# Patient Record
Sex: Male | Born: 1935 | Race: White | Hispanic: No | Marital: Married | Smoking: Never smoker
Health system: Southern US, Community
[De-identification: ages and names within clinical notes are randomized; demographics above are authoritative.]

## PROBLEM LIST (undated history)

## (undated) DIAGNOSIS — I10 Essential (primary) hypertension: Secondary | ICD-10-CM

## (undated) DIAGNOSIS — C801 Malignant (primary) neoplasm, unspecified: Secondary | ICD-10-CM

## (undated) DIAGNOSIS — I2510 Atherosclerotic heart disease of native coronary artery without angina pectoris: Secondary | ICD-10-CM

## (undated) DIAGNOSIS — E119 Type 2 diabetes mellitus without complications: Secondary | ICD-10-CM

## (undated) DIAGNOSIS — D649 Anemia, unspecified: Secondary | ICD-10-CM

## (undated) DIAGNOSIS — N189 Chronic kidney disease, unspecified: Secondary | ICD-10-CM

## (undated) HISTORY — PX: CAROTID ENDARTERECTOMY: SUR193

---

## 1993-01-20 DIAGNOSIS — J189 Pneumonia, unspecified organism: Secondary | ICD-10-CM

## 1993-01-20 DIAGNOSIS — I219 Acute myocardial infarction, unspecified: Secondary | ICD-10-CM

## 1993-01-20 HISTORY — DX: Pneumonia, unspecified organism: J18.9

## 1993-01-20 HISTORY — DX: Acute myocardial infarction, unspecified: I21.9

## 1993-01-20 HISTORY — PX: CORONARY ARTERY BYPASS GRAFT: SHX141

## 2004-10-09 ENCOUNTER — Ambulatory Visit (HOSPITAL_COMMUNITY): Admission: RE | Admit: 2004-10-09 | Discharge: 2004-10-09 | Payer: Self-pay | Admitting: Neurosurgery

## 2005-03-26 ENCOUNTER — Ambulatory Visit (HOSPITAL_COMMUNITY): Admission: RE | Admit: 2005-03-26 | Discharge: 2005-03-26 | Payer: Self-pay | Admitting: Neurosurgery

## 2005-03-28 ENCOUNTER — Ambulatory Visit (HOSPITAL_COMMUNITY): Admission: RE | Admit: 2005-03-28 | Discharge: 2005-03-28 | Payer: Self-pay | Admitting: Neurosurgery

## 2005-04-03 ENCOUNTER — Inpatient Hospital Stay (HOSPITAL_COMMUNITY): Admission: RE | Admit: 2005-04-03 | Discharge: 2005-04-07 | Payer: Self-pay | Admitting: Neurosurgery

## 2005-04-05 ENCOUNTER — Ambulatory Visit: Payer: Self-pay | Admitting: Emergency Medicine

## 2005-12-05 ENCOUNTER — Encounter: Admission: RE | Admit: 2005-12-05 | Discharge: 2005-12-05 | Payer: Self-pay | Admitting: Neurosurgery

## 2007-05-25 IMAGING — CR DG CHEST 2V
2 series · 2 of 2 positions shown · non-contrast
Comparison: none

CLINICAL DATA: Spondylosis

Chest 2 view:
No previous for comparison. Prominent interstitial markings in the lung bases.
Changes of CABG. No confluent airspace infiltrate. Mild apparent enlargement of
cardiac silhouette. No effusion. Tortuous thoracic aorta.
Pressure:
1. Mild cardiomegaly.
2. Changes of CABG.

[view not recorded (1 of 2)]
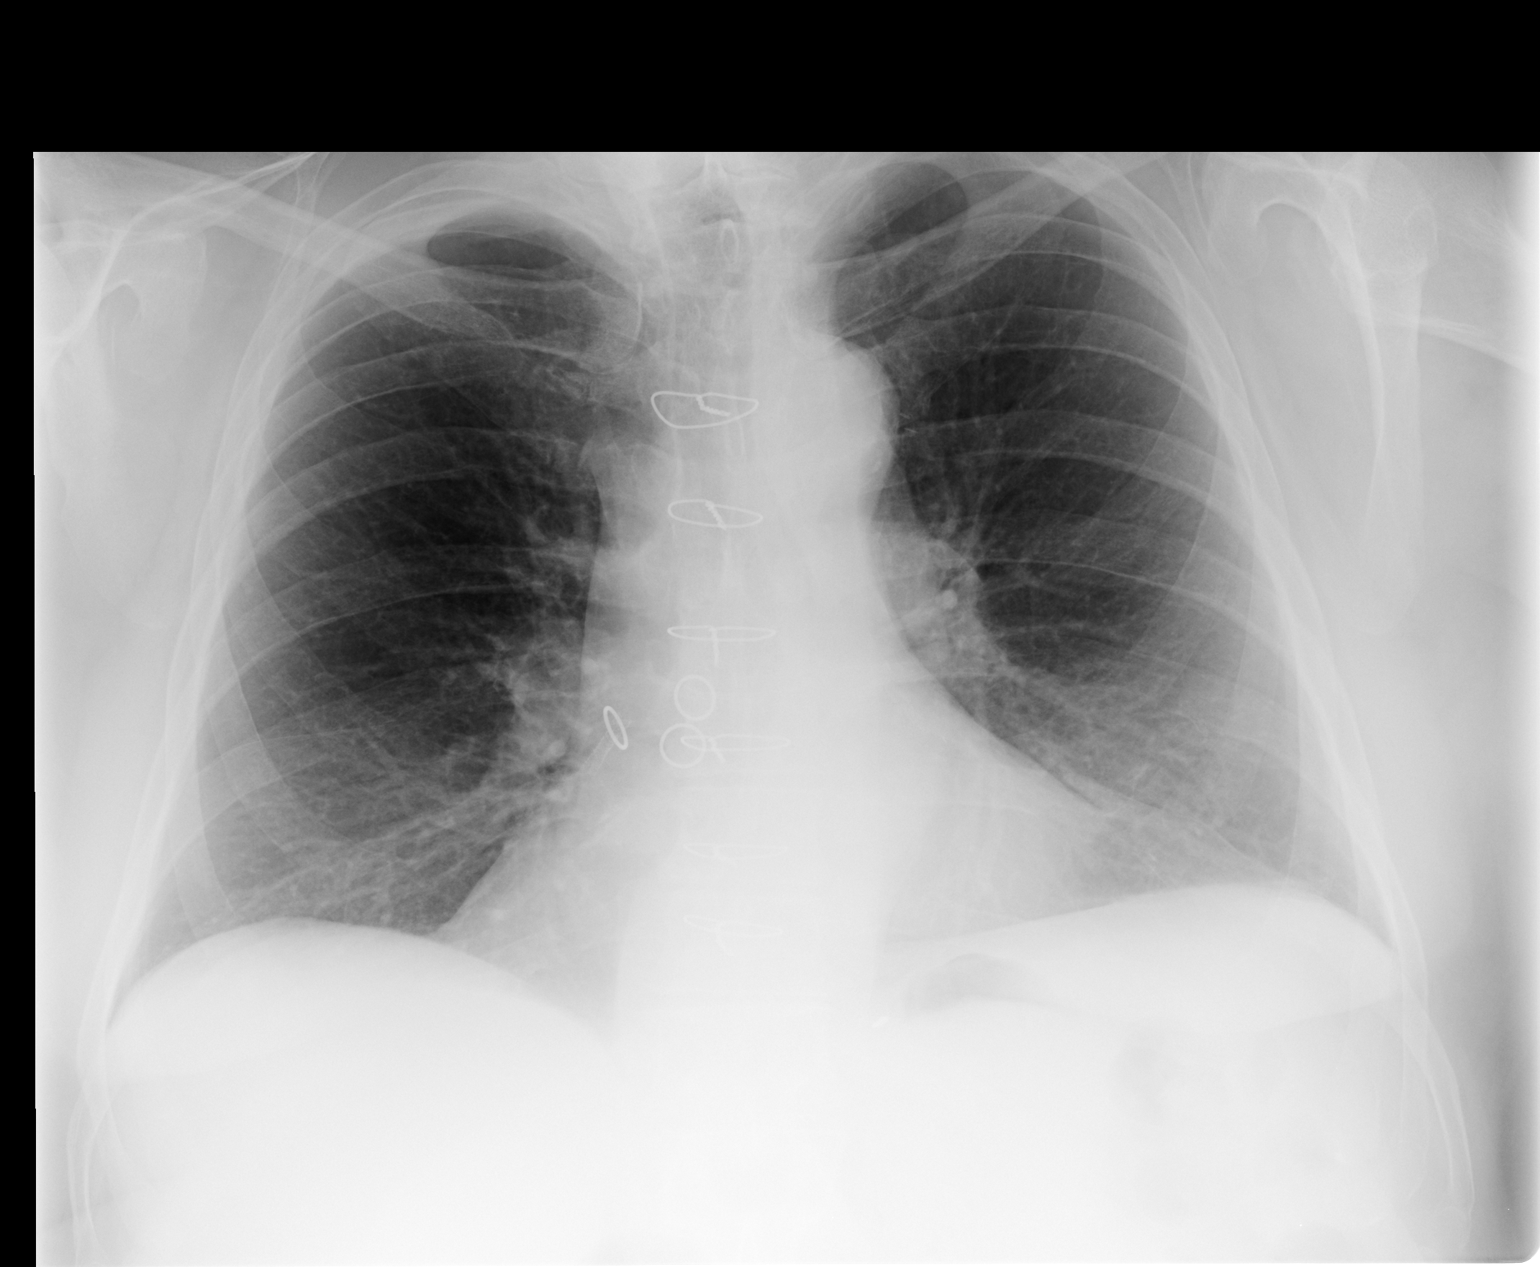

[view not recorded (2 of 2)]
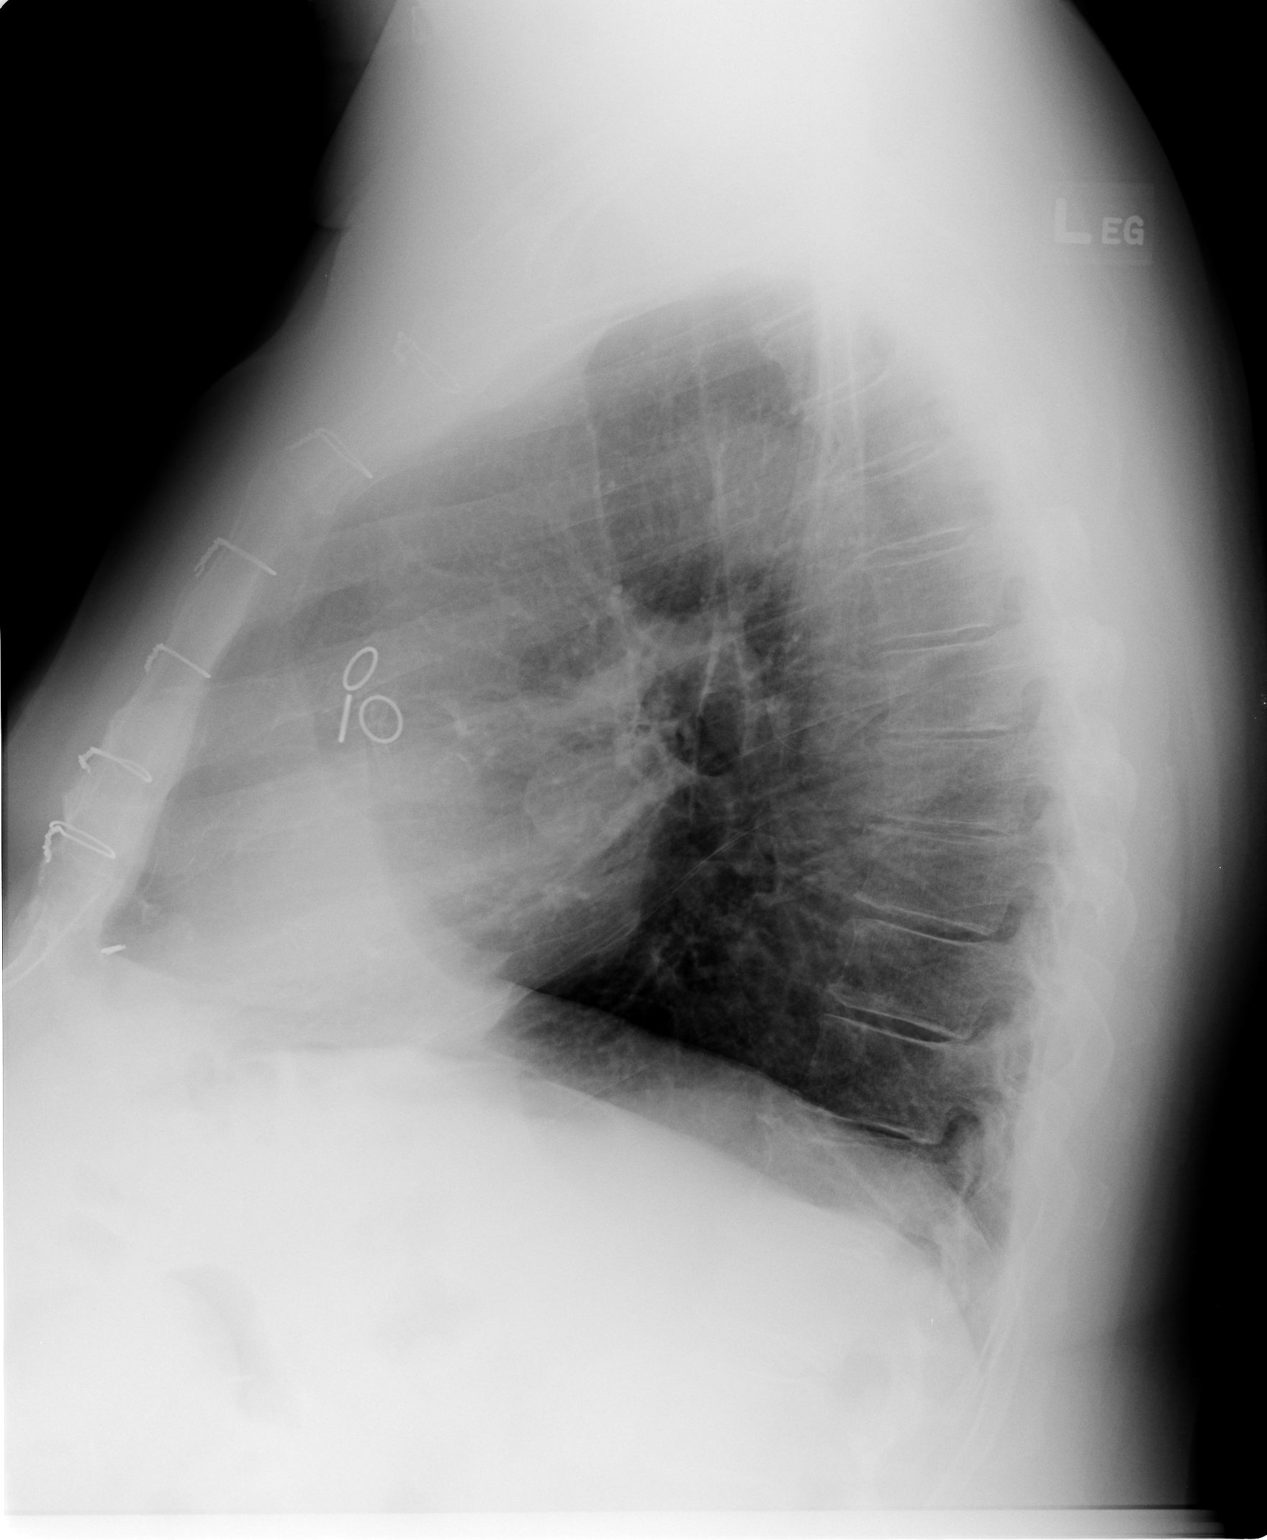

[2 of 2 positions shown; findings below may reference images not displayed]

## 2010-01-20 DIAGNOSIS — G4730 Sleep apnea, unspecified: Secondary | ICD-10-CM | POA: Diagnosis present

## 2010-01-20 HISTORY — PX: BACK SURGERY: SHX140

## 2010-01-20 HISTORY — DX: Sleep apnea, unspecified: G47.30

## 2010-01-20 HISTORY — PX: CORONARY ANGIOPLASTY: SHX604

## 2014-05-17 ENCOUNTER — Other Ambulatory Visit: Payer: Self-pay | Admitting: Neurosurgery

## 2014-05-17 DIAGNOSIS — Z981 Arthrodesis status: Secondary | ICD-10-CM

## 2014-05-31 ENCOUNTER — Ambulatory Visit
Admission: RE | Admit: 2014-05-31 | Discharge: 2014-05-31 | Disposition: A | Discharge status: Home / Self Care | Payer: Medicare Other | Source: Ambulatory Visit | Attending: Neurosurgery | Admitting: Neurosurgery

## 2014-05-31 DIAGNOSIS — Z981 Arthrodesis status: Secondary | ICD-10-CM

## 2016-01-21 DIAGNOSIS — I209 Angina pectoris, unspecified: Secondary | ICD-10-CM

## 2016-01-21 HISTORY — PX: EYE SURGERY: SHX253

## 2016-01-21 HISTORY — DX: Angina pectoris, unspecified: I20.9

## 2019-05-19 MED ORDER — FLUCONAZOLE 50 MG PO TABS
100.00 | ORAL_TABLET | ORAL | Status: DC
Start: 2019-05-19 — End: 2019-05-19

## 2019-05-19 MED ORDER — CYANOCOBALAMIN 1000 MCG PO TABS
1000.00 | ORAL_TABLET | ORAL | Status: DC
Start: 2019-05-19 — End: 2019-05-19

## 2019-05-19 MED ORDER — INSULIN LISPRO 100 UNIT/ML ~~LOC~~ SOLN
1.00 | SUBCUTANEOUS | Status: DC
Start: 2019-05-18 — End: 2019-05-19

## 2019-05-19 MED ORDER — DEXTROSE 10 % IV SOLN
125.00 | INTRAVENOUS | Status: DC
Start: ? — End: 2019-05-19

## 2019-05-19 MED ORDER — FLUTICASONE FUROATE-VILANTEROL 100-25 MCG/INH IN AEPB
1.00 | INHALATION_SPRAY | RESPIRATORY_TRACT | Status: DC
Start: 2019-05-19 — End: 2019-05-19

## 2019-05-19 MED ORDER — ISOSORBIDE MONONITRATE ER 30 MG PO TB24
30.00 | ORAL_TABLET | ORAL | Status: DC
Start: 2019-05-19 — End: 2019-05-19

## 2019-05-19 MED ORDER — GENERIC EXTERNAL MEDICATION
Status: DC
Start: ? — End: 2019-05-19

## 2019-05-19 MED ORDER — GABAPENTIN 300 MG PO CAPS
300.00 | ORAL_CAPSULE | ORAL | Status: DC
Start: 2019-05-19 — End: 2019-05-19

## 2019-05-19 MED ORDER — NITROGLYCERIN 0.4 MG SL SUBL
0.40 | SUBLINGUAL_TABLET | SUBLINGUAL | Status: DC
Start: ? — End: 2019-05-19

## 2019-05-19 MED ORDER — ACETAMINOPHEN 325 MG PO TABS
650.00 | ORAL_TABLET | ORAL | Status: DC
Start: ? — End: 2019-05-19

## 2019-05-19 MED ORDER — GLUCOSE 40 % PO GEL
15.00 | ORAL | Status: DC
Start: ? — End: 2019-05-19

## 2019-05-19 MED ORDER — PRAVASTATIN SODIUM 40 MG PO TABS
40.00 | ORAL_TABLET | ORAL | Status: DC
Start: 2019-05-18 — End: 2019-05-19

## 2019-05-19 MED ORDER — METOPROLOL SUCCINATE ER 50 MG PO TB24
50.00 | ORAL_TABLET | ORAL | Status: DC
Start: 2019-05-19 — End: 2019-05-19

## 2019-05-19 MED ORDER — DULOXETINE HCL 30 MG PO CPEP
60.00 | ORAL_CAPSULE | ORAL | Status: DC
Start: 2019-05-19 — End: 2019-05-19

## 2019-05-19 MED ORDER — FLUTICASONE PROPIONATE 50 MCG/ACT NA SUSP
1.00 | NASAL | Status: DC
Start: 2019-05-18 — End: 2019-05-19

## 2019-05-19 MED ORDER — GLUCAGON (RDNA) 1 MG IJ KIT
1.00 | PACK | INTRAMUSCULAR | Status: DC
Start: ? — End: 2019-05-19

## 2019-05-19 MED ORDER — PANTOPRAZOLE SODIUM 40 MG IV SOLR
40.00 | INTRAVENOUS | Status: DC
Start: 2019-05-18 — End: 2019-05-19

## 2019-05-19 MED ORDER — GABAPENTIN 300 MG PO CAPS
600.00 | ORAL_CAPSULE | ORAL | Status: DC
Start: 2019-05-18 — End: 2019-05-19

## 2020-08-22 ENCOUNTER — Other Ambulatory Visit: Payer: Self-pay | Admitting: Neurosurgery

## 2020-09-11 NOTE — Pre-Procedure Instructions (Signed)
Surgical Instructions   Your procedure is scheduled on Tuesday, August 30th. Report to [facility] Main Entrance "A" at 09:20 A.M., then check in with the Admitting office. Call this number if you have problems the morning of surgery: [phone]   If you have any questions prior to your surgery date call [phone]: Open Monday-Friday 8am-4pm   Remember: Do not eat or drink after midnight the night before your surgery     Take these medicines the morning of surgery with A SIP OF WATER  buPROPion (WELLBUTRIN XL)  DULoxetine (CYMBALTA)   WHAT DO I DO ABOUT MY DIABETES MEDICATION?   Do not take JARDIANCE on 8/29 or 8/30 (morning of surgery.)   HOW TO MANAGE YOUR DIABETES BEFORE AND AFTER SURGERY  Why is it important to control my blood sugar before and after surgery? Improving blood sugar levels before and after surgery helps healing and can limit problems. A way of improving blood sugar control is eating a healthy diet by:  Eating less sugar and carbohydrates  Increasing activity/exercise  Talking with your doctor about reaching your blood sugar goals High blood sugars (greater than 180 mg/dL) can raise your risk of infections and slow your recovery, so you will need to focus on controlling your diabetes during the weeks before surgery. Make sure that the doctor who takes care of your diabetes knows about your planned surgery including the date and location.  How do I manage my blood sugar before surgery? Check your blood sugar at least 4 times a day, starting 2 days before surgery, to make sure that the level is not too high or low.  Check your blood sugar the morning of your surgery when you wake up and every 2 hours until you get to the Short Stay unit.  If your blood sugar is less than 70 mg/dL, you will need to treat for low blood sugar: Do not take insulin. Treat a low blood sugar (less than 70 mg/dL) with  cup of clear juice (cranberry or apple), 4 glucose tablets,  OR glucose gel. Recheck blood sugar in 15 minutes after treatment (to make sure it is greater than 70 mg/dL). If your blood sugar is not greater than 70 mg/dL on recheck, call [phone] for further instructions. Report your blood sugar to the short stay nurse when you get to Short Stay.  If you are admitted to the hospital after surgery: Your blood sugar will be checked by the staff and you will probably be given insulin after surgery (instead of oral diabetes medicines) to make sure you have good blood sugar levels. The goal for blood sugar control after surgery is 80-180 mg/dL.    As of today, STOP taking any Aspirin (unless otherwise instructed by your surgeon) Aleve, Naproxen, Ibuprofen, Motrin, Advil, Goody's, BC's, all herbal medications, fish oil, and all vitamins.          Do not wear jewelry  Do not wear lotions, powders, colognes, or deodorant. Men may shave face and neck. Do not bring valuables to the hospital. [facility] is not responsible for any belongings or valuables.          Do NOT Smoke (Tobacco/Vaping) or drink Alcohol 24 hours prior to your procedure If you use a CPAP at night, you may bring all equipment for your overnight stay.   Contacts, glasses, dentures or bridgework may not be worn into surgery, please bring cases for these belongings   For patients admitted to the hospital, discharge time  will be determined by your treatment team.   Patients discharged the day of surgery will not be allowed to drive home, and someone needs to stay with them for 24 hours.  ONLY 1 SUPPORT PERSON MAY BE PRESENT WHILE YOU ARE IN SURGERY. IF YOU ARE TO BE ADMITTED ONCE YOU ARE IN YOUR ROOM YOU WILL BE ALLOWED TWO (2) VISITORS.  Minor children may have two parents present. Special consideration for safety and communication needs will be reviewed on a case by case basis.  Special instructions:  Oral Hygiene is also important to reduce your risk of infection.  Remember - BRUSH  YOUR TEETH THE MORNING OF SURGERY WITH YOUR REGULAR TOOTHPASTE   [facility]- Preparing For Surgery  Before surgery, you can play an important role. Because skin is not sterile, your skin needs to be as free of germs as possible. You can reduce the number of germs on your skin by washing with CHG (chlorahexidine gluconate) Soap before surgery.  CHG is an antiseptic cleaner which kills germs and bonds with the skin to continue killing germs even after washing.     Please do not use if you have an allergy to CHG or antibacterial soaps. If your skin becomes reddened/irritated stop using the CHG.  Do not shave (including legs and underarms) for at least 48 hours prior to first CHG shower. It is OK to shave your face.  Please follow these instructions carefully.     Shower the NIGHT BEFORE SURGERY and the MORNING OF SURGERY with CHG Soap.   If you chose to wash your hair, wash your hair first as usual with your normal shampoo. After you shampoo, rinse your hair and body thoroughly to remove the shampoo.  Then [employer] and genitals (private parts) with your normal soap and rinse thoroughly to remove soap.  After that Use CHG Soap as you would any other liquid soap. You can apply CHG directly to the skin and wash gently with a scrungie or a clean washcloth.   Apply the CHG Soap to your body ONLY FROM THE NECK DOWN.  Do not use on open wounds or open sores. Avoid contact with your eyes, ears, mouth and genitals (private parts). Wash Face and genitals (private parts)  with your normal soap.   Wash thoroughly, paying special attention to the area where your surgery will be performed.  Thoroughly rinse your body with warm water from the neck down.  DO NOT shower/wash with your normal soap after using and rinsing off the CHG Soap.  Pat yourself dry with a CLEAN TOWEL.  Wear CLEAN PAJAMAS to bed the night before surgery  Place CLEAN SHEETS on your bed the night before your surgery  DO NOT SLEEP  WITH PETS.   Day of Surgery:  Take a shower with CHG soap. Wear Clean/Comfortable clothing the morning of surgery Do not apply any deodorants/lotions.   Remember to brush your teeth WITH YOUR REGULAR TOOTHPASTE.   Please read over the following fact sheets that you were given.

## 2020-09-12 ENCOUNTER — Encounter (HOSPITAL_COMMUNITY): Payer: Self-pay

## 2020-09-12 ENCOUNTER — Other Ambulatory Visit: Payer: Self-pay

## 2020-09-12 ENCOUNTER — Encounter (HOSPITAL_COMMUNITY)
Admission: RE | Admit: 2020-09-12 | Discharge: 2020-09-12 | Disposition: A | Discharge status: Home / Self Care | Payer: Medicare Other | Source: Ambulatory Visit | Attending: Neurosurgery | Admitting: Neurosurgery

## 2020-09-12 DIAGNOSIS — M4726 Other spondylosis with radiculopathy, lumbar region: Secondary | ICD-10-CM | POA: Insufficient documentation

## 2020-09-12 DIAGNOSIS — Z01818 Encounter for other preprocedural examination: Secondary | ICD-10-CM | POA: Insufficient documentation

## 2020-09-12 HISTORY — DX: Atherosclerotic heart disease of native coronary artery without angina pectoris: I25.10

## 2020-09-12 HISTORY — DX: Anemia, unspecified: D64.9

## 2020-09-12 HISTORY — DX: Essential (primary) hypertension: I10

## 2020-09-12 HISTORY — DX: Type 2 diabetes mellitus without complications: E11.9

## 2020-09-12 HISTORY — DX: Chronic kidney disease, unspecified: N18.9

## 2020-09-12 HISTORY — DX: Malignant (primary) neoplasm, unspecified: C80.1

## 2020-09-12 LAB — BASIC METABOLIC PANEL
Anion gap: 8 (ref 5–15)
BUN: 34 mg/dL — ABNORMAL HIGH (ref 8–23)
CO2: 23 mmol/L (ref 22–32)
Calcium: 8.8 mg/dL — ABNORMAL LOW (ref 8.9–10.3)
Chloride: 105 mmol/L (ref 98–111)
Creatinine, Ser: 2.46 mg/dL — ABNORMAL HIGH (ref 0.61–1.24)
GFR, Estimated: 25 mL/min — ABNORMAL LOW (ref >60)
Glucose, Bld: 89 mg/dL (ref 70–99)
Potassium: 5.7 mmol/L — ABNORMAL HIGH (ref 3.5–5.1)
Sodium: 136 mmol/L (ref 135–145)

## 2020-09-12 LAB — CBC
HCT: 34.2 % — ABNORMAL LOW (ref 39.0–52.0)
Hemoglobin: 10.7 g/dL — ABNORMAL LOW (ref 13.0–17.0)
MCH: 31.2 pg (ref 26.0–34.0)
MCHC: 31.3 g/dL (ref 30.0–36.0)
MCV: 99.7 fL (ref 80.0–100.0)
Platelets: 211 10*3/uL (ref 150–400)
RBC: 3.43 MIL/uL — ABNORMAL LOW (ref 4.22–5.81)
RDW: 13.2 % (ref 11.5–15.5)
WBC: 7.3 10*3/uL (ref 4.0–10.5)
nRBC: 0 % (ref 0.0–0.2)

## 2020-09-12 LAB — SURGICAL PCR SCREEN
MRSA, PCR: NEGATIVE
Staphylococcus aureus: POSITIVE — AB

## 2020-09-12 LAB — GLUCOSE, CAPILLARY: Glucose-Capillary: 102 mg/dL — ABNORMAL HIGH (ref 70–99)

## 2020-09-12 LAB — HEMOGLOBIN A1C
Hgb A1c MFr Bld: 8.4 % — ABNORMAL HIGH (ref 4.8–5.6)
Mean Plasma Glucose: 194.38 mg/dL

## 2020-09-12 NOTE — Progress Notes (Signed)
PCP - Dr. Maryjean Ka Cardiologist - Dr. Gwenith Spitz  PPM/ICD - denies   Chest x-ray - 04/30/20 per pt, records requested from [facility] EKG - 09/12/20 at PAT appt Stress Test - 11/20/15 ECHO -  Cardiac Cath - 07/15/16  Sleep Study - pt says he had one 8-10 years ago CPAP - yes, every night  DM: Type 2 Fasting Blood Sugar - 110 Checks Blood Sugar 2 times a day  Blood Thinner Instructions: per pt, provider instructed him to hold Plavix for 7 days prior to surgery   ERAS Protcol - No  COVID TEST- pt instructed to go to testing center on 8/26   Anesthesia review: yes, Cardiac hx  Patient denies shortness of breath, fever, cough and chest pain at PAT appointment   All instructions explained to the patient, with a verbal understanding of the material. Patient agrees to go over the instructions while at home for a better understanding. The opportunity to ask questions was provided.

## 2020-09-12 NOTE — Progress Notes (Signed)
No cardiac clearance on file for this pt. Ebony Hail notified. She will contact surgeon's office.

## 2020-09-13 ENCOUNTER — Encounter (HOSPITAL_COMMUNITY): Payer: Self-pay

## 2020-09-13 NOTE — Progress Notes (Addendum)
Anesthesia Chart Review:  Case: [id number] Date/Time: 09/18/20 1106   Procedure: PLIF L2-3, L3-4   Anesthesia type: General   Pre-op diagnosis: OSTEOARTHRITIS OF SPINE WITH RADICULOPATHY, LUMBAR REGION   Location: [facility] OR ROOM 21 / [facility]   Surgeons: Ricky Lose, MD       DISCUSSION: Patient is an 85 year old male scheduled for the above procedure.   History includes never smoker, CAD (MI s/Ricky CABG 11/22/17: "SVG=RCA, LIMA-LAD, SVG=Marg 1"; 07/21/16 s/Ricky DES to [facility] for occluded SVG-RCA, LIMA-LAD patent, SVGs CTO), HTN, OSA (uses CPAP), DM2, bladder cancer (s/Ricky multiple BCG instillations 2017-2019), CKD (stage III-IV), carotid artery stenosis (s/Ricky right carotid enterectomy 2004), anemia, back surgery (fusion 1992). Notes indicate former alcohol abuse, but in remission. S/Ricky right cataract extraction 08/21/20.  Patient last evaluated by his cardiologist Dr. Atilano Nelson on 07/04/20.  He notes patient with history of stable moderate chronic angina pectoris.  He states patient's angina may occur when he walks at a more rapid pace or up an incline, but does not usually limit his activities.  No angina at rest or with minimal activity.  He notes a decrease in his Nitro use (per 04/30/20 visit, patient had reported using ~ 45 Nitro tablets since Thanksgiving 2021, and Imdur increased from 30 mg to 60 mg). He did trial an increase of Imdur from 60 to 90 mg daily, but told patient if it did not improve his symptoms then to resume 60 mg.  He did want him to continue daily beta-blocker.  42-monthfollow-up planned in last he should develop new or worsening symptoms. Cardiology notes indicate that his last PCI in 2018 was difficult "[place] - Dr. CAtilano Nelson a clearance letter on 08/22/20 indicating patient was considered "Low Risk" with permission to hold ASA and Plavix 5-7 days.  - Urologist Dr. MVenia Nelson signed a clearance letter.  - Last visit with [facility] nephrologist  Dr. SFelicity Pellegrini5/25/22 for follow-up CKD stage III with AKI 04/2019 in setting of GI bleed. Per 06/06/20 labs, . Creatinine 1.92, K 5.3. Advised to limit dietary potassium. Follow-up in six months. More recent [facility] Dr. PJennette Bankerreviewed 09/10/20 labs showed BUN 39, Creatinine 2.550, eGFR 24, K 6.2. Kayexalate and Lokelma prescriptions called in. Advised to increase fluid intake and repeat BMET in 4-7 days.  I've left a message for patient to contact me to confirm no new or progressive CV symptoms and to inquire if he has had [facility] regarding recent increase in Creatinine and potassium levels over the past 2-3 months. We will need preoperative input from nephrology as well. Ricky Nelson at Dr. NCleotilde Neeroffice notified.    Patient is scheduled for preoperative COVID-19 testing on 09/14/2020.  ADDENDUM 09/14/20 12:36 PM: I spoke with Ricky Nelson He confirmed is taking the Imdur 90 mg. He says this dose has continued to help with his symptoms and lessen his frequency of Nitro, last use ~ 3 weeks ago. He denies chest pain at rest. He says he can walk up a flight of stairs without chest pain. He said fast paced walking down and back his driveway which is ~ [id number] may bring on throat tightening for which he will take one Nitro with relief. This is how he says his angina exhibits itself. Again he feels symptoms are stable and even with some  improvement since his June visit with Dr. Atilano Nelson. He has chronic DOE with exertion which he feels is stable. He also confirmed CEA was done on the right side only.  In regards to his CKD. He said he received the Rx for Kayexalate on 09/13/20 and took a dose then and will take the second dose today. I told him I will follow-up with Dr. Jennette Nelson to inquire about any additional recommendations. If renal function not rechecked, then will plan to repeat  on the day of surgery given hyperkalemia and CKD history. Reviewed available information with anesthesiologist Ricky Gammon, MD.  ADDENDUM 09/17/20 4:42 PM: I left a voice message with Ricky Nelson [phone], ext [phone] phone; fax [phone]), nurse with nephrologist Dr. Jennette Nelson on 09/14/20 regarding 09/12/20 lab results and that patient had taken Kayexalate as recommended. I did not receive a call back, but Ricky Nelson at Dr. Cleotilde Neer office spoke with her this afternoon. She indicated that Dr. Jennette Nelson had been out of town. She would fax available records. Apparently, he is back on 09/18/20, so may be able to reach him then if needed. Discussed renal function/hyperkalemia with anesthesiologist Ricky Battiest, MD. Will plan for STAT BMET on arrival. Definitive plan following review or labs and patient evaluation. UPDATE 09/18/20 9:46 AM: Received nephrology input from Dr. Jennette Nelson. He classified patient as "Moderate Risk". He added, "Patient's potassium was 6.2, S. Creatinine 2.5 (baseline 2.0) on labs on 09/10/20. He was prescribed kayexalate and Lokelma to help hyperkalemia. Was supposed to repeat BMP last week, but not done at [place]. Please verify if his potassium normalized before surgery. Thank you." Patient has a BMET ordered for day of surgery, but results are pending.    VS: BP (!) 137/96   Pulse 72   Temp 36.7 C   Resp 18   Ht _0  (1.727 m)   Wt 98.2 kg   SpO2 97%   BMI 32.90 kg/m    PROVIDERS: Ricky Ka, MD is local PCP Ricky Nelson), last visit 07/18/20 for follow-up chronic medical conditions. He also receives primary care through the [facility], Ricky Mellow, MD, last visit 08/24/20 and notes plans for upcoming back surgery.  Ricky Lick, MD is cardiologist ([facility]), last visit 07/04/20.  Ricky Squires, MD is nephrologist with the [facility]. Access to records in [place] is limited, but appears last visit was 05/31/20. Overton Mam,  MD is nephrologist [facility]), last visit 06/13/20.  Rosario Adie, MD is urologist. Last visit 08/31/20  for follow-oup bladder cancer and TD (s/Ricky Testopel implant). See [place]. Carlean Jews, MD is HEM-ONC with [place] [facility]. Access to records in [place] is limted, but appears last encounter or lab visit was on 08/30/20. 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 Stacie Acres, MD is ophthalmologist   LABS: Preoperative labs noted. See DISCUSSION. A1c improved to 8.4%, down from 10.1% on 07/08/20 ([facility]) (all labs ordered are listed, but only abnormal results are displayed)  Labs Reviewed  SURGICAL PCR SCREEN - Abnormal; Notable for the following components:      Result Value   Staphylococcus aureus POSITIVE (*)    All other components within normal limits  GLUCOSE, CAPILLARY - Abnormal; Notable for the following components:   Glucose-Capillary 102 (*)    All other components within normal limits  BASIC METABOLIC PANEL - Abnormal; Notable for the following components:   Potassium 5.7 (*)    BUN  34 (*)    Creatinine, Ser 2.46 (*)    Calcium 8.8 (*)    GFR, Estimated 25 (*)    All other components within normal limits  CBC - Abnormal; Notable for the following components:   RBC 3.43 (*)    Hemoglobin 10.7 (*)    HCT 34.2 (*)    All other components within normal limits  HEMOGLOBIN A1C - Abnormal; Notable for the following components:   Hgb A1c MFr Bld 8.4 (*)    All other components within normal limits  TYPE AND SCREEN    Comparison Renal Labs:  [facility]: 09/10/20: BUN 39, Creatinine 2.55, eGFR 24, K 6.2 08/30/20: BUN 42, Creatinine 2.66, eGFR 23, K 5.3 06/06/20: BUN 26, Creatinine 1.92, eGFR 34, K 5.3  [facility]:   07/18/20: BUN 25, Creatinine 1.89, eGFR 35, K 6.0   06/06/20: BUN 26, Creatinine 1.92, eGFR 34, K 5.3   04/01/20: BUN 27, Creatinine 2.09, eGFR 31, K 4.6   03/23/20: BUN 23, Creatinine 1.64, eGFR 42,  K 4.3 01/03/20: BUN 24, Creatinine 1.76, eGFR 35, K 5.4   IMAGES: CXR 04/30/20 ([facility]): FINDINGS:  Cardiac shadow is stable. Postsurgical changes are again seen. Mild  atelectasis remains in the right lung base medially. This may be  chronic in nature given its stability from the prior exam. No new  focal infiltrate is seen. Degenerative changes of the thoracic spine  are noted.  IMPRESSION:  Likely chronic scarring in the right base.  No other focal abnormality is noted.    MRI L-spine 11/01/19 ([facility]): IMPRESSION: 1. A transitional lumbosacral vertebra is assumed to represent the L5 level, based on counting from the cervical spine utilizing MRI obtained on December 05, 2005. A rudimentary disc is seen between L5 and S1. Careful correlation with this numbering strategy prior to any procedural intervention would be recommended. 2. Multilevel degenerative changes of the lumbar spine with severe spinal canal stenosis at L3-4 and moderate spinal canal stenosis at L2-3. 3. Moderate bilateral neural foraminal narrowing at L1-2, L2-3 and L3-4.   EKG:  EKG 09/12/20:  Normal sinus rhythm Left axis deviation Right bundle branch block Abnormal ECG Confirmed by Dixie Dials ([id number]) on 09/13/2020 8:36:54 AM  EKG 04/30/20 (Dr. Atilano Nelson):  Sinus rhythm with sinus arrhythmia Left axis deviation Right bundle branch block When compared with ECG of 04/28/2019 16:11, No significant change was found Confirmed by Gabrielle Dare, MD on 05/01/2020 3:23:59 PM   CV: PCI 07/21/16 ([facility]): Angiographic Findings  Cardiac Arteries and Lesion Findings  LAD: Lesion on Dist LAD: Mid subsection.90% stenosis 8 mm length reduced to 0%.  Pre procedure TIMI III flow was noted. Post Procedure TIMI III flow was    present. Good run off was present. The lesion was diagnosed as Moderate    Risk (B).  Cardiac Grafts   -  There is a Vein graft that originates at the LIMA and attaches to the       Prox LAD. graft widely patent   Interventional Summary  Successful PCI / PTCA of the distal Left Anterior Descending Coronary Artery  Note: difficult case due to poor guide engagement from [place] or femoral    LHC 07/14/16 ([facility]): Angiographic findings  Cardiac Arteries and Lesion Findings  - LAD: Chronic occlusion.    Lesion on Prox LAD: Proximal subsection.100% stenosis 30 mm length . Pre    procedure TIMI 0 flow was noted. Poor run off was present.  Lesion on Dist LAD: Mid subsection.90% stenosis 5 mm length . Pre procedure    TIMI III flow was noted. Good run off was present. The lesion was diagnosed    as Moderate Risk (B).  - LCx: Normal.  - RCA: Chronic occlusion.    Lesion on Dist RCA: Mid subsection.100% stenosis 20 mm length . Pre    procedure TIMI 0 flow was noted. Poor run off was present. The lesion was    diagnosed as Moderate Risk (B).    Comments:collateralized by distal circ; no suitable angiographic    collaterals for retrograde attempt  - LMCA: Abnormal and Single stenosis.    Lesion on LMCA: Distal subsection.50% stenosis 5 mm length . Pre procedure    TIMI III flow was noted. Good run off was present. The lesion was diagnosed    as High Risk (C). Bifurcation lesion.  Cardiac Grafts   -  There is a LIMA graft that originates at the LIMA and attaches to the Mid      LAD.patent   -  There is a Vein graft that originates at the Aorta Right and attaches to      the R PDA.known CTO   Diagnostic Procedure Summary  Multivessel CAD.  Possible PCI targets: distal LAD via LIMA graft, distal RCA CTO  Patent LIMA, SVG's are CTO's   Diagnostic Procedure Recommendations  Elective return to lab for LAD and possible RCA PCI attempts to temporize his  severe exertional angina. Note: L radial access provides poor catheter angle  into LIMA origin; will use femoral approach for PCI access.   Echo (limited) 10/21/14 ([facility]): Summary  Limited study to  assess pericardial effusion.  Trace pericardial effusion seen -- no significant change from echo on  10/20/2014   Echo 10/16/14 ([facility]): Summary  Mild concentric left ventricular hypertrophy  Well preserved LV function with basal mid Inferior akinesis.  Ejection fraction is visually estimated at 50-55%  There is mild aortic sclerosis noted, with no evidence of stenosis.  No aortic stenosis. No aortic regurgitation.   Per 09/21/14 Vascular Surgery note by Dagoberto Ligas, PA-C/Cruz, Ashok Cordia, MD, carotid US showed: "Results  1-39% stenosis B ICA Known occluded R ECA"   Past Medical History:  Diagnosis Date   Anemia    Anginal pain ([place]) 2018   Cancer [facility])    bladder   Coronary artery disease    Diabetes mellitus without complication ([place])    type 2   Hypertension    Myocardial infarction ([place]) 1995   Pneumonia 1995   Sleep apnea 2012    Past Surgical History:  Procedure Laterality Date   BACK SURGERY  2012   CORONARY ANGIOPLASTY  2012   CORONARY ARTERY BYPASS GRAFT  1995   EYE SURGERY Bilateral 2018   cataracts, since removed    MEDICATIONS:  buPROPion (WELLBUTRIN XL) 150 MG 24 hr tablet   clopidogrel (PLAVIX) 75 MG tablet   Docusate Sodium (DSS) 100 MG CAPS   DULoxetine (CYMBALTA) 60 MG capsule   fluticasone (FLONASE) 50 MCG/ACT nasal spray   gabapentin (NEURONTIN) 300 MG capsule   glipiZIDE (GLUCOTROL) 10 MG tablet   insulin detemir (LEVEMIR) 100 UNIT/ML injection   Ipratropium-Albuterol (COMBIVENT RESPIMAT) 20-100 MCG/ACT AERS respimat   isosorbide mononitrate (IMDUR) 30 MG 24 hr tablet   JARDIANCE 10 MG TABS tablet   ketorolac (ACULAR) 0.5 % ophthalmic solution   liraglutide (VICTOZA) 18 MG/3ML SOPN   magnesium oxide (MAG-OX) 400 MG tablet  metoprolol succinate (TOPROL-XL) 50 MG 24 hr tablet   Multiple Vitamin (THERA-TABS PO)   nitroGLYCERIN (NITROSTAT) 0.4 MG SL tablet   omega-3 acid ethyl esters (LOVAZA) 1 g capsule   rosuvastatin (CRESTOR) 20 MG  tablet   triamcinolone cream (KENALOG) 0.1 %   vitamin B-12 (CYANOCOBALAMIN) 1000 MCG tablet   No current facility-administered medications for this encounter.    Myra Gianotti, PA-C Surgical Short Stay/Anesthesiology [facility] Phone [phone] [facility] Phone [phone] 09/13/2020 1:29 PM

## 2020-09-14 ENCOUNTER — Encounter (HOSPITAL_COMMUNITY): Payer: Self-pay

## 2020-09-14 ENCOUNTER — Other Ambulatory Visit: Payer: Self-pay | Admitting: Neurosurgery

## 2020-09-15 LAB — SARS CORONAVIRUS 2 (TAT 6-24 HRS): SARS Coronavirus 2: NEGATIVE

## 2020-09-17 NOTE — Anesthesia Preprocedure Evaluation (Addendum)
Anesthesia Evaluation  Patient identified by MRN, date of birth, ID band Patient awake    Reviewed: Allergy & Precautions, NPO status , Patient's Chart, lab work & pertinent test results, reviewed documented beta blocker date and time   History of Anesthesia Complications Negative for: history of anesthetic complications  Airway Mallampati: II  TM Distance: >3 FB Neck ROM: Full    Dental  (+) Edentulous Upper, Partial Lower, Dental Advisory Given   Pulmonary sleep apnea and Continuous Positive Airway Pressure Ventilation ,    Pulmonary exam normal        Cardiovascular hypertension, Pt. on medications and Pt. on home beta blockers + angina + CAD, + Past MI and + CABG  Normal cardiovascular exam  Patient last evaluated by his cardiologist Dr. Atilano Median on 07/04/20.  He notes patient with history of stable moderate chronic angina pectoris.  He states patient's angina may occur when he walks at a more rapid pace or up an incline, but does not usually limit his activities.  No angina at rest or with minimal activity.  He notes a decrease in his Nitro use (per 04/30/20 visit, patient had reported using ~ 45 Nitro tablets since Thanksgiving 2021, and Imdur increased from 30 mg to 60 mg). He did trial an increase of Imdur from 60 to 90 mg daily, but told patient if it did not improve his symptoms then to resume 60 mg.  He did want him to continue daily beta-blocker.  69-monthfollow-up planned in last he should develop new or worsening symptoms. Cardiology notes indicate that his last PCI in 2018 was difficult "due to poor guide engagement from [place]". - Dr. CAtilano Mediansigned a clearance letter on 08/22/20 indicating patient was considered "Low Risk" with permission to hold ASA and Plavix 5-7 days.    Neuro/Psych negative neurological ROS     GI/Hepatic negative GI ROS, Neg liver ROS,   Endo/Other  negative endocrine ROSdiabetes  Renal/GU Renal  InsufficiencyRenal disease     Musculoskeletal negative musculoskeletal ROS (+)   Abdominal   Peds  Hematology negative hematology ROS (+) anemia ,   Anesthesia Other Findings   Reproductive/Obstetrics                            Anesthesia Physical Anesthesia Plan  ASA: 3  Anesthesia Plan: General   Post-op Pain Management:    Induction: Intravenous  PONV Risk Score and Plan: 3 and Ondansetron, Dexamethasone and Diphenhydramine  Airway Management Planned: Oral ETT  Additional Equipment:   Intra-op Plan:   Post-operative Plan: Extubation in OR  Informed Consent: I have reviewed the patients History and Physical, chart, labs and discussed the procedure including the risks, benefits and alternatives for the proposed anesthesia with the patient or authorized representative who has indicated his/her understanding and acceptance.     Dental advisory given  Plan Discussed with: Anesthesiologist  Anesthesia Plan Comments: (See PAT note written 09/17/2020 by AMyra Gianotti PA-C. For STAT BMET on arrival. [facility]. Has chronic stable angina history with preoperative cardiology input.)       Anesthesia Quick Evaluation

## 2020-09-18 ENCOUNTER — Inpatient Hospital Stay (HOSPITAL_COMMUNITY): Payer: Medicare Other | Admitting: Vascular Surgery

## 2020-09-18 ENCOUNTER — Encounter (HOSPITAL_COMMUNITY): Payer: Self-pay | Admitting: Neurosurgery

## 2020-09-18 ENCOUNTER — Inpatient Hospital Stay (HOSPITAL_COMMUNITY)
Admission: RE | Admit: 2020-09-18 | Discharge: 2020-10-11 | DRG: 459 | Disposition: A | Discharge status: Skilled Nursing Facility | Payer: Medicare Other | Attending: Neurosurgery | Admitting: Neurosurgery

## 2020-09-18 ENCOUNTER — Inpatient Hospital Stay (HOSPITAL_COMMUNITY): Payer: Medicare Other

## 2020-09-18 ENCOUNTER — Other Ambulatory Visit: Payer: Self-pay

## 2020-09-18 ENCOUNTER — Encounter (HOSPITAL_COMMUNITY)
Admission: RE | Disposition: A | Discharge status: Skilled Nursing Facility | Payer: Self-pay | Source: Home / Self Care | Attending: Neurosurgery

## 2020-09-18 DIAGNOSIS — L22 Diaper dermatitis: Secondary | ICD-10-CM | POA: Diagnosis not present

## 2020-09-18 DIAGNOSIS — Z781 Physical restraint status: Secondary | ICD-10-CM

## 2020-09-18 DIAGNOSIS — I130 Hypertensive heart and chronic kidney disease with heart failure and stage 1 through stage 4 chronic kidney disease, or unspecified chronic kidney disease: Secondary | ICD-10-CM | POA: Diagnosis present

## 2020-09-18 DIAGNOSIS — G4733 Obstructive sleep apnea (adult) (pediatric): Secondary | ICD-10-CM | POA: Diagnosis present

## 2020-09-18 DIAGNOSIS — Z79899 Other long term (current) drug therapy: Secondary | ICD-10-CM

## 2020-09-18 DIAGNOSIS — M48062 Spinal stenosis, lumbar region with neurogenic claudication: Secondary | ICD-10-CM | POA: Diagnosis present

## 2020-09-18 DIAGNOSIS — K5900 Constipation, unspecified: Secondary | ICD-10-CM | POA: Diagnosis not present

## 2020-09-18 DIAGNOSIS — Z8551 Personal history of malignant neoplasm of bladder: Secondary | ICD-10-CM

## 2020-09-18 DIAGNOSIS — D509 Iron deficiency anemia, unspecified: Secondary | ICD-10-CM | POA: Diagnosis present

## 2020-09-18 DIAGNOSIS — N189 Chronic kidney disease, unspecified: Secondary | ICD-10-CM | POA: Diagnosis present

## 2020-09-18 DIAGNOSIS — J9601 Acute respiratory failure with hypoxia: Secondary | ICD-10-CM | POA: Diagnosis not present

## 2020-09-18 DIAGNOSIS — L259 Unspecified contact dermatitis, unspecified cause: Secondary | ICD-10-CM | POA: Diagnosis not present

## 2020-09-18 DIAGNOSIS — B372 Candidiasis of skin and nail: Secondary | ICD-10-CM | POA: Diagnosis not present

## 2020-09-18 DIAGNOSIS — I959 Hypotension, unspecified: Secondary | ICD-10-CM | POA: Diagnosis not present

## 2020-09-18 DIAGNOSIS — J440 Chronic obstructive pulmonary disease with acute lower respiratory infection: Secondary | ICD-10-CM | POA: Diagnosis not present

## 2020-09-18 DIAGNOSIS — R000 Tachycardia, unspecified: Secondary | ICD-10-CM | POA: Diagnosis not present

## 2020-09-18 DIAGNOSIS — B001 Herpesviral vesicular dermatitis: Secondary | ICD-10-CM | POA: Diagnosis not present

## 2020-09-18 DIAGNOSIS — Z9889 Other specified postprocedural states: Secondary | ICD-10-CM | POA: Diagnosis not present

## 2020-09-18 DIAGNOSIS — B002 Herpesviral gingivostomatitis and pharyngotonsillitis: Secondary | ICD-10-CM

## 2020-09-18 DIAGNOSIS — G4730 Sleep apnea, unspecified: Secondary | ICD-10-CM | POA: Diagnosis present

## 2020-09-18 DIAGNOSIS — N1832 Chronic kidney disease, stage 3b: Secondary | ICD-10-CM | POA: Diagnosis present

## 2020-09-18 DIAGNOSIS — B370 Candidal stomatitis: Secondary | ICD-10-CM | POA: Diagnosis present

## 2020-09-18 DIAGNOSIS — R9389 Abnormal findings on diagnostic imaging of other specified body structures: Secondary | ICD-10-CM

## 2020-09-18 DIAGNOSIS — N179 Acute kidney failure, unspecified: Secondary | ICD-10-CM | POA: Diagnosis present

## 2020-09-18 DIAGNOSIS — M4726 Other spondylosis with radiculopathy, lumbar region: Secondary | ICD-10-CM | POA: Diagnosis present

## 2020-09-18 DIAGNOSIS — D62 Acute posthemorrhagic anemia: Secondary | ICD-10-CM | POA: Diagnosis not present

## 2020-09-18 DIAGNOSIS — Z20822 Contact with and (suspected) exposure to covid-19: Secondary | ICD-10-CM | POA: Diagnosis present

## 2020-09-18 DIAGNOSIS — E1122 Type 2 diabetes mellitus with diabetic chronic kidney disease: Secondary | ICD-10-CM | POA: Diagnosis present

## 2020-09-18 DIAGNOSIS — G9341 Metabolic encephalopathy: Secondary | ICD-10-CM | POA: Diagnosis not present

## 2020-09-18 DIAGNOSIS — N529 Male erectile dysfunction, unspecified: Secondary | ICD-10-CM | POA: Diagnosis present

## 2020-09-18 DIAGNOSIS — R339 Retention of urine, unspecified: Secondary | ICD-10-CM | POA: Diagnosis not present

## 2020-09-18 DIAGNOSIS — J90 Pleural effusion, not elsewhere classified: Secondary | ICD-10-CM

## 2020-09-18 DIAGNOSIS — F32A Depression, unspecified: Secondary | ICD-10-CM | POA: Diagnosis present

## 2020-09-18 DIAGNOSIS — R410 Disorientation, unspecified: Secondary | ICD-10-CM | POA: Diagnosis not present

## 2020-09-18 DIAGNOSIS — Z951 Presence of aortocoronary bypass graft: Secondary | ICD-10-CM

## 2020-09-18 DIAGNOSIS — A4153 Sepsis due to Serratia: Secondary | ICD-10-CM | POA: Diagnosis not present

## 2020-09-18 DIAGNOSIS — Z419 Encounter for procedure for purposes other than remedying health state, unspecified: Secondary | ICD-10-CM

## 2020-09-18 DIAGNOSIS — L740 Miliaria rubra: Secondary | ICD-10-CM

## 2020-09-18 DIAGNOSIS — I2510 Atherosclerotic heart disease of native coronary artery without angina pectoris: Secondary | ICD-10-CM | POA: Diagnosis present

## 2020-09-18 DIAGNOSIS — R0902 Hypoxemia: Secondary | ICD-10-CM

## 2020-09-18 DIAGNOSIS — B009 Herpesviral infection, unspecified: Secondary | ICD-10-CM | POA: Diagnosis present

## 2020-09-18 DIAGNOSIS — G4710 Hypersomnia, unspecified: Secondary | ICD-10-CM | POA: Diagnosis not present

## 2020-09-18 DIAGNOSIS — R32 Unspecified urinary incontinence: Secondary | ICD-10-CM | POA: Diagnosis present

## 2020-09-18 DIAGNOSIS — Z967 Presence of other bone and tendon implants: Secondary | ICD-10-CM | POA: Diagnosis not present

## 2020-09-18 DIAGNOSIS — M48061 Spinal stenosis, lumbar region without neurogenic claudication: Secondary | ICD-10-CM | POA: Diagnosis present

## 2020-09-18 DIAGNOSIS — I252 Old myocardial infarction: Secondary | ICD-10-CM

## 2020-09-18 DIAGNOSIS — R6510 Systemic inflammatory response syndrome (SIRS) of non-infectious origin without acute organ dysfunction: Secondary | ICD-10-CM | POA: Diagnosis present

## 2020-09-18 DIAGNOSIS — E871 Hypo-osmolality and hyponatremia: Secondary | ICD-10-CM | POA: Diagnosis not present

## 2020-09-18 DIAGNOSIS — Z8701 Personal history of pneumonia (recurrent): Secondary | ICD-10-CM

## 2020-09-18 DIAGNOSIS — K120 Recurrent oral aphthae: Secondary | ICD-10-CM | POA: Diagnosis present

## 2020-09-18 DIAGNOSIS — E119 Type 2 diabetes mellitus without complications: Secondary | ICD-10-CM

## 2020-09-18 DIAGNOSIS — Z23 Encounter for immunization: Secondary | ICD-10-CM

## 2020-09-18 DIAGNOSIS — Z955 Presence of coronary angioplasty implant and graft: Secondary | ICD-10-CM

## 2020-09-18 DIAGNOSIS — J810 Acute pulmonary edema: Secondary | ICD-10-CM | POA: Diagnosis not present

## 2020-09-18 LAB — BASIC METABOLIC PANEL
Anion gap: 11 (ref 5–15)
BUN: 25 mg/dL — ABNORMAL HIGH (ref 8–23)
CO2: 25 mmol/L (ref 22–32)
Calcium: 8.7 mg/dL — ABNORMAL LOW (ref 8.9–10.3)
Chloride: 102 mmol/L (ref 98–111)
Creatinine, Ser: 1.87 mg/dL — ABNORMAL HIGH (ref 0.61–1.24)
GFR, Estimated: 35 mL/min — ABNORMAL LOW (ref >60)
Glucose, Bld: 104 mg/dL — ABNORMAL HIGH (ref 70–99)
Potassium: 4.2 mmol/L (ref 3.5–5.1)
Sodium: 138 mmol/L (ref 135–145)

## 2020-09-18 LAB — POCT I-STAT EG7
Acid-base deficit: 3 mmol/L — ABNORMAL HIGH (ref 0.0–2.0)
Bicarbonate: 24.5 mmol/L (ref 20.0–28.0)
Calcium, Ion: 1.17 mmol/L (ref 1.15–1.40)
HCT: 35 % — ABNORMAL LOW (ref 39.0–52.0)
Hemoglobin: 11.9 g/dL — ABNORMAL LOW (ref 13.0–17.0)
O2 Saturation: 76 %
Patient temperature: 36.6
Potassium: 4.8 mmol/L (ref 3.5–5.1)
Sodium: 140 mmol/L (ref 135–145)
TCO2: 26 mmol/L (ref 22–32)
pCO2, Ven: 52.2 mmHg (ref 44.0–60.0)
pH, Ven: 7.276 (ref 7.250–7.430)
pO2, Ven: 46 mmHg — ABNORMAL HIGH (ref 32.0–45.0)

## 2020-09-18 LAB — GLUCOSE, CAPILLARY
Glucose-Capillary: 110 mg/dL — ABNORMAL HIGH (ref 70–99)
Glucose-Capillary: 188 mg/dL — ABNORMAL HIGH (ref 70–99)
Glucose-Capillary: 211 mg/dL — ABNORMAL HIGH (ref 70–99)

## 2020-09-18 SURGERY — POSTERIOR LUMBAR FUSION 2 LEVEL
Anesthesia: General | Site: Spine Lumbar

## 2020-09-18 MED ORDER — FLUTICASONE PROPIONATE 50 MCG/ACT NA SUSP
1.0000 | Freq: Every day | NASAL | Status: DC | PRN
  Administered 2020-09-22 – 2020-10-08 (×3): 1 via NASAL
  Filled 2020-09-18: qty 16

## 2020-09-18 MED ORDER — ORAL CARE MOUTH RINSE: 15.0000 mL | Freq: Once | OROMUCOSAL | Status: AC

## 2020-09-18 MED ORDER — SUGAMMADEX SODIUM 200 MG/2ML IV SOLN
INTRAVENOUS | Status: DC | PRN
  Administered 2020-09-18: 200 mg via INTRAVENOUS

## 2020-09-18 MED ORDER — ADULT MULTIVITAMIN W/MINERALS CH
1.0000 | ORAL_TABLET | Freq: Every day | ORAL | Status: DC
  Administered 2020-09-19 – 2020-10-11 (×21): 1 via ORAL
  Filled 2020-09-18 (×22): qty 1

## 2020-09-18 MED ORDER — ACETAMINOPHEN 10 MG/ML IV SOLN
INTRAVENOUS | Status: DC | PRN
  Administered 2020-09-18: 1000 mg via INTRAVENOUS

## 2020-09-18 MED ORDER — INSULIN DETEMIR 100 UNIT/ML ~~LOC~~ SOLN
35.0000 [IU] | Freq: Every day | SUBCUTANEOUS | Status: DC
  Administered 2020-09-18 – 2020-09-24 (×7): 35 [IU] via SUBCUTANEOUS
  Filled 2020-09-18 (×11): qty 0.35

## 2020-09-18 MED ORDER — GABAPENTIN 300 MG PO CAPS
300.0000 mg | ORAL_CAPSULE | Freq: Three times a day (TID) | ORAL | Status: DC
  Administered 2020-09-18 – 2020-09-27 (×25): 300 mg via ORAL
  Filled 2020-09-18 (×25): qty 1

## 2020-09-18 MED ORDER — THROMBIN 5000 UNITS EX SOLR
CUTANEOUS | Status: AC
  Filled 2020-09-18: qty 5000

## 2020-09-18 MED ORDER — IPRATROPIUM-ALBUTEROL 0.5-2.5 (3) MG/3ML IN SOLN
3.0000 mL | Freq: Two times a day (BID) | RESPIRATORY_TRACT | Status: DC
  Administered 2020-09-19 – 2020-09-22 (×7): 3 mL via RESPIRATORY_TRACT
  Filled 2020-09-18 (×7): qty 3

## 2020-09-18 MED ORDER — CHLORHEXIDINE GLUCONATE 0.12 % MT SOLN
15.0000 mL | Freq: Once | OROMUCOSAL | Status: AC
  Administered 2020-09-18: 15 mL via OROMUCOSAL
  Filled 2020-09-18: qty 15

## 2020-09-18 MED ORDER — FENTANYL CITRATE (PF) 100 MCG/2ML IJ SOLN
INTRAMUSCULAR | Status: AC
  Filled 2020-09-18: qty 2

## 2020-09-18 MED ORDER — PHENYLEPHRINE 40 MCG/ML (10ML) SYRINGE FOR IV PUSH (FOR BLOOD PRESSURE SUPPORT)
PREFILLED_SYRINGE | INTRAVENOUS | Status: AC
  Filled 2020-09-18: qty 20

## 2020-09-18 MED ORDER — GLIPIZIDE 5 MG PO TABS
20.0000 mg | ORAL_TABLET | Freq: Two times a day (BID) | ORAL | Status: DC
  Administered 2020-09-19 – 2020-09-25 (×12): 20 mg via ORAL
  Filled 2020-09-18 (×14): qty 4

## 2020-09-18 MED ORDER — CEFAZOLIN SODIUM-DEXTROSE 2-4 GM/100ML-% IV SOLN
2.0000 g | Freq: Three times a day (TID) | INTRAVENOUS | Status: AC
  Administered 2020-09-18 – 2020-09-19 (×2): 2 g via INTRAVENOUS
  Filled 2020-09-18 (×2): qty 100

## 2020-09-18 MED ORDER — CHLORHEXIDINE GLUCONATE CLOTH 2 % EX PADS: 6.0000 | MEDICATED_PAD | Freq: Once | CUTANEOUS | Status: DC

## 2020-09-18 MED ORDER — PHENYLEPHRINE HCL-NACL 20-0.9 MG/250ML-% IV SOLN
INTRAVENOUS | Status: DC | PRN
  Administered 2020-09-18: 25 ug/min via INTRAVENOUS

## 2020-09-18 MED ORDER — BISACODYL 10 MG RE SUPP: 10.0000 mg | Freq: Every day | RECTAL | Status: DC | PRN

## 2020-09-18 MED ORDER — PANTOPRAZOLE SODIUM 40 MG PO TBEC
40.0000 mg | DELAYED_RELEASE_TABLET | Freq: Every day | ORAL | Status: DC
  Administered 2020-09-18 – 2020-10-10 (×22): 40 mg via ORAL
  Filled 2020-09-18 (×23): qty 1

## 2020-09-18 MED ORDER — DOCUSATE SODIUM 100 MG PO CAPS
100.0000 mg | ORAL_CAPSULE | Freq: Every day | ORAL | Status: DC
  Administered 2020-09-18 – 2020-09-24 (×7): 100 mg via ORAL
  Filled 2020-09-18 (×7): qty 1

## 2020-09-18 MED ORDER — SODIUM CHLORIDE 0.9% FLUSH: 3.0000 mL | INTRAVENOUS | Status: DC | PRN

## 2020-09-18 MED ORDER — ONDANSETRON HCL 4 MG/2ML IJ SOLN
4.0000 mg | Freq: Four times a day (QID) | INTRAMUSCULAR | Status: DC | PRN
  Administered 2020-09-18: 4 mg via INTRAVENOUS
  Filled 2020-09-18: qty 2

## 2020-09-18 MED ORDER — INSULIN ASPART 100 UNIT/ML IJ SOLN: 0.0000 [IU] | Freq: Three times a day (TID) | INTRAMUSCULAR | Status: DC

## 2020-09-18 MED ORDER — LIDOCAINE-EPINEPHRINE 1 %-1:100000 IJ SOLN
INTRAMUSCULAR | Status: AC
  Filled 2020-09-18: qty 1

## 2020-09-18 MED ORDER — ACETAMINOPHEN 160 MG/5ML PO SOLN: 1000.0000 mg | Freq: Once | ORAL | Status: DC | PRN

## 2020-09-18 MED ORDER — LIDOCAINE 2% (20 MG/ML) 5 ML SYRINGE
INTRAMUSCULAR | Status: DC | PRN
  Administered 2020-09-18: 100 mg via INTRAVENOUS

## 2020-09-18 MED ORDER — FENTANYL CITRATE (PF) 100 MCG/2ML IJ SOLN
25.0000 ug | INTRAMUSCULAR | Status: DC | PRN
  Administered 2020-09-18: 25 ug via INTRAVENOUS

## 2020-09-18 MED ORDER — KETOROLAC TROMETHAMINE 0.5 % OP SOLN
1.0000 [drp] | Freq: Every day | OPHTHALMIC | Status: DC
  Administered 2020-09-19 – 2020-09-20 (×2): 1 [drp] via OPHTHALMIC
  Filled 2020-09-18: qty 5

## 2020-09-18 MED ORDER — ACETAMINOPHEN 650 MG RE SUPP
650.0000 mg | RECTAL | Status: DC | PRN
  Administered 2020-09-25: 650 mg via RECTAL
  Filled 2020-09-18: qty 1

## 2020-09-18 MED ORDER — PROPOFOL 10 MG/ML IV BOLUS
INTRAVENOUS | Status: DC | PRN
  Administered 2020-09-18: 140 mg via INTRAVENOUS

## 2020-09-18 MED ORDER — PANTOPRAZOLE SODIUM 40 MG IV SOLR: 40.0000 mg | Freq: Every day | INTRAVENOUS | Status: DC

## 2020-09-18 MED ORDER — LIRAGLUTIDE 18 MG/3ML ~~LOC~~ SOPN
1.8000 mg | PEN_INJECTOR | Freq: Every day | SUBCUTANEOUS | Status: DC
  Administered 2020-09-24: 1.8 mg via SUBCUTANEOUS
  Filled 2020-09-18: qty 3

## 2020-09-18 MED ORDER — SENNA 8.6 MG PO TABS
1.0000 | ORAL_TABLET | Freq: Two times a day (BID) | ORAL | Status: DC
  Administered 2020-09-18 – 2020-10-11 (×41): 8.6 mg via ORAL
  Filled 2020-09-18 (×42): qty 1

## 2020-09-18 MED ORDER — ACETAMINOPHEN 325 MG PO TABS
650.0000 mg | ORAL_TABLET | ORAL | Status: DC | PRN
  Administered 2020-09-18 – 2020-10-11 (×24): 650 mg via ORAL
  Filled 2020-09-18 (×25): qty 2

## 2020-09-18 MED ORDER — LIDOCAINE-EPINEPHRINE 1 %-1:100000 IJ SOLN
INTRAMUSCULAR | Status: DC | PRN
  Administered 2020-09-18: 5 mL

## 2020-09-18 MED ORDER — PROPOFOL 10 MG/ML IV BOLUS
INTRAVENOUS | Status: AC
  Filled 2020-09-18: qty 20

## 2020-09-18 MED ORDER — SODIUM CHLORIDE 0.9% FLUSH
3.0000 mL | Freq: Two times a day (BID) | INTRAVENOUS | Status: DC
  Administered 2020-09-19 – 2020-09-25 (×10): 3 mL via INTRAVENOUS

## 2020-09-18 MED ORDER — EMPAGLIFLOZIN 10 MG PO TABS
10.0000 mg | ORAL_TABLET | Freq: Every day | ORAL | Status: DC
  Administered 2020-09-19 – 2020-09-25 (×7): 10 mg via ORAL
  Filled 2020-09-18 (×7): qty 1

## 2020-09-18 MED ORDER — ONDANSETRON HCL 4 MG PO TABS
4.0000 mg | ORAL_TABLET | Freq: Four times a day (QID) | ORAL | Status: DC | PRN
Start: 2020-09-18 — End: 2020-10-12

## 2020-09-18 MED ORDER — SODIUM CHLORIDE 0.9 % IV SOLN: 250.0000 mL | INTRAVENOUS | Status: DC

## 2020-09-18 MED ORDER — MAGNESIUM OXIDE -MG SUPPLEMENT 400 (240 MG) MG PO TABS
400.0000 mg | ORAL_TABLET | Freq: Two times a day (BID) | ORAL | Status: DC
  Administered 2020-09-18 – 2020-10-11 (×45): 400 mg via ORAL
  Filled 2020-09-18 (×46): qty 1

## 2020-09-18 MED ORDER — DEXAMETHASONE SODIUM PHOSPHATE 10 MG/ML IJ SOLN
INTRAMUSCULAR | Status: DC | PRN
  Administered 2020-09-18: 10 mg via INTRAVENOUS

## 2020-09-18 MED ORDER — ISOSORBIDE MONONITRATE ER 30 MG PO TB24
30.0000 mg | ORAL_TABLET | Freq: Every day | ORAL | Status: DC
  Administered 2020-09-19 – 2020-10-04 (×15): 30 mg via ORAL
  Filled 2020-09-18 (×16): qty 1

## 2020-09-18 MED ORDER — OXYCODONE HCL 5 MG PO TABS: 5.0000 mg | ORAL_TABLET | Freq: Once | ORAL | Status: DC | PRN

## 2020-09-18 MED ORDER — VITAMIN B-12 1000 MCG PO TABS
1000.0000 ug | ORAL_TABLET | Freq: Every day | ORAL | Status: DC
  Administered 2020-09-19 – 2020-10-11 (×22): 1000 ug via ORAL
  Filled 2020-09-18 (×23): qty 1

## 2020-09-18 MED ORDER — LACTATED RINGERS IV SOLN: INTRAVENOUS | Status: DC

## 2020-09-18 MED ORDER — FENTANYL CITRATE (PF) 250 MCG/5ML IJ SOLN
INTRAMUSCULAR | Status: AC
  Filled 2020-09-18: qty 5

## 2020-09-18 MED ORDER — SODIUM CHLORIDE 0.9 % IV SOLN: INTRAVENOUS | Status: DC

## 2020-09-18 MED ORDER — OXYCODONE HCL 5 MG PO TABS
10.0000 mg | ORAL_TABLET | ORAL | Status: DC | PRN
  Administered 2020-09-22 – 2020-09-24 (×6): 10 mg via ORAL
  Filled 2020-09-18 (×6): qty 2

## 2020-09-18 MED ORDER — MENTHOL 3 MG MT LOZG: 1.0000 | LOZENGE | OROMUCOSAL | Status: DC | PRN

## 2020-09-18 MED ORDER — ACETAMINOPHEN 500 MG PO TABS: 1000.0000 mg | ORAL_TABLET | Freq: Once | ORAL | Status: DC | PRN

## 2020-09-18 MED ORDER — ONDANSETRON HCL 4 MG/2ML IJ SOLN
INTRAMUSCULAR | Status: AC
  Filled 2020-09-18: qty 4

## 2020-09-18 MED ORDER — NITROGLYCERIN 0.4 MG SL SUBL: 0.4000 mg | SUBLINGUAL_TABLET | SUBLINGUAL | Status: DC | PRN

## 2020-09-18 MED ORDER — BUPIVACAINE HCL (PF) 0.5 % IJ SOLN
INTRAMUSCULAR | Status: AC
  Filled 2020-09-18: qty 30

## 2020-09-18 MED ORDER — FENTANYL CITRATE (PF) 100 MCG/2ML IJ SOLN: 25.0000 ug | INTRAMUSCULAR | Status: DC | PRN

## 2020-09-18 MED ORDER — OMEGA-3-ACID ETHYL ESTERS 1 G PO CAPS
1.0000 g | ORAL_CAPSULE | Freq: Every day | ORAL | Status: DC
  Administered 2020-09-19 – 2020-10-11 (×21): 1 g via ORAL
  Filled 2020-09-18 (×22): qty 1

## 2020-09-18 MED ORDER — METHOCARBAMOL 1000 MG/10ML IJ SOLN
500.0000 mg | Freq: Four times a day (QID) | INTRAVENOUS | Status: DC | PRN
  Filled 2020-09-18: qty 5

## 2020-09-18 MED ORDER — ONDANSETRON HCL 4 MG/2ML IJ SOLN
INTRAMUSCULAR | Status: DC | PRN
  Administered 2020-09-18: 4 mg via INTRAVENOUS

## 2020-09-18 MED ORDER — OXYCODONE HCL 5 MG PO TABS
5.0000 mg | ORAL_TABLET | ORAL | Status: DC | PRN
  Administered 2020-09-18 – 2020-09-22 (×7): 5 mg via ORAL
  Filled 2020-09-18 (×8): qty 1

## 2020-09-18 MED ORDER — BUPROPION HCL ER (XL) 150 MG PO TB24
150.0000 mg | ORAL_TABLET | Freq: Every morning | ORAL | Status: DC
  Administered 2020-09-19 – 2020-10-11 (×21): 150 mg via ORAL
  Filled 2020-09-18 (×17): qty 1

## 2020-09-18 MED ORDER — MORPHINE SULFATE (PF) 4 MG/ML IV SOLN
4.0000 mg | INTRAVENOUS | Status: DC | PRN
  Administered 2020-09-21: 4 mg via INTRAVENOUS
  Filled 2020-09-18: qty 1

## 2020-09-18 MED ORDER — ALBUMIN HUMAN 5 % IV SOLN: INTRAVENOUS | Status: DC | PRN

## 2020-09-18 MED ORDER — PHENOL 1.4 % MT LIQD: 1.0000 | OROMUCOSAL | Status: DC | PRN

## 2020-09-18 MED ORDER — ROCURONIUM BROMIDE 10 MG/ML (PF) SYRINGE
PREFILLED_SYRINGE | INTRAVENOUS | Status: DC | PRN
  Administered 2020-09-18: 100 mg via INTRAVENOUS

## 2020-09-18 MED ORDER — METOPROLOL SUCCINATE ER 50 MG PO TB24
50.0000 mg | ORAL_TABLET | Freq: Every day | ORAL | Status: DC
  Administered 2020-09-19 – 2020-09-28 (×10): 50 mg via ORAL
  Filled 2020-09-18 (×10): qty 1

## 2020-09-18 MED ORDER — IPRATROPIUM-ALBUTEROL 0.5-2.5 (3) MG/3ML IN SOLN
3.0000 mL | Freq: Four times a day (QID) | RESPIRATORY_TRACT | Status: DC
  Administered 2020-09-18: 3 mL via RESPIRATORY_TRACT
  Filled 2020-09-18: qty 3

## 2020-09-18 MED ORDER — SENNOSIDES-DOCUSATE SODIUM 8.6-50 MG PO TABS: 1.0000 | ORAL_TABLET | Freq: Every evening | ORAL | Status: DC | PRN

## 2020-09-18 MED ORDER — DEXAMETHASONE SODIUM PHOSPHATE 10 MG/ML IJ SOLN
INTRAMUSCULAR | Status: AC
  Filled 2020-09-18: qty 2

## 2020-09-18 MED ORDER — FENTANYL CITRATE (PF) 250 MCG/5ML IJ SOLN
INTRAMUSCULAR | Status: DC | PRN
  Administered 2020-09-18: 100 ug via INTRAVENOUS
  Administered 2020-09-18 (×3): 25 ug via INTRAVENOUS
  Administered 2020-09-18: 50 ug via INTRAVENOUS
  Administered 2020-09-18: 25 ug via INTRAVENOUS

## 2020-09-18 MED ORDER — METHOCARBAMOL 500 MG PO TABS
500.0000 mg | ORAL_TABLET | Freq: Four times a day (QID) | ORAL | Status: DC | PRN
  Administered 2020-09-18 – 2020-09-25 (×6): 500 mg via ORAL
  Filled 2020-09-18 (×6): qty 1

## 2020-09-18 MED ORDER — ROCURONIUM BROMIDE 10 MG/ML (PF) SYRINGE
PREFILLED_SYRINGE | INTRAVENOUS | Status: AC
  Filled 2020-09-18: qty 30

## 2020-09-18 MED ORDER — METOPROLOL SUCCINATE ER 25 MG PO TB24
50.0000 mg | ORAL_TABLET | Freq: Once | ORAL | Status: AC
  Administered 2020-09-18: 50 mg via ORAL
  Filled 2020-09-18: qty 2

## 2020-09-18 MED ORDER — ROSUVASTATIN CALCIUM 20 MG PO TABS
20.0000 mg | ORAL_TABLET | Freq: Every day | ORAL | Status: DC
  Administered 2020-09-18 – 2020-10-10 (×23): 20 mg via ORAL
  Filled 2020-09-18 (×23): qty 1

## 2020-09-18 MED ORDER — OXYCODONE HCL 5 MG/5ML PO SOLN: 5.0000 mg | Freq: Once | ORAL | Status: DC | PRN

## 2020-09-18 MED ORDER — ACETAMINOPHEN 10 MG/ML IV SOLN: 1000.0000 mg | Freq: Once | INTRAVENOUS | Status: DC | PRN

## 2020-09-18 MED ORDER — CEFAZOLIN SODIUM-DEXTROSE 2-4 GM/100ML-% IV SOLN
2.0000 g | INTRAVENOUS | Status: AC
  Administered 2020-09-18 (×2): 2 g via INTRAVENOUS
  Filled 2020-09-18: qty 100

## 2020-09-18 MED ORDER — 0.9 % SODIUM CHLORIDE (POUR BTL) OPTIME
TOPICAL | Status: DC | PRN
  Administered 2020-09-18: 1000 mL

## 2020-09-18 MED ORDER — DULOXETINE HCL 60 MG PO CPEP
60.0000 mg | ORAL_CAPSULE | Freq: Every day | ORAL | Status: DC
  Administered 2020-09-19 – 2020-10-11 (×21): 60 mg via ORAL
  Filled 2020-09-18 (×2): qty 1
  Filled 2020-09-18: qty 2
  Filled 2020-09-18 (×4): qty 1
  Filled 2020-09-18: qty 2
  Filled 2020-09-18 (×15): qty 1

## 2020-09-18 MED ORDER — THROMBIN 5000 UNITS EX SOLR
OROMUCOSAL | Status: DC | PRN
  Administered 2020-09-18 (×2): 5 mL via TOPICAL

## 2020-09-18 MED ORDER — ACETAMINOPHEN 10 MG/ML IV SOLN
INTRAVENOUS | Status: AC
  Filled 2020-09-18: qty 100

## 2020-09-18 MED ORDER — PHENYLEPHRINE HCL (PRESSORS) 10 MG/ML IV SOLN
INTRAVENOUS | Status: DC | PRN
  Administered 2020-09-18: 80 ug via INTRAVENOUS
  Administered 2020-09-18: 40 ug via INTRAVENOUS

## 2020-09-18 MED ORDER — TRIAMCINOLONE ACETONIDE 0.1 % EX CREA: 1.0000 "application " | TOPICAL_CREAM | Freq: Two times a day (BID) | CUTANEOUS | Status: DC | PRN

## 2020-09-18 MED ORDER — BUPIVACAINE HCL (PF) 0.5 % IJ SOLN
INTRAMUSCULAR | Status: DC | PRN
  Administered 2020-09-18: 5 mL

## 2020-09-18 SURGICAL SUPPLY — 72 items
ADH SKN CLS APL DERMABOND .7 (GAUZE/BANDAGES/DRESSINGS) ×1
APL SKNCLS STERI-STRIP NONHPOA (GAUZE/BANDAGES/DRESSINGS)
BAG COUNTER SPONGE SURGICOUNT (BAG) ×4 IMPLANT
BAG SPNG CNTER NS LX DISP (BAG) ×2
BASKET BONE COLLECTION (BASKET) ×2 IMPLANT
BENZOIN TINCTURE PRP APPL 2/3 (GAUZE/BANDAGES/DRESSINGS) IMPLANT
BLADE BN FN 3.2XSTRL LF (MISCELLANEOUS) ×1 IMPLANT
BLADE BONE MILL FINE (MISCELLANEOUS) ×2
BLADE CLIPPER SURG (BLADE) ×2 IMPLANT
BLADE SURG 11 STRL SS (BLADE) ×2 IMPLANT
BUR MATCHSTICK NEURO 3.0 LAGG (BURR) ×2 IMPLANT
BUR PRECISION FLUTE 5.0 (BURR) ×2 IMPLANT
CAGE INTERBODY PL LG 7X26.5X24 (Cage) ×8 IMPLANT
CANISTER SUCT 3000ML PPV (MISCELLANEOUS) ×2 IMPLANT
CARTRIDGE OIL MAESTRO DRILL (MISCELLANEOUS) ×1 IMPLANT
CNTNR URN SCR LID CUP LEK RST (MISCELLANEOUS) ×1 IMPLANT
CONT SPEC 4OZ STRL OR WHT (MISCELLANEOUS) ×2
COVER BACK TABLE 60X90IN (DRAPES) ×2 IMPLANT
DERMABOND ADVANCED (GAUZE/BANDAGES/DRESSINGS) ×1
DERMABOND ADVANCED .7 DNX12 (GAUZE/BANDAGES/DRESSINGS) ×1 IMPLANT
DIFFUSER DRILL AIR PNEUMATIC (MISCELLANEOUS) ×2 IMPLANT
DRAPE C-ARM 42X72 X-RAY (DRAPES) ×2 IMPLANT
DRAPE C-ARMOR (DRAPES) ×2 IMPLANT
DRAPE LAPAROTOMY 100X72X124 (DRAPES) ×2 IMPLANT
DRAPE SURG 17X23 STRL (DRAPES) ×2 IMPLANT
DRSG OPSITE POSTOP 4X8 (GAUZE/BANDAGES/DRESSINGS) ×2 IMPLANT
DURAPREP 26ML APPLICATOR (WOUND CARE) ×2 IMPLANT
ELECT REM PT RETURN 9FT ADLT (ELECTROSURGICAL) ×2
ELECTRODE REM PT RTRN 9FT ADLT (ELECTROSURGICAL) ×1 IMPLANT
GAUZE 4X4 16PLY ~~LOC~~+RFID DBL (SPONGE) ×4 IMPLANT
GAUZE SPONGE 4X4 12PLY STRL (GAUZE/BANDAGES/DRESSINGS) IMPLANT
GLOVE ECLIPSE 9.0 STRL (GLOVE) ×2 IMPLANT
GLOVE EXAM NITRILE XL STR (GLOVE) IMPLANT
GLOVE SURG ENC MOIS LTX SZ7.5 (GLOVE) ×4 IMPLANT
GLOVE SURG LTX SZ7 (GLOVE) ×4 IMPLANT
GLOVE SURG UNDER POLY LF SZ7.5 (GLOVE) ×8 IMPLANT
GOWN STRL REUS W/ TWL LRG LVL3 (GOWN DISPOSABLE) ×2 IMPLANT
GOWN STRL REUS W/ TWL XL LVL3 (GOWN DISPOSABLE) ×4 IMPLANT
GOWN STRL REUS W/TWL 2XL LVL3 (GOWN DISPOSABLE) IMPLANT
GOWN STRL REUS W/TWL LRG LVL3 (GOWN DISPOSABLE) ×4
GOWN STRL REUS W/TWL XL LVL3 (GOWN DISPOSABLE) ×8
GRAFT BONE PROTEIOS SM 1CC (Orthopedic Implant) ×2 IMPLANT
HEMOSTAT POWDER KIT SURGIFOAM (HEMOSTASIS) ×4 IMPLANT
KIT BASIN OR (CUSTOM PROCEDURE TRAY) ×2 IMPLANT
KIT POSITION SURG JACKSON T1 (MISCELLANEOUS) ×2 IMPLANT
KIT TURNOVER KIT B (KITS) ×2 IMPLANT
MILL MEDIUM DISP (BLADE) IMPLANT
NEEDLE HYPO 18GX1.5 BLUNT FILL (NEEDLE) IMPLANT
NEEDLE HYPO 22GX1.5 SAFETY (NEEDLE) ×2 IMPLANT
NEEDLE SPNL 18GX3.5 QUINCKE PK (NEEDLE) IMPLANT
NS IRRIG 1000ML POUR BTL (IV SOLUTION) ×2 IMPLANT
OIL CARTRIDGE MAESTRO DRILL (MISCELLANEOUS) ×2
PACK LAMINECTOMY NEURO (CUSTOM PROCEDURE TRAY) ×2 IMPLANT
PUTTY GRAFTON DBF 6CC W/DELIVE (Putty) ×2 IMPLANT
ROD CVD SOLERA 5.5X90 (Rod) ×4 IMPLANT
SCREW SET SOLERA (Screw) ×8 IMPLANT
SCREW SET SOLERA TI5.5 (Screw) ×4 IMPLANT
SCREW SOLERA 45X5.5XMA NS SPNE (Screw) ×1 IMPLANT
SCREW SOLERA 45X6.5XMA NS SPNE (Screw) ×3 IMPLANT
SCREW SOLERA 5.5X45MM (Screw) ×2 IMPLANT
SCREW SOLERA 6.5X45MM (Screw) ×6 IMPLANT
SPONGE SURGIFOAM ABS GEL 100 (HEMOSTASIS) IMPLANT
SPONGE T-LAP 4X18 ~~LOC~~+RFID (SPONGE) ×4 IMPLANT
STRIP CLOSURE SKIN 1/2X4 (GAUZE/BANDAGES/DRESSINGS) IMPLANT
SUT VIC AB 0 CT1 18XCR BRD8 (SUTURE) ×1 IMPLANT
SUT VIC AB 0 CT1 8-18 (SUTURE) ×2
SUT VIC AB 3-0 FS2 27 (SUTURE) IMPLANT
SUT VICRYL 3-0 RB1 18 ABS (SUTURE) ×2 IMPLANT
TOWEL GREEN STERILE (TOWEL DISPOSABLE) ×2 IMPLANT
TOWEL GREEN STERILE FF (TOWEL DISPOSABLE) ×2 IMPLANT
TRAY FOLEY MTR SLVR 16FR STAT (SET/KITS/TRAYS/PACK) ×2 IMPLANT
WATER STERILE IRR 1000ML POUR (IV SOLUTION) ×2 IMPLANT

## 2020-09-18 NOTE — H&P (Signed)
Chief Complaint  Back and leg pain  History of Present Illness  Ricky Nelson is a 85 y.o. male initially seen in the outpatient neurosurgery clinic with a history of previous lumbar fusion at L4-5 by Dr. Sherwood Gambler.  Over the last several months he has been complaining of significant low back and bilateral leg pain worse with walking consistent with neurogenic claudication.  His MRI did demonstrate severe multifactorial stenosis at L2-3 and L3-4 adjacent to the previous fusion at L4-5.  He attempted epidural steroid injections unfortunately without significant improvement.  He therefore elected to proceed with surgical decompression and fusion.  Past Medical History   Past Medical History:  Diagnosis Date   Anemia    Anginal pain ([place])    2018; chronic stable angina 06/2020 (Dr. Atilano Median)   Cancer [facility])    bladder   CKD (chronic kidney disease)    Coronary artery disease    Diabetes mellitus without complication ([employer])    type 2   Hypertension    Myocardial infarction ([place]) 1995   Pneumonia 1995   Sleep apnea 2012    Past Surgical History   Past Surgical History:  Procedure Laterality Date   BACK SURGERY  2012   CAROTID ENDARTERECTOMY Right    right CEA ~ 2004, Dr. Heinz Knuckles, [place]  2012   [place] Bilateral 2018   cataracts, since removed    Social History   Social History   Tobacco Use   Smoking status: Never   Smokeless tobacco: Never  Vaping Use   Vaping Use: Never used  Substance Use Topics   Alcohol use: Not Currently   Drug use: Never    Medications   Prior to Admission medications   Medication Sig Start Date End Date Taking? Authorizing Provider  buPROPion (WELLBUTRIN XL) 150 MG 24 hr tablet Take 150 mg by mouth every morning. 09/06/20  Yes [provider]  clopidogrel (PLAVIX) 75 MG tablet Take 75 mg by mouth daily. 08/23/20  Yes [provider]  Docusate Sodium  (DSS) 100 MG CAPS Take 100 mg by mouth daily. 05/24/19  Yes [provider]  DULoxetine (CYMBALTA) 60 MG capsule Take 60 mg by mouth daily. 09/06/20  Yes [provider]  fluticasone (FLONASE) 50 MCG/ACT nasal spray Place 1 spray into both nostrils daily as needed for allergies.   Yes [provider]  gabapentin (NEURONTIN) 300 MG capsule Take 300 mg by mouth 3 (three) times daily.   Yes [provider]  glipiZIDE (GLUCOTROL) 10 MG tablet Take 20 mg by mouth 2 (two) times daily before a meal.   Yes [provider]  insulin detemir (LEVEMIR) 100 UNIT/ML injection Inject 35 Units into the skin daily.   Yes [provider]  Ipratropium-Albuterol (COMBIVENT RESPIMAT) 20-100 MCG/ACT AERS respimat Inhale 2 puffs into the lungs every 6 (six) hours.   Yes [provider]  isosorbide mononitrate (IMDUR) 30 MG 24 hr tablet Take 30 mg by mouth daily.   Yes [provider]  JARDIANCE 10 MG TABS tablet Take 10 mg by mouth daily. 08/31/20  Yes [provider]  ketorolac (ACULAR) 0.5 % ophthalmic solution Place 1 drop into the left eye daily. 08/13/20  Yes [provider]  magnesium oxide (MAG-OX) 400 MG tablet Take 400 mg by mouth 2 (two) times daily.   Yes [provider]  metoprolol succinate (TOPROL-XL) 50 MG 24 hr  tablet Take 50 mg by mouth daily. Take with or immediately following a meal.   Yes [provider]  Multiple Vitamin (THERA-TABS PO) Take 1 tablet by mouth daily.   Yes [provider]  nitroGLYCERIN (NITROSTAT) 0.4 MG SL tablet Place 0.4 mg under the tongue every 5 (five) minutes as needed for chest pain.   Yes [provider]  omega-3 acid ethyl esters (LOVAZA) 1 g capsule Take 1 g by mouth daily.   Yes [provider]  rosuvastatin (CRESTOR) 20 MG tablet Take 20 mg by mouth daily.   Yes [provider]  vitamin B-12 (CYANOCOBALAMIN) 1000 MCG tablet Take 1,000  mcg by mouth daily.   Yes [provider]  liraglutide (VICTOZA) 18 MG/3ML SOPN Inject 1.8 mg into the skin at bedtime.    [provider]  triamcinolone cream (KENALOG) 0.1 % Apply 1 application topically 2 (two) times daily as needed (itching).    [provider]    Allergies   Allergies  Allergen Reactions   Allergy Relief [Chlorpheniramine Maleate] Shortness Of Breath   Diazepam     Other reaction(s): Hypotension, Low blood pressure, Other (See Comments)   Diphenhydramine Hcl Shortness Of Breath    Other reaction(s): Other (See Comments), Stopped breathing Other reaction(s): Shortness Of Breath Shortness of breath with benadryl    Diphenhydramine-Zinc Acetate Shortness Of Breath   Atorvastatin Other (See Comments)    Other reaction(s): Muscle pain, Other (See Comments), Unknown Elevated CK      Fluvastatin Other (See Comments)    Other reaction(s): Other (See Comments) myalgias   Lisinopril     Other reaction(s): Hyperkalemia, Other (See Comments)   Metformin And Related     Other reaction(s): Other Renal function   Piperacillin-Tazobactam In Dex Rash    Other reaction(s): Rash Other reaction(s): Rash    Diphenhydramine     Other reaction(s): Low blood pressure   Other Other (See Comments)    Lescol    Review of Systems  ROS  Neurologic Exam  Awake, alert, oriented Memory and concentration grossly intact Speech fluent, appropriate CN grossly intact Motor exam: Upper Extremities Deltoid Bicep Tricep Grip  Right 5/5 5/5 5/5 5/5  Left 5/5 5/5 5/5 5/5   Lower Extremities IP Quad PF DF EHL  Right 5/5 5/5 5/5 5/5 5/5  Left 5/5 5/5 5/5 5/5 5/5   Sensation grossly intact to LT  Imaging  MRI of the lumbar spine dated 11/01/2019 were personally reviewed.  This demonstrates previous instrumentation at L4-5.  There is broad-based disc bulge with significant ligamentous hypertrophy and bilateral facet arthropathy at both L2 and L3 which  contribute to moderate to severe stenosis at these levels.  Impression  - 85 y.o. male with back and leg pain consistent with neurogenic claudication related to multifactorial stenosis at L2-3 and L3-4 adjacent to previous fusion at L4-5.  Patient attempted multiple different conservative treatments with progression of his symptoms.  Plan  -We will plan on proceeding with decompression and fusion at L2-3 and L3-4 with connection to previous hardware at L4-5.  I have reviewed the indications for surgery as well as the expected postoperative course and recovery with the patient and his family.  We have discussed the associated risks, benefits, and alternatives to surgery.  All their questions were answered and he provided informed consent to proceed.  Consuella Lose, MD [facility]

## 2020-09-18 NOTE — Progress Notes (Signed)
Patient refused the use of CPAP for the evening.  

## 2020-09-18 NOTE — Progress Notes (Signed)
Orthopedic Tech Progress Note Patient Details:  Ricky Nelson [birth year] [id number] Patient's LSO brace was sized using his waist size of 42 inches  Ortho Devices Type of Ortho Device: Lumbar corsett Ortho Device/Splint Location: Back Ortho Device/Splint Interventions: Ordered, Adjustment   Post Interventions Instructions Provided: Adjustment of device  Carl Butner E Paige Monarrez 09/18/2020, 7:28 PM

## 2020-09-18 NOTE — Anesthesia Postprocedure Evaluation (Signed)
Anesthesia Post Note  Patient: BANJAMIN WHITTEN  Procedure(s) Performed: Posterior Lumbar Interbody Fusion Lumbar Two-Three, Lumbar Three-Four with Extension of Fusion to Lumbar Four-Five (Spine Lumbar)     Patient location during evaluation: PACU Anesthesia Type: General Level of consciousness: awake and alert Pain management: pain level controlled Vital Signs Assessment: post-procedure vital signs reviewed and stable Respiratory status: spontaneous breathing, nonlabored ventilation, respiratory function stable and patient connected to nasal cannula oxygen Cardiovascular status: blood pressure returned to baseline and stable Postop Assessment: no apparent nausea or vomiting Anesthetic complications: no   No notable events documented.  Last Vitals:  Vitals:   09/18/20 0928 09/18/20 1737  BP: (!) 177/90 (!) 128/59  Pulse: 91 63  Resp: 18 20  Temp: 36.7 C 36.5 C  SpO2: 96% 95%    Last Pain:  Vitals:   09/18/20 1737  TempSrc:   PainSc: [place]

## 2020-09-18 NOTE — Anesthesia Procedure Notes (Signed)
Procedure Name: Intubation Date/Time: 09/18/2020 11:17 AM Performed by: Clearnce Sorrel, CRNA Pre-anesthesia Checklist: Patient identified, Emergency Drugs available, Suction available and Patient being monitored Patient Re-evaluated:Patient Re-evaluated prior to induction Oxygen Delivery Method: Circle System Utilized Preoxygenation: Pre-oxygenation with 100% oxygen Induction Type: IV induction Ventilation: Mask ventilation without difficulty Laryngoscope Size: Mac and 4 Grade View: Grade I Tube type: Oral Tube size: 7.5 mm Number of attempts: 1 Airway Equipment and Method: Stylet and Oral airway Placement Confirmation: ETT inserted through vocal cords under direct vision, positive ETCO2 and breath sounds checked- equal and bilateral Secured at: 22 cm Tube secured with: Tape Dental Injury: Teeth and Oropharynx as per pre-operative assessment  Comments: Placed by Orland Mustard SRNA

## 2020-09-18 NOTE — Brief Op Note (Signed)
09/18/2020  5:30 PM  PATIENT:  Ricky Nelson  85 y.o. male  PRE-OPERATIVE DIAGNOSIS:  OSTEOARTHRITIS OF SPINE WITH RADICULOPATHY, LUMBAR REGION  POST-OPERATIVE DIAGNOSIS:  OSTEOARTHRITIS OF SPINE WITH RADICULOPATHY, LUMBAR REGION  PROCEDURE:  Procedure(s): Posterior Lumbar Interbody Fusion Lumbar Two-Three, Lumbar Three-Four with Extension of Fusion to Lumbar Four-Five (N/A)  SURGEON:  Surgeon(s) and Role:    * Consuella Lose, MD - Primary    * Earnie Larsson, MD - Assisting  PHYSICIAN ASSISTANT:   ASSISTANTS: Dr. Earnie Larsson, MD   ANESTHESIA:   general  EBL:  800 mL   BLOOD ADMINISTERED:none  DRAINS: none   LOCAL MEDICATIONS USED:  BUPIVICAINE  and XYLOCAINE   SPECIMEN:  No Specimen  DISPOSITION OF SPECIMEN:  N/A  COUNTS:  YES  TOURNIQUET:  * No tourniquets in log *  DICTATION: .Note written in EPIC  PLAN OF CARE: Admit to inpatient   PATIENT DISPOSITION:  PACU - hemodynamically stable.   Delay start of Pharmacological VTE agent (>24hrs) due to surgical blood loss or risk of bleeding: yes

## 2020-09-18 NOTE — Transfer of Care (Signed)
Immediate Anesthesia Transfer of Care Note  Patient: [name]  Procedure(s) Performed: Posterior Lumbar Interbody Fusion Lumbar Two-Three, Lumbar Three-Four with Extension of Fusion to Lumbar Four-Five (Spine Lumbar)  Patient Location: PACU  Anesthesia Type:General  Level of Consciousness: drowsy  Airway & Oxygen Therapy: Patient Spontanous Breathing and Patient connected to nasal cannula oxygen  Post-op Assessment: Report given to RN and Post -op Vital signs reviewed and stable  Post vital signs: Reviewed and stable  Last Vitals:  Vitals Value Taken Time  BP 128/59 09/18/20 1737  Temp 36.5 C 09/18/20 1737  Pulse 78 09/18/20 1743  Resp 18 09/18/20 1743  SpO2 93 % 09/18/20 1743  Vitals shown include unvalidated device data.  Last Pain:  Vitals:   09/18/20 0943  TempSrc:   PainSc: 8       Patients Stated Pain Goal: 2 ([id number])  Complications: No notable events documented.

## 2020-09-18 NOTE — Progress Notes (Signed)
Dr. Kathyrn Sheriff notified of and okay with Ancef allergy.

## 2020-09-18 NOTE — Addendum Note (Signed)
Addendum  created 09/18/20 1820 by Oleta Mouse, MD   Order list changed, Order sets accessed, Pharmacy for encounter modified

## 2020-09-19 LAB — GLUCOSE, CAPILLARY
Glucose-Capillary: 182 mg/dL — ABNORMAL HIGH (ref 70–99)
Glucose-Capillary: 237 mg/dL — ABNORMAL HIGH (ref 70–99)
Glucose-Capillary: 246 mg/dL — ABNORMAL HIGH (ref 70–99)
Glucose-Capillary: 177 mg/dL — ABNORMAL HIGH (ref 70–99)

## 2020-09-19 LAB — BASIC METABOLIC PANEL
Anion gap: 10 (ref 5–15)
BUN: 29 mg/dL — ABNORMAL HIGH (ref 8–23)
CO2: 20 mmol/L — ABNORMAL LOW (ref 22–32)
Calcium: 7.9 mg/dL — ABNORMAL LOW (ref 8.9–10.3)
Chloride: 106 mmol/L (ref 98–111)
Creatinine, Ser: 1.8 mg/dL — ABNORMAL HIGH (ref 0.61–1.24)
GFR, Estimated: 37 mL/min — ABNORMAL LOW (ref >60)
Glucose, Bld: 207 mg/dL — ABNORMAL HIGH (ref 70–99)
Potassium: 5.1 mmol/L (ref 3.5–5.1)
Sodium: 136 mmol/L (ref 135–145)

## 2020-09-19 LAB — CBC
HCT: 26.7 % — ABNORMAL LOW (ref 39.0–52.0)
Hemoglobin: 8.2 g/dL — ABNORMAL LOW (ref 13.0–17.0)
MCH: 30.9 pg (ref 26.0–34.0)
MCHC: 30.7 g/dL (ref 30.0–36.0)
MCV: 100.8 fL — ABNORMAL HIGH (ref 80.0–100.0)
Platelets: 155 10*3/uL (ref 150–400)
RBC: 2.65 MIL/uL — ABNORMAL LOW (ref 4.22–5.81)
RDW: 13.5 % (ref 11.5–15.5)
WBC: 13 10*3/uL — ABNORMAL HIGH (ref 4.0–10.5)
nRBC: 0 % (ref 0.0–0.2)

## 2020-09-19 MED ORDER — GLUCERNA SHAKE PO LIQD
237.0000 mL | Freq: Three times a day (TID) | ORAL | Status: DC
  Administered 2020-09-19 – 2020-10-11 (×53): 237 mL via ORAL
  Filled 2020-09-19 (×7): qty 237

## 2020-09-19 MED ORDER — TAMSULOSIN HCL 0.4 MG PO CAPS
0.4000 mg | ORAL_CAPSULE | Freq: Every day | ORAL | Status: DC
  Administered 2020-09-20 – 2020-10-11 (×21): 0.4 mg via ORAL
  Filled 2020-09-19 (×22): qty 1

## 2020-09-19 MED ORDER — INSULIN ASPART 100 UNIT/ML IJ SOLN
0.0000 [IU] | Freq: Three times a day (TID) | INTRAMUSCULAR | Status: DC
  Administered 2020-09-19 – 2020-09-20 (×3): 7 [IU] via SUBCUTANEOUS
  Administered 2020-09-20: 11 [IU] via SUBCUTANEOUS
  Administered 2020-09-21: 5 [IU] via SUBCUTANEOUS
  Administered 2020-09-21: 3 [IU] via SUBCUTANEOUS
  Administered 2020-09-21 – 2020-09-23 (×4): 4 [IU] via SUBCUTANEOUS
  Administered 2020-09-23: 3 [IU] via SUBCUTANEOUS
  Administered 2020-09-24: 7 [IU] via SUBCUTANEOUS
  Administered 2020-09-24 – 2020-09-26 (×3): 4 [IU] via SUBCUTANEOUS
  Administered 2020-09-26: 3 [IU] via SUBCUTANEOUS
  Administered 2020-09-27: 4 [IU] via SUBCUTANEOUS
  Administered 2020-09-27: 7 [IU] via SUBCUTANEOUS
  Administered 2020-09-27: 3 [IU] via SUBCUTANEOUS
  Administered 2020-09-28: 4 [IU] via SUBCUTANEOUS
  Administered 2020-09-28: 3 [IU] via SUBCUTANEOUS
  Administered 2020-09-29: 7 [IU] via SUBCUTANEOUS
  Administered 2020-09-29: 4 [IU] via SUBCUTANEOUS
  Administered 2020-09-29: 3 [IU] via SUBCUTANEOUS
  Administered 2020-09-30: 4 [IU] via SUBCUTANEOUS
  Administered 2020-09-30: 11 [IU] via SUBCUTANEOUS
  Administered 2020-09-30 – 2020-10-01 (×3): 4 [IU] via SUBCUTANEOUS
  Administered 2020-10-01: 11 [IU] via SUBCUTANEOUS
  Administered 2020-10-02: 7 [IU] via SUBCUTANEOUS
  Administered 2020-10-02 – 2020-10-03 (×3): 4 [IU] via SUBCUTANEOUS
  Administered 2020-10-03: 3 [IU] via SUBCUTANEOUS
  Administered 2020-10-03 – 2020-10-04 (×3): 4 [IU] via SUBCUTANEOUS
  Administered 2020-10-04: 11 [IU] via SUBCUTANEOUS
  Administered 2020-10-05: 3 [IU] via SUBCUTANEOUS
  Administered 2020-10-05: 4 [IU] via SUBCUTANEOUS
  Administered 2020-10-05: 7 [IU] via SUBCUTANEOUS
  Administered 2020-10-06: 3 [IU] via SUBCUTANEOUS
  Administered 2020-10-07: 4 [IU] via SUBCUTANEOUS
  Administered 2020-10-07 – 2020-10-09 (×4): 3 [IU] via SUBCUTANEOUS
  Administered 2020-10-09 – 2020-10-10 (×2): 4 [IU] via SUBCUTANEOUS
  Administered 2020-10-10: 3 [IU] via SUBCUTANEOUS
  Administered 2020-10-11: 4 [IU] via SUBCUTANEOUS

## 2020-09-19 MED ORDER — TAMSULOSIN HCL 0.4 MG PO CAPS
0.8000 mg | ORAL_CAPSULE | Freq: Once | ORAL | Status: AC
  Administered 2020-09-19: 0.8 mg via ORAL
  Filled 2020-09-19: qty 2

## 2020-09-19 MED ORDER — CHLORHEXIDINE GLUCONATE CLOTH 2 % EX PADS
6.0000 | MEDICATED_PAD | Freq: Every day | CUTANEOUS | Status: DC
  Administered 2020-09-19 – 2020-10-02 (×12): 6 via TOPICAL

## 2020-09-19 MED FILL — Thrombin For Soln 5000 Unit: CUTANEOUS | Qty: 5000 | Status: AC

## 2020-09-19 NOTE — Evaluation (Signed)
Physical Therapy Evaluation Patient Details Name: Ricky Nelson MRN: [id number] DOB: [birth year] Today's Date: 09/19/2020   History of Present Illness  85 y.o. male admitted to [facility] on 8/30 for L2-4 fusion with extension of hardware L2-L5. PMH significant for HTN, DM2, Bladder cancer, MI in 1995, Sleep apnea.  Clinical Impression  Pt admitted with above diagnosis. At the time of PT eval, pt was able to demonstrate transfers and ambulation with gross min assist to mod assist and RW for support. Pt was educated on precautions, brace application/wearing schedule, appropriate activity progression, and car transfer. Pt currently with functional limitations due to the deficits listed below (see PT Problem List). Pt will benefit from skilled PT to increase their independence and safety with mobility to allow discharge to the venue listed below.      Follow Up Recommendations SNF;Supervision/Assistance - 24 hour    Equipment Recommendations  None recommended by PT (TBD by next venue of care)    Recommendations for Other Services       Precautions / Restrictions Precautions Precautions: Back Precaution Booklet Issued: Yes (comment) Precaution Comments: Hand out provided and precautions reviewed Required Braces or Orthoses: Spinal Brace Spinal Brace: Lumbar corset;Applied in sitting position Restrictions Weight Bearing Restrictions: No      Mobility  Bed Mobility Overal bed mobility: Needs Assistance Bed Mobility: Rolling;Sidelying to Sit Rolling: Min guard Sidelying to sit: Mod assist       General bed mobility comments: Assist to elevate trunk to full sitting position.    Transfers Overall transfer level: Needs assistance Equipment used: Rolling walker (2 wheeled) Transfers: Sit to/from Stand Sit to Stand: Min assist         General transfer comment: Assist from elevated bed height to pwoer-up to full stand. VC's for hand placement on seated surface for  safety.  Ambulation/Gait Ambulation/Gait assistance: Min guard;Min assist Gait Distance (Feet): 75 Feet Assistive device: Rolling walker (2 wheeled) Gait Pattern/deviations: Step-through pattern;Decreased stride length;Trunk flexed Gait velocity: Decreased Gait velocity interpretation: <1.31 ft/sec, indicative of household ambulator General Gait Details: Occasional assist for walker management. 3/4 DOE by the end of gait training.  Stairs            Wheelchair Mobility    Modified Rankin (Stroke Patients Only)       Balance Overall balance assessment: Needs assistance Sitting-balance support: Feet supported;Bilateral upper extremity supported Sitting balance-Leahy Scale: Fair     Standing balance support: Bilateral upper extremity supported Standing balance-Leahy Scale: Poor Standing balance comment: Pt requires BUE support in standing for balance both statically and dynamically.                             Pertinent Vitals/Pain Pain Assessment: 0-10 Pain Score: 7  Pain Location: Incision site Pain Descriptors / Indicators: Aching;Constant;Discomfort;Operative site guarding Pain Intervention(s): Limited activity within patient's tolerance;Monitored during session;Repositioned    Home Living Family/patient expects to be discharged to:: Private residence Living Arrangements: Spouse/significant other Available Help at Discharge: Family;Available 24 hours/day Type of Home: House Home Access: Stairs to enter Entrance Stairs-Rails: Can reach both Entrance Stairs-Number of Steps: 4 steps Home Layout: Two level;Able to live on main level with bedroom/bathroom Home Equipment: Shower seat - built in      Prior Function Level of Independence: Independent         Comments: Pt reports independence mostly, with needing wifes assistance some days due to increased pain.  Hand Dominance   Dominant Hand: Right    Extremity/Trunk Assessment   Upper  Extremity Assessment Upper Extremity Assessment: Defer to OT evaluation    Lower Extremity Assessment Lower Extremity Assessment: Generalized weakness (Consistent with pre-op diagnosis)    Cervical / Trunk Assessment Cervical / Trunk Assessment: Other exceptions Cervical / Trunk Exceptions: s/p lumbar surgery  Communication   Communication: No difficulties;HOH  Cognition Arousal/Alertness: Awake/alert Behavior During Therapy: WFL for tasks assessed/performed Overall Cognitive Status: Within Functional Limits for tasks assessed                                        General Comments      Exercises     Assessment/Plan    PT Assessment Patient needs continued PT services  PT Problem List Decreased strength;Decreased activity tolerance;Decreased balance;Decreased mobility;Decreased knowledge of use of DME;Decreased safety awareness;Decreased knowledge of precautions;Pain       PT Treatment Interventions DME instruction;Gait training;Stair training;Functional mobility training;Therapeutic activities;Therapeutic exercise;Neuromuscular re-education;Patient/family education    PT Goals (Current goals can be found in the Care Plan section)  Acute Rehab PT Goals Patient Stated Goal: To go home PT Goal Formulation: With patient/family Time For Goal Achievement: 10/03/20 Potential to Achieve Goals: Good    Frequency Min 5X/week   Barriers to discharge        Co-evaluation               AM-PAC PT "6 Clicks" Mobility  Outcome Measure Help needed turning from your back to your side while in a flat bed without using bedrails?: A Little Help needed moving from lying on your back to sitting on the side of a flat bed without using bedrails?: A Lot Help needed moving to and from a bed to a chair (including a wheelchair)?: A Little Help needed standing up from a chair using your arms (e.g., wheelchair or bedside chair)?: A Little Help needed to walk in hospital  room?: A Little Help needed climbing 3-5 steps with a railing? : A Lot 6 Click Score: 16    End of Session Equipment Utilized During Treatment: Gait belt;Back brace Activity Tolerance: Patient tolerated treatment well Patient left: in chair;with call bell/phone within reach Nurse Communication: Mobility status PT Visit Diagnosis: Unsteadiness on feet (R26.81);Pain Pain - part of body:  (back)    Time: 1415-1446 PT Time Calculation (min) (ACUTE ONLY): 31 min   Charges:   PT Evaluation $PT Eval Moderate Complexity: 1 Mod PT Treatments $Gait Training: 8-22 mins        [name], PT, DPT Acute Rehabilitation Services Pager: [phone] Office: [phone]   [name] 09/19/2020, 3:07 PM

## 2020-09-19 NOTE — Progress Notes (Signed)
Inpatient Diabetes Program Recommendations  AACE/ADA: New Consensus Statement on Inpatient Glycemic Control (2015)  Target Ranges:  Prepandial:   less than 140 mg/dL      Peak postprandial:   less than 180 mg/dL (1-2 hours)      Critically ill patients:  140 - 180 mg/dL   Lab Results  Component Value Date   GLUCAP 182 (H) 09/19/2020   HGBA1C 8.4 (H) 09/12/2020    Review of Glycemic Control Results for RYELIN, CHAPARRO (MRN [id number]) as of 09/19/2020 10:08  Ref. Range 09/18/2020 09:31 09/18/2020 17:37 09/18/2020 21:06 09/19/2020 06:39  Glucose-Capillary Latest Ref Range: 70 - 99 mg/dL 110 (H) 188 (H) 211 (H) 182 (H)   Diabetes history: DM 2 Outpatient Diabetes medications:  Glucotrol 20 mg bid, Levemir 35 units daily, Jardiance 10 mg daily, Victoza 1.8 mg q HS Current orders for Inpatient glycemic control:  Jardiance 10 mg daily Glucotrol 20 mg bid Novolog resistant tid with meals Levemir 35 units q HS  Inpatient Diabetes Program Recommendations:    Note blood sugars increased after Decadron 10 mg x 1 prior to surgery.  Novolog correction increased to resistant tid with meals.  Will follow.   Thanks,  Adah Perl, RN, BC-ADM Inpatient Diabetes Coordinator Pager [phone]  (8a-5p)

## 2020-09-19 NOTE — TOC Initial Note (Signed)
Transition of Care [facility]) - Initial/Assessment Note    Patient Details  Name: Ricky Nelson MRN: [id number] Date of Birth: February 20, 1935  Transition of [facility]) CM/SW Contact:    Joanne Chars, LCSW Phone Number: 09/19/2020, 3:33 PM  Clinical Narrative:  CSW me with pt wife Ricky Nelson and with son regarding recommendation for SNF.  Pt asleep and family asks that CSW not wake him.  They are requesting SNF placement as pt wife not able to provide enough assistance at home.  Choice document given.  Pt is vaccinated for covid with one booster.  No current DME in home.                   Expected Discharge Plan: Skilled Nursing Facility Barriers to Discharge: SNF Pending bed offer   Patient Goals and CMS Choice   CMS Medicare.gov Compare Post Acute Care list provided to:: Patient Represenative (must comment) Choice offered to / list presented to : Spouse  Expected Discharge Plan and Services Expected Discharge Plan: [place] In-house Referral: Clinical Social Work   Post Acute Care Choice: [place] Living arrangements for the past 2 months: [place]                                      Prior Living Arrangements/Services Living arrangements for the past 2 months: Single Family Home Lives with:: Spouse Patient language and need for interpreter reviewed:: No        Need for Family Participation in Patient Care: Yes (Comment) Care giver support system in place?: Yes (comment) Current home services: Other (comment) (na) Criminal Activity/Legal Involvement Pertinent to Current Situation/Hospitalization: No - Comment as needed  Activities of Daily Living      Permission Sought/Granted                  Emotional Assessment Appearance:: Appears stated age Attitude/Demeanor/Rapport: Unable to Assess Affect (typically observed): Unable to Assess Orientation: : Oriented to Self, Oriented to Place, Oriented to  Time, Oriented  to Situation Alcohol / Substance Use: Not Applicable Psych Involvement: No (comment)  Admission diagnosis:  Lumbar spinal stenosis [M48.061] Patient Active Problem List   Diagnosis Date Noted   Lumbar spinal stenosis 09/18/2020   PCP:  Pcp, No Pharmacy:   [place], [place] [address] Phone: [phone] Fax: [phone]     Social Determinants of Health (SDOH) Interventions    Readmission Risk Interventions No flowsheet data found.

## 2020-09-19 NOTE — Evaluation (Signed)
Occupational Therapy Evaluation Patient Details Name: Ricky Nelson MRN: [id number] DOB: 10-Jan-1936 Today's Date: 09/19/2020    History of Present Illness 85 y.o. male admitted to [facility] on 8/30 for L2-4 fusion with extension of hardware L2-L5. PMH significant for HTN, DM2, Bladder cancer, MI in 1995, Sleep apnea.   Clinical Impression   Pt admitted for procedure listed above. PTA pt reported that he was independent with all ADL's, however at times he would ask his wife to assist him due to increased pain in his back and BLE. At this time, pt requiring min guard-min A for all sit<>stands, and min A-Mod A for all LB ADL's due to balance concerns, back precautions, and pain. Wife is able to provide assistance for ADL's. Pt will benefit from [facility] to ensure safety and return to independence once home. OT will follow acutely.     Follow Up Recommendations  Home health OT;Supervision - Intermittent    Equipment Recommendations  Other (comment) (RW)    Recommendations for Other Services       Precautions / Restrictions Precautions Precautions: Back Precaution Booklet Issued: Yes (comment) Precaution Comments: Hand out provided and precautions reviewed Required Braces or Orthoses: Spinal Brace Spinal Brace: Lumbar corset;Applied in sitting position Restrictions Weight Bearing Restrictions: No      Mobility Bed Mobility Overal bed mobility: Needs Assistance Bed Mobility: Rolling;Sidelying to Sit Rolling: Min guard Sidelying to sit: Min guard       General bed mobility comments: Pt requiring min guard assist for safety and education on proper log roll technique.    Transfers Overall transfer level: Needs assistance Equipment used: Rolling walker (2 wheeled) Transfers: Sit to/from Stand Sit to Stand: Min assist;Min guard         General transfer comment: Pt requires min A to stand from bed x4 and min G to stand from toilet in bathroom    Balance Overall balance assessment:  Needs assistance Sitting-balance support: Feet supported;Bilateral upper extremity supported Sitting balance-Leahy Scale: Fair     Standing balance support: Bilateral upper extremity supported Standing balance-Leahy Scale: Poor Standing balance comment: Pt requires BUE support in standing for balance both statically and dynamically.                           ADL either performed or assessed with clinical judgement   ADL Overall ADL's : Needs assistance/impaired Eating/Feeding: Independent;Sitting   Grooming: Modified independent;Standing   Upper Body Bathing: Modified independent;Sitting   Lower Body Bathing: Minimal assistance;Sit to/from stand;Sitting/lateral leans   Upper Body Dressing : Modified independent;Sitting   Lower Body Dressing: Moderate assistance;Sitting/lateral leans;Sit to/from stand   Toilet Transfer: Min guard;Ambulation   Toileting- Clothing Manipulation and Hygiene: Minimal assistance;Sitting/lateral lean;Sit to/from stand   Tub/ Shower Transfer: Min guard;Ambulation   Functional mobility during ADLs: Min guard;Rolling walker General ADL Comments: Pt moves slowly and cautiously, reporting that he is worried about hurting his back. requires assist for LB ADL's due to being unable to bend, wife is able to provide this assistance easily.     Vision Baseline Vision/History: 1 Wears glasses Ability to See in Adequate Light: 0 Adequate Patient Visual Report: No change from baseline Vision Assessment?: No apparent visual deficits     Perception Perception Perception Tested?: No   Praxis Praxis Praxis tested?: Not tested    Pertinent Vitals/Pain Pain Assessment: Faces Faces Pain Scale: Hurts even more Pain Location: Back @ surgical site Pain Descriptors / Indicators: Aching;Constant;Discomfort  Pain Intervention(s): Monitored during session;Patient requesting pain meds-RN notified     Hand Dominance Right   Extremity/Trunk Assessment  Upper Extremity Assessment Upper Extremity Assessment: Generalized weakness   Lower Extremity Assessment Lower Extremity Assessment: Defer to PT evaluation   Cervical / Trunk Assessment Cervical / Trunk Assessment: Other exceptions Cervical / Trunk Exceptions: s/p lumbar surgery   Communication Communication Communication: No difficulties;HOH   Cognition Arousal/Alertness: Awake/alert Behavior During Therapy: WFL for tasks assessed/performed Overall Cognitive Status: Within Functional Limits for tasks assessed                                     General Comments  VSS on RA    Exercises Exercises: General Lower Extremity General Exercises - Lower Extremity Short Arc Quad: AROM;Both;10 reps;Seated Hip Flexion/Marching: AROM;Both;10 reps;Seated   Shoulder Instructions      Home Living Family/patient expects to be discharged to:: Private residence Living Arrangements: Spouse/significant other Available Help at Discharge: Family;Available 24 hours/day Type of Home: House Home Access: Stairs to enter [employer] of Steps: 4 steps Entrance Stairs-Rails: Can reach both Home Layout: Two level;Able to live on main level with bedroom/bathroom     Bathroom Shower/Tub: Occupational psychologist: Standard Bathroom Accessibility: Yes How Accessible: Accessible via walker Home Equipment: Shower seat - built in          Prior Functioning/Environment Level of Independence: Independent        Comments: Pt reports independence mostly, with needing wifes assistance some days due to increased pain.        OT Problem List: Decreased strength;Decreased activity tolerance;Impaired balance (sitting and/or standing);Decreased coordination;Decreased safety awareness;Decreased knowledge of use of DME or AE;Pain      OT Treatment/Interventions: Self-care/ADL training;Therapeutic exercise;DME and/or AE instruction;Energy conservation;Therapeutic  activities;Patient/family education;Balance training    OT Goals(Current goals can be found in the care plan section) Acute Rehab OT Goals Patient Stated Goal: To go home OT Goal Formulation: With patient/family Time For Goal Achievement: 10/03/20 Potential to Achieve Goals: Good ADL Goals Pt Will Perform Lower Body Bathing: with modified independence;with adaptive equipment;sitting/lateral leans;sit to/from stand Pt Will Perform Lower Body Dressing: with modified independence;with adaptive equipment;sitting/lateral leans;sit to/from stand Pt Will Transfer to Toilet: with modified independence;ambulating Pt Will Perform Toileting - Clothing Manipulation and hygiene: with modified independence;with adaptive equipment;sitting/lateral leans;sit to/from stand  OT Frequency: Min 2X/week   Barriers to D/C:            Co-evaluation              AM-PAC OT "6 Clicks" Daily Activity     Outcome Measure Help from another person eating meals?: None Help from another person taking care of personal grooming?: None Help from another person toileting, which includes using toliet, bedpan, or urinal?: A Lot Help from another person bathing (including washing, rinsing, drying)?: A Lot Help from another person to put on and taking off regular upper body clothing?: None Help from another person to put on and taking off regular lower body clothing?: A Lot 6 Click Score: 18   End of Session Equipment Utilized During Treatment: Rolling walker;Back brace Nurse Communication: Mobility status  Activity Tolerance: Patient tolerated treatment well Patient left: in bed;with call bell/phone within reach;with family/visitor present  OT Visit Diagnosis: Unsteadiness on feet (R26.81);Other abnormalities of gait and mobility (R26.89);Muscle weakness (generalized) (M62.81);Pain Pain - Right/Left:  (Back) Pain - part  of body:  (Back)                Time: [id number] OT Time Calculation (min): 63  min Charges:  OT General Charges $OT Visit: 1 Visit OT Evaluation $OT Eval Moderate Complexity: 1 Mod OT Treatments $Self Care/Home Management : 23-37 mins $Therapeutic Activity: 8-22 mins  Nylen Creque H., OTR/L Acute Rehabilitation  Mackinsey Pelland Elane Carlena Ruybal 09/19/2020, 10:41 AM

## 2020-09-19 NOTE — Op Note (Signed)
NEUROSURGERY OPERATIVE NOTE   PREOP DIAGNOSIS:  1. Lumbar spondylosis, L2-3, L3-4 2. Lumbar stenosis with neurogenic claudication  POSTOP DIAGNOSIS: Same  PROCEDURE: 1. L2, L3 laminectomy with facetectomy for decompression of exiting nerve roots, more than would be required for placement of interbody graft 2. Placement of anterior interbody device - Medtronic epandable 65m cage x4 3. Posterior segmental instrumentation using Medtronic Solera pedicle screws and previously place Stryker D90 screws, L2 - L5 4. Interbody arthrodesis, L2-3, L3-4 5. Use of locally harvested bone autograft 6. Use of non-structural bone allograft - ProteOs 7. Removal of previous rod, L4-5  SURGEON: Dr. NConsuella Lose MD  ASSISTANT: Dr. HEarnie Larsson MD  ANESTHESIA: General Endotracheal  EBL: 800cc  SPECIMENS: None  DRAINS: None  COMPLICATIONS: None immediate  CONDITION: Hemodynamically stable to PACU  HISTORY: Ricky STEELYis a 85y.o. male who has been followed in the outpatient clinic with back and leg pain related to spondylosis with multifactorial stenosis at L2-3 and L3-4.  This is in the setting of previous decompression and fusion several years ago by Dr. NSherwood Gamblerat L4-5. multiple conservative treatments were attempted without significant improvement and the patient ultimately elected to proceed with surgical decompression and fusion with extension of hardware to L5. Risks, benefits, and alternative treatments were reviewed in detail in the office. After all questions were answered, informed consent was obtained and witnessed.  PROCEDURE IN DETAIL: The patient was brought to the operating room via stretcher. After induction of general anesthesia, the patient was positioned on the operative table in the prone position. All pressure points were meticulously padded. Incision was then marked out and prepped and draped in the usual sterile fashion.  After timeout was conducted, the previous  skin incision was infiltrated with local anesthetic. Skin incision was then made sharply and extended superiorly, and Bovie electrocautery was used to dissect the subcutaneous tissue until the lumbodorsal fascia was identified and incised. The muscle was then elevated in the subperiosteal plane and the L3 spinous process and associated lamina were identified.  Dissection was carried laterally to identify the previously placed L4 and L5 pedicle screws.  The L2 lamina was then dissected.  The facet complexes at L2-3 were identified as was the inferior margin of the facet complex at L1 to as well as the transverse process at L2 and L3 in order to identify pedicle screw entry points.  Self-retaining retractor was then placed.  At this point, the dissection was carried out along the previously placed hardware to identify the setscrews and intervening rod at L4 and L5.  At this point attention was turned to decompression. Complete laminectomy at L2 and L3 was completed utilizing the high-speed drill and Kerrison punches.  Once this was completed, the pars interarticularis was identified at L2 and L3.  The high-speed drill was then used to cut across the pars at both levels bilaterally in order to remove the inferior articulating process of L2 and L3.  There was a significant amount of ligamentous hypertrophy underneath the lamina as well as at the medial aspect of the facet complexes causing a fair amount of compression of the thecal sac.  Kerrison punches were used to remove the medial portion of the superior articulating process of L4 and L3 in order to fully decompress the lateral recess.  This also allowed identification of the disc spaces at L2 and L3.  The exiting nerve roots at L2 and L3 were also identified and fully decompressed.   After completing  the decompression, the disc spaces were incised bilaterally at both levels.  Using a combination of curettes and rongeurs, as well as disc space shavers,  complete discectomy was completed at both levels bilaterally.  There was an osteophyte emanating from the posterior aspect of L3 to covering the L2-3 disc space.  This was easily opened up with a osteotome however placement of a small 6 mm disc space shaver on the left side at this level may have violated the superior endplate posteriorly and laterally at L3.  After complete discectomy, endplates were prepared at both levels using rasps and curettes.  The disc spaces were then packed with a combination of locally harvested bone autograft which was morselized in a bone mill mixed with Proteos.  Cages were then sized and 7 mm cages were found to be appropriate at both levels.  Cages were then inserted bilaterally and expanded with fluoroscopic guidance to achieve good endplate apposition and maintenance of lumbar lordosis.  Cages at both levels bilaterally were then backfilled with more autograft.  At this point attention was turned to placement of the posterior instrumentation.  Using a combination of standard anatomic landmarks and the assistance of lateral fluoroscopy, pilot holes were drilled for pedicle screws with standard trajectory at L2 and L3 bilaterally.  Pilot holes were tapped to 4.5 x 45 mm on the left at L2 and 5.5 x 45 mm on the right at L2 and bilaterally at L3.  Appropriate sized pedicle screws were then placed at L2 and L3 bilaterally.  The previously placed setscrews were removed and the previous rod was removed.  A new pre-bent lordotic rod measuring approximately 90 mm was placed into the setscrews at all levels bilaterally.  New set screws were placed at L2 and L3, and the previously removed setscrews were placed at the lower levels L4 and L5.  Setscrews were then final tightened.  Final AP and lateral fluoroscopic images were taken confirming good placement of the implanted hardware.  Hemostasis was secured and confirmed with bipolar cautery and morcellized gelfoam with thrombin. The  wound was then irrigated with copious amounts of antibiotic saline, then closed in standard fashion using a combination of interrupted 0 and 3-0 Vicryl stitches in the muscular, fascial, and subcutaneous layers. Skin was then closed using standard Dermabond. Sterile dressing was then applied. The patient was then transferred to the stretcher, extubated, and taken to the postanesthesia care unit in stable hemodynamic condition.  At the end of the case all sponge, needle, cottonoid, and instrument counts were correct.   Consuella Lose, MD [facility]

## 2020-09-19 NOTE — Progress Notes (Signed)
  NEUROSURGERY PROGRESS NOTE   No issues overnight. Pt reports back soreness, some leg pain. Has ambulated with rolling walker to doorway before feeling weak.  EXAM:  BP (!) 146/74 (BP Location: Left Arm)   Pulse 88   Temp 98 F (36.7 C) (Oral)   Resp 20   Ht '5\' 9"'$  (1.753 m)   Wt 98.2 kg   SpO2 100%   BMI 31.96 kg/m   Awake, alert, oriented  Speech fluent, appropriate  CN grossly intact  5/5 BUE/BLE   IMPRESSION:  85 y.o. male POD#1 L2-4 fusion with extension of hardware L2-L5. Recovering as expected given age.  PLAN: - Cont to mobilize with PT/OT - glycemic control will increase sliding scale   Consuella Lose, MD [facility]

## 2020-09-19 NOTE — NC FL2 (Signed)
[place] LEVEL OF CARE SCREENING TOOL     IDENTIFICATION  Patient Name: Ricky Nelson Birthdate: 15-Mar-1935 Sex: male Admission Date (Current Location): 09/18/2020  [facility] and [place] Number:  Herbalist and Address:  [facility], [place] [address] [place], [place] [address]      Provider Number: [id number]  Attending Physician Name and Address:  Consuella Lose, MD  Relative Name and Phone Number:  CASTLE, DOUGALL Spouse [phone]  [phone]    Current Level of Care: Hospital Recommended Level of Care: [place] Prior Approval Number:    Date Approved/Denied:   PASRR Number: [id number]  Discharge Plan: SNF    Current Diagnoses: Patient Active Problem List   Diagnosis Date Noted   Lumbar spinal stenosis 09/18/2020    Orientation RESPIRATION BLADDER Height & Weight     Self, Time, Situation, Place  O2 Continent Weight: 216 lb 6.4 oz (98.2 kg) Height:  '5\' 9"'$  (175.3 cm)  BEHAVIORAL SYMPTOMS/MOOD NEUROLOGICAL BOWEL NUTRITION STATUS      Continent Diet (see discharge summary)  AMBULATORY STATUS COMMUNICATION OF NEEDS Skin   Limited Assist Verbally Surgical wounds                       Personal Care Assistance Level of Assistance  Bathing, Feeding, Dressing Bathing Assistance: Limited assistance Feeding assistance: Independent Dressing Assistance: Limited assistance     Functional Limitations Info  Sight, Hearing, Speech Sight Info: Adequate Hearing Info: Adequate Speech Info: Adequate    SPECIAL CARE FACTORS FREQUENCY  PT (By licensed PT), OT (By licensed OT)     PT Frequency: 5x week OT Frequency: 5x week            Contractures Contractures Info: Not present    Additional Factors Info  Code Status, Allergies, Insulin Sliding Scale Code Status Info: full Allergies Info: Allergy Relief (Chlorpheniramine Maleate), Diazepam, Diphenhydramine Hcl, Diphenhydramine-zinc  Acetate, Atorvastatin, Fluvastatin, Lisinopril, Metformin And Related, Piperacillin-tazobactam In Dex, Diphenhydramine, Other   Insulin Sliding Scale Info: Novolog, 0-20 units 3x day with meals.  See discharge summary.       Current Medications (09/19/2020):  This is the current hospital active medication list Current Facility-Administered Medications  Medication Dose Route Frequency Provider Last Rate Last Admin   0.9 %  sodium chloride infusion   Intravenous Continuous Nundkumar, Nena Polio, MD       0.9 %  sodium chloride infusion  250 mL Intravenous Continuous Consuella Lose, MD       acetaminophen (TYLENOL) tablet 650 mg  650 mg Oral Q4H PRN Consuella Lose, MD   650 mg at 09/19/20 0604   Or   acetaminophen (TYLENOL) suppository 650 mg  650 mg Rectal Q4H PRN Consuella Lose, MD       bisacodyl (DULCOLAX) suppository 10 mg  10 mg Rectal Daily PRN Consuella Lose, MD       buPROPion (WELLBUTRIN XL) 24 hr tablet 150 mg  150 mg Oral q morning Consuella Lose, MD   150 mg at 09/19/20 1049   docusate sodium (COLACE) capsule 100 mg  100 mg Oral Daily Consuella Lose, MD   100 mg at 09/19/20 1049   DULoxetine (CYMBALTA) DR capsule 60 mg  60 mg Oral Daily Consuella Lose, MD   60 mg at 09/19/20 1049   empagliflozin (JARDIANCE) tablet 10 mg  10 mg Oral Daily Consuella Lose, MD   10 mg at 09/19/20 1027   fluticasone (FLONASE)  50 MCG/ACT nasal spray 1 spray  1 spray Each Nare Daily PRN Consuella Lose, MD       gabapentin (NEURONTIN) capsule 300 mg  300 mg Oral TID Consuella Lose, MD   300 mg at 09/19/20 1028   glipiZIDE (GLUCOTROL) tablet 20 mg  20 mg Oral BID AC Consuella Lose, MD   20 mg at 09/19/20 [id number]   insulin aspart (novoLOG) injection 0-20 Units  0-20 Units Subcutaneous TID WC Consuella Lose, MD   7 Units at 09/19/20 1244   insulin detemir (LEVEMIR) injection 35 Units  35 Units Subcutaneous Q2200 Consuella Lose, MD   35 Units at 09/18/20 2159    ipratropium-albuterol (DUONEB) 0.5-2.5 (3) MG/3ML nebulizer solution 3 mL  3 mL Inhalation BID Consuella Lose, MD   3 mL at 09/19/20 [id number]   isosorbide mononitrate (IMDUR) 24 hr tablet 30 mg  30 mg Oral Daily Consuella Lose, MD   30 mg at 09/19/20 1049   ketorolac (ACULAR) 0.5 % ophthalmic solution 1 drop  1 drop Left Eye Daily Consuella Lose, MD       liraglutide (VICTOZA) SOPN 1.8 mg  1.8 mg Subcutaneous QHS Consuella Lose, MD       magnesium oxide (MAG-OX) tablet 400 mg  400 mg Oral BID Consuella Lose, MD   400 mg at 09/19/20 1049   menthol-cetylpyridinium (CEPACOL) lozenge 3 mg  1 lozenge Oral PRN Consuella Lose, MD       Or   phenol (CHLORASEPTIC) mouth spray 1 spray  1 spray Mouth/Throat PRN Consuella Lose, MD       methocarbamol (ROBAXIN) tablet 500 mg  500 mg Oral Q6H PRN Consuella Lose, MD   500 mg at 09/19/20 1030   Or   methocarbamol (ROBAXIN) 500 mg in dextrose 5 % 50 mL IVPB  500 mg Intravenous Q6H PRN Consuella Lose, MD       metoprolol succinate (TOPROL-XL) 24 hr tablet 50 mg  50 mg Oral Daily Consuella Lose, MD   50 mg at 09/19/20 1048   morphine 4 MG/ML injection 4 mg  4 mg Intravenous Q2H PRN Consuella Lose, MD       multivitamin with minerals tablet 1 tablet  1 tablet Oral Daily Consuella Lose, MD   1 tablet at 09/19/20 1050   nitroGLYCERIN (NITROSTAT) SL tablet 0.4 mg  0.4 mg Sublingual Q5 min PRN Consuella Lose, MD       omega-3 acid ethyl esters (LOVAZA) capsule 1 g  1 g Oral Daily Consuella Lose, MD   1 g at 09/19/20 1051   ondansetron (ZOFRAN) tablet 4 mg  4 mg Oral Q6H PRN Consuella Lose, MD       Or   ondansetron (ZOFRAN) injection 4 mg  4 mg Intravenous Q6H PRN Consuella Lose, MD   4 mg at 09/18/20 2147   oxyCODONE (Oxy IR/ROXICODONE) immediate release tablet 10 mg  10 mg Oral Q3H PRN Consuella Lose, MD       oxyCODONE (Oxy IR/ROXICODONE) immediate release tablet 5 mg  5 mg Oral Q3H PRN Consuella Lose, MD   5 mg at 09/19/20 1049   pantoprazole (PROTONIX) EC tablet 40 mg  40 mg Oral QHS Consuella Lose, MD   40 mg at 09/18/20 2133   rosuvastatin (CRESTOR) tablet 20 mg  20 mg Oral Q2200 Consuella Lose, MD   20 mg at 09/18/20 2133   senna (SENOKOT) tablet 8.6 mg  1 tablet Oral BID Consuella Lose, MD   8.6 mg at  09/19/20 1048   senna-docusate (Senokot-S) tablet 1 tablet  1 tablet Oral QHS PRN Consuella Lose, MD       sodium chloride flush (NS) 0.9 % injection 3 mL  3 mL Intravenous Q12H Consuella Lose, MD   3 mL at 09/19/20 1051   sodium chloride flush (NS) 0.9 % injection 3 mL  3 mL Intravenous PRN Consuella Lose, MD       triamcinolone cream (KENALOG) 0.1 % cream 1 application  1 application Topical BID PRN Consuella Lose, MD       vitamin B-12 (CYANOCOBALAMIN) tablet 1,000 mcg  1,000 mcg Oral Daily Consuella Lose, MD   1,000 mcg at 09/19/20 1049     Discharge Medications: Please see discharge summary for a list of discharge medications.  Relevant Imaging Results:  Relevant Lab Results:   Additional Information SSN: [id number].  Pt is vaccinated for covid with one booster.  Joanne Chars, LCSW

## 2020-09-20 LAB — GLUCOSE, CAPILLARY
Glucose-Capillary: 204 mg/dL — ABNORMAL HIGH (ref 70–99)
Glucose-Capillary: 287 mg/dL — ABNORMAL HIGH (ref 70–99)
Glucose-Capillary: 227 mg/dL — ABNORMAL HIGH (ref 70–99)
Glucose-Capillary: 181 mg/dL — ABNORMAL HIGH (ref 70–99)

## 2020-09-20 MED ORDER — KETOROLAC TROMETHAMINE 0.5 % OP SOLN
1.0000 [drp] | Freq: Every day | OPHTHALMIC | Status: DC
  Administered 2020-09-21 – 2020-10-11 (×20): 1 [drp] via OPHTHALMIC
  Filled 2020-09-20: qty 5

## 2020-09-20 NOTE — Progress Notes (Signed)
RN was unable to keep patient awake when trying to pass the 1700/1800 meds. Patient would respond to his name being called but would not open his eyes. Charge RN was notified and RN was instructed to call the Rapid Response RN to round on pt. Pt was assessed by the Rapid Response RN and Dr. Kathyrn Sheriff was notified about pt's current status by RN. Dr. Kathyrn Sheriff gave a verbal order for the pt to remain on his CPAP for the time being and to be increased from 24m/hr to 100 mL/hr. All orders were implemented. Pt's vital signs are stable at this time and he was covered for his glucose reading of 227 with 7 units of novolog.

## 2020-09-20 NOTE — Progress Notes (Signed)
Physical Therapy Treatment Patient Details Name: Ricky Nelson MRN: [id number] DOB: 01-Apr-1935 Today's Date: 09/20/2020    History of Present Illness Pt is an 85 y/o male admitted to [facility] on 8/30 for L2-4 fusion with extension of hardware L2-L5. PMH significant for HTN, DM2, Bladder cancer, MI in 1995, Sleep apnea.    PT Comments    Pt progressing well with post-op mobility. He was able to demonstrate transfers with up to +2 mod assist for power-up to full stand from the low recliner chair. Min assist for walker management, balance and safety during ambulation. Pt was educated on precautions, brace application/wearing schedule, appropriate activity progression, and car transfer. Will continue to follow.      Follow Up Recommendations  SNF;Supervision/Assistance - 24 hour     Equipment Recommendations  None recommended by PT (TBD by next venue of care)    Recommendations for Other Services       Precautions / Restrictions Precautions Precautions: Back Precaution Booklet Issued: Yes (comment) Precaution Comments: Hand out provided and precautions reviewed Required Braces or Orthoses: Spinal Brace Spinal Brace: Lumbar corset;Applied in sitting position Restrictions Weight Bearing Restrictions: No    Mobility  Bed Mobility Overal bed mobility: Needs Assistance Bed Mobility: Rolling;Sit to Sidelying Rolling: Min guard Sidelying to sit: Max assist     Sit to sidelying: Mod assist General bed mobility comments: Assist for LE elevation back up into bed at end of session.    Transfers Overall transfer level: Needs assistance Equipment used: Rolling walker (2 wheeled) Transfers: Sit to/from Stand Sit to Stand: Mod assist;+2 physical assistance;Min assist         General transfer comment: VC's for hand placement on seated surface for safety. +2 mod assist to power-up to full stand from recliner. Min assist for controlled lower down to EOB at end of  session.  Ambulation/Gait Ambulation/Gait assistance: Min assist Gait Distance (Feet): 75 Feet Assistive device: Rolling walker (2 wheeled) Gait Pattern/deviations: Step-through pattern;Decreased stride length;Trunk flexed Gait velocity: Decreased Gait velocity interpretation: <1.31 ft/sec, indicative of household ambulator General Gait Details: Occasional assist for walker management. 3/4 DOE by the end of gait training.   Stairs             Wheelchair Mobility    Modified Rankin (Stroke Patients Only)       Balance Overall balance assessment: Needs assistance Sitting-balance support: Feet supported;Bilateral upper extremity supported Sitting balance-Leahy Scale: Poor Sitting balance - Comments: Pt leaning to the R and requring mod-max support to maintain midline posture. Postural control: Right lateral lean Standing balance support: Bilateral upper extremity supported Standing balance-Leahy Scale: Poor Standing balance comment: Pt requires BUE support in standing for balance both statically and dynamically.                            Cognition Arousal/Alertness: Awake/alert Behavior During Therapy: WFL for tasks assessed/performed Overall Cognitive Status: Impaired/Different from baseline Area of Impairment: Safety/judgement;Awareness;Following commands;Attention                   Current Attention Level: Selective   Following Commands: Follows one step commands inconsistently Safety/Judgement: Decreased awareness of safety Awareness: Emergent   General Comments: Requires increased cues for 1-step commands this session. Appears confused at times and hyperfixated on his foley catheter.      Exercises      General Comments General comments (skin integrity, edema, etc.): VSS onRA  Pertinent Vitals/Pain Pain Assessment: Faces Faces Pain Scale: Hurts even more Pain Location: back and BLE Pain Descriptors / Indicators:  Aching;Constant;Discomfort;Cramping Pain Intervention(s): Limited activity within patient's tolerance;Monitored during session;Repositioned    Home Living                      Prior Function            PT Goals (current goals can now be found in the care plan section) Acute Rehab PT Goals Patient Stated Goal: To go home PT Goal Formulation: With patient/family Time For Goal Achievement: 10/03/20 Potential to Achieve Goals: Good Progress towards PT goals: Progressing toward goals    Frequency    Min 5X/week      PT Plan Current plan remains appropriate    Co-evaluation              AM-PAC PT "6 Clicks" Mobility   Outcome Measure  Help needed turning from your back to your side while in a flat bed without using bedrails?: A Little Help needed moving from lying on your back to sitting on the side of a flat bed without using bedrails?: A Lot Help needed moving to and from a bed to a chair (including a wheelchair)?: A Little Help needed standing up from a chair using your arms (e.g., wheelchair or bedside chair)?: Total Help needed to walk in hospital room?: A Little Help needed climbing 3-5 steps with a railing? : Total 6 Click Score: 13    End of Session Equipment Utilized During Treatment: Gait belt;Back brace Activity Tolerance: Patient tolerated treatment well Patient left: in chair;with call bell/phone within reach Nurse Communication: Mobility status PT Visit Diagnosis: Unsteadiness on feet (R26.81);Pain Pain - part of body:  (back)     Time: [id number] PT Time Calculation (min) (ACUTE ONLY): 37 min  Charges:  $Gait Training: 23-37 mins                     [name], PT, DPT Acute Rehabilitation Services Pager: [phone] Office: [phone]    [name] 09/20/2020, 12:08 PM

## 2020-09-20 NOTE — Plan of Care (Signed)
  Problem: Education: Goal: Ability to verbalize activity precautions or restrictions will improve Outcome: Progressing Goal: Knowledge of the prescribed therapeutic regimen will improve Outcome: Progressing Goal: Understanding of discharge needs will improve Outcome: Progressing   

## 2020-09-20 NOTE — Significant Event (Signed)
Rapid Response Event Note   Reason for Call :  Pt remains drowsy since arrival to 5N today around 1500 today. Pt received OxyIR '5mg'$  at 1246.   Initial Focused Assessment:  Pt lying in bed. Opens eyes to voice, oriented to person. Eye opening is nonsustained. Pt quickly falls asleep when not stimulated. Lung sounds are clear. Breathing is regular, unlabored. Skin is warm, dry, pink. No adventitious heart sounds, heart rate is regular.   Pt noted to not have any UOP since arrival to 5N at 1500. Bladder scan revealed 60m in pt's bladder. IVF at 75cc/hr.   VS: T 98.25F, BP 146/85, HR 82, RR 24, SPO2 95% on 3LNC  Interventions:  -CPAP applied 5L/ 5 EPAP -Continuous pulse oximetry monitoring  Plan of Care:  -CPAP while pt is sleeping- day and night -Close monitoring for improvement in pt mentation -Promote sleep, wake cycle  Call rapid response for additional needs  Event Summary:  MD Notified: Per RN Call Time: [id number] Time: 1815 End Time: 1[place] RN

## 2020-09-20 NOTE — TOC Progression Note (Addendum)
Transition of Care [facility]) - Progression Note    Patient Details  Name: ZAKHAI JETTE MRN: [id number] Date of Birth: 1936/01/12  [facility] CM/SW Contact  Joanne Chars, LCSW Phone Number: 09/20/2020, 1:36 PM  Clinical Narrative:   CSW presented bed offers to pt wife.  She requested CSW follow up with SNF closer to her home in [place].  CSW called the following SNFs: [facility]: declined [facility]: no beds [facility]: no beds [facility]-did make bed offer  CSW presented updated list to wife and pt son, they would like to accept offer from [facility].  CSW LM with Lexine Baton at [facility], possible DC tomorrow.   1415: CSW spoke with [place] at [facility].  She will not know until tomorrow if she has a bed, please call and check back tomorrow.    Expected Discharge Plan: Skilled Nursing Facility Barriers to Discharge: SNF Pending bed offer  Expected Discharge Plan and Services Expected Discharge Plan: [place] In-house Referral: Clinical Social Work   Post Acute Care Choice: [place] Living arrangements for the past 2 months: Single Family Home                                       Social Determinants of Health (SDOH) Interventions    Readmission Risk Interventions No flowsheet data found.

## 2020-09-20 NOTE — Progress Notes (Signed)
Patient was transferred to 5N32 via his bed for an extended hospital stay; in no acute distress nor discomfort; patient was asleep the entire time he was transported; his wife and son came along with all his belongings; handoff report given to receiving RN.

## 2020-09-20 NOTE — Progress Notes (Signed)
  NEUROSURGERY PROGRESS NOTE   Was very somnolent this am, significantly improved after breathing treatment. Cont to c/o back soreness and subjective leg weakness. Had to have Foley for urinary retention yesterday  EXAM:  BP 117/72 (BP Location: Right Arm)   Pulse 86   Temp 98.6 F (37 C) (Oral)   Resp 18   Ht '5\' 9"'$  (1.753 m)   Wt 98.2 kg   SpO2 98%   BMI 31.96 kg/m   Awake, alert, oriented  Speech fluent, appropriate  CN grossly intact  5/5 BUE/BLE   IMPRESSION:  85 y.o. male POD#2 s/p L2-4 decompression, extension of fusion L2-L5. Recovering slowly.  PLAN: - Cont to mobilize with therapy - Cont Flomax, will d/c Foley tomorrow am for trial of void. - If able to urinate, would plan on transfer to SNF tomorrow  Consuella Lose, MD [facility]

## 2020-09-20 NOTE — Progress Notes (Signed)
Occupational Therapy Treatment Patient Details Name: Ricky Nelson MRN: [id number] DOB: 14-Aug-1935 Today's Date: 09/20/2020    History of present illness 85 y.o. male admitted to [facility] on 8/30 for L2-4 fusion with extension of hardware L2-L5. PMH significant for HTN, DM2, Bladder cancer, MI in 1995, Sleep apnea.   OT comments  Pt limited by lethargy and mild confusion this session. He is requiring mod verbal and tactile cuing for safety and participation. Pt requiring max A for bed mobility due to weakness and pain, as well as confusion. Pt requiring increased time with sit<>stand due to dizziness, before completing transfer. Additionally, pt had difficulty keeping his eyes open this session, reporting that he couldn't. Pt POC updated to SNF, due to safety concerns as well as increased weakness and balance concerns. OT will continue to follow acutely.   Follow Up Recommendations  SNF;Supervision/Assistance - 24 hour    Equipment Recommendations  Other (comment) (RW)    Recommendations for Other Services      Precautions / Restrictions Precautions Precautions: Back Precaution Booklet Issued: Yes (comment) Precaution Comments: Hand out provided and precautions reviewed Required Braces or Orthoses: Spinal Brace Spinal Brace: Lumbar corset;Applied in sitting position Restrictions Weight Bearing Restrictions: No       Mobility Bed Mobility Overal bed mobility: Needs Assistance Bed Mobility: Rolling;Sidelying to Sit Rolling: Mod assist Sidelying to sit: Max assist       General bed mobility comments: Assist to elevate trunk to full sitting position and bringing BLE off bed.    Transfers Overall transfer level: Needs assistance Equipment used: Rolling walker (2 wheeled) Transfers: Sit to/from Stand Sit to Stand: Min assist         General transfer comment: Assist from elevated bed height to power-up to full stand. VC's for hand placement on seated surface for safety.  Assist with RW when ambulating    Balance Overall balance assessment: Needs assistance Sitting-balance support: Feet supported;Bilateral upper extremity supported Sitting balance-Leahy Scale: Poor Sitting balance - Comments: Pt leaning to the R and requring mod-max support to maintain midline posture. Postural control: Right lateral lean Standing balance support: Bilateral upper extremity supported Standing balance-Leahy Scale: Poor Standing balance comment: Pt requires BUE support in standing for balance both statically and dynamically.                           ADL either performed or assessed with clinical judgement   ADL Overall ADL's : Needs assistance/impaired   Eating/Feeding Details (indicate cue type and reason): Pt's wife feeding him this session                     Toilet Transfer: Minimal assistance;Ambulation Toilet Transfer Details (indicate cue type and reason): Min A for moving RW as pt moves. Toileting- Clothing Manipulation and Hygiene: Moderate assistance;Sitting/lateral lean;Sit to/from stand Toileting - Clothing Manipulation Details (indicate cue type and reason): Limited by lethargy and decreased awareness     Functional mobility during ADLs: Minimal assistance;Rolling walker General ADL Comments: Pt requiring increased cueing this session due to lethargy/mild confusion. Difficulty manuevering RW     Vision   Vision Assessment?: No apparent visual deficits Additional Comments: Pt kept his eyes shut for most of the session, reporting he couldn't open his eyes   Perception     Praxis      Cognition Arousal/Alertness: Awake/alert Behavior During Therapy: WFL for tasks assessed/performed Overall Cognitive Status: Impaired/Different from baseline Area of  Impairment: Safety/judgement;Awareness;Following commands;Attention                   Current Attention Level: Selective   Following Commands: Follows one step commands  inconsistently Safety/Judgement: Decreased awareness of safety Awareness: Emergent   General Comments: Pt very lethargic this session, with difficulty participating in therapy. Requiring increased verbal and tactile cueing.        Exercises     Shoulder Instructions       General Comments VSS onRA    Pertinent Vitals/ Pain       Pain Assessment: Faces Faces Pain Scale: Hurts even more Pain Location: back and BLE Pain Descriptors / Indicators: Aching;Constant;Discomfort;Cramping  Home Living                                          Prior Functioning/Environment              Frequency  Min 2X/week        Progress Toward Goals  OT Goals(current goals can now be found in the care plan section)  Progress towards OT goals: OT to reassess next treatment  Acute Rehab OT Goals Patient Stated Goal: To go home OT Goal Formulation: With patient/family Time For Goal Achievement: 10/03/20 Potential to Achieve Goals: Good ADL Goals Pt Will Perform Lower Body Bathing: with modified independence;with adaptive equipment;sitting/lateral leans;sit to/from stand Pt Will Perform Lower Body Dressing: with modified independence;with adaptive equipment;sitting/lateral leans;sit to/from stand Pt Will Transfer to Toilet: with modified independence;ambulating Pt Will Perform Toileting - Clothing Manipulation and hygiene: with modified independence;with adaptive equipment;sitting/lateral leans;sit to/from stand  Plan Frequency remains appropriate;Discharge plan needs to be updated    Co-evaluation                 AM-PAC OT "6 Clicks" Daily Activity     Outcome Measure   Help from another person eating meals?: None Help from another person taking care of personal grooming?: A Little Help from another person toileting, which includes using toliet, bedpan, or urinal?: A Lot Help from another person bathing (including washing, rinsing, drying)?: A Lot Help  from another person to put on and taking off regular upper body clothing?: None Help from another person to put on and taking off regular lower body clothing?: A Lot 6 Click Score: 17    End of Session Equipment Utilized During Treatment: Rolling walker;Back brace  OT Visit Diagnosis: Unsteadiness on feet (R26.81);Other abnormalities of gait and mobility (R26.89);Muscle weakness (generalized) (M62.81);Pain Pain - Right/Left:  (Back) Pain - part of body:  (Back)   Activity Tolerance Patient limited by lethargy   Patient Left in chair;with call bell/phone within reach;with family/visitor present   Nurse Communication Mobility status        Time: [id number] OT Time Calculation (min): 25 min  Charges: OT General Charges $OT Visit: 1 Visit OT Treatments $Self Care/Home Management : 23-37 mins  Neveyah Garzon H., OTR/L Acute Rehabilitation  Deeann Servidio Elane Jazlynn Nemetz 09/20/2020, 10:05 AM

## 2020-09-21 LAB — GLUCOSE, CAPILLARY
Glucose-Capillary: 148 mg/dL — ABNORMAL HIGH (ref 70–99)
Glucose-Capillary: 142 mg/dL — ABNORMAL HIGH (ref 70–99)
Glucose-Capillary: 184 mg/dL — ABNORMAL HIGH (ref 70–99)
Glucose-Capillary: 161 mg/dL — ABNORMAL HIGH (ref 70–99)

## 2020-09-21 LAB — CBC
HCT: 19.8 % — ABNORMAL LOW (ref 39.0–52.0)
Hemoglobin: 6 g/dL — CL (ref 13.0–17.0)
MCH: 30.8 pg (ref 26.0–34.0)
MCHC: 30.3 g/dL (ref 30.0–36.0)
MCV: 101.5 fL — ABNORMAL HIGH (ref 80.0–100.0)
Platelets: 124 10*3/uL — ABNORMAL LOW (ref 150–400)
RBC: 1.95 MIL/uL — ABNORMAL LOW (ref 4.22–5.81)
RDW: 14.1 % (ref 11.5–15.5)
WBC: 7.7 10*3/uL (ref 4.0–10.5)
nRBC: 0.3 % — ABNORMAL HIGH (ref 0.0–0.2)

## 2020-09-21 LAB — COMPREHENSIVE METABOLIC PANEL
ALT: 10 U/L (ref 0–44)
AST: 57 U/L — ABNORMAL HIGH (ref 15–41)
Albumin: 2.6 g/dL — ABNORMAL LOW (ref 3.5–5.0)
Alkaline Phosphatase: 51 U/L (ref 38–126)
Anion gap: 7 (ref 5–15)
BUN: 36 mg/dL — ABNORMAL HIGH (ref 8–23)
CO2: 23 mmol/L (ref 22–32)
Calcium: 8.1 mg/dL — ABNORMAL LOW (ref 8.9–10.3)
Chloride: 102 mmol/L (ref 98–111)
Creatinine, Ser: 1.82 mg/dL — ABNORMAL HIGH (ref 0.61–1.24)
GFR, Estimated: 36 mL/min — ABNORMAL LOW (ref >60)
Glucose, Bld: 156 mg/dL — ABNORMAL HIGH (ref 70–99)
Potassium: 4.6 mmol/L (ref 3.5–5.1)
Sodium: 132 mmol/L — ABNORMAL LOW (ref 135–145)
Total Bilirubin: 0.7 mg/dL (ref 0.3–1.2)
Total Protein: 6 g/dL — ABNORMAL LOW (ref 6.5–8.1)

## 2020-09-21 LAB — PREPARE RBC (CROSSMATCH)

## 2020-09-21 MED ORDER — SODIUM CHLORIDE 0.9% IV SOLUTION: Freq: Once | INTRAVENOUS | Status: AC

## 2020-09-21 NOTE — Care Management Important Message (Signed)
Important Message  Patient Details  Name: Ricky Nelson MRN: [id number] Date of Birth: Jul 06, 1935   Medicare Important Message Given:  Yes     Miu Chiong 09/21/2020, 3:15 PM

## 2020-09-21 NOTE — Plan of Care (Signed)
  Problem: Education: Goal: Ability to verbalize activity precautions or restrictions will improve Outcome: Progressing   Problem: Activity: Goal: Ability to avoid complications of mobility impairment will improve Outcome: Progressing   Problem: Pain Management: Goal: Pain level will decrease Outcome: Progressing   Problem: Skin Integrity: Goal: Will show signs of wound healing Outcome: Progressing   Problem: Bladder/Genitourinary: Goal: Urinary functional status for postoperative course will improve Outcome: Progressing

## 2020-09-21 NOTE — Progress Notes (Signed)
Pt placed on CPAP at previous settings and 2 lt bleed in FI02

## 2020-09-21 NOTE — Progress Notes (Signed)
Physical Therapy Treatment Patient Details Name: Ricky Nelson MRN: [id number] DOB: 1935-03-12 Today's Date: 09/21/2020    History of Present Illness Pt is an 85 y/o male admitted to [facility] on 8/30 for L2-4 fusion with extension of hardware L2-L5. PMH significant for HTN, DM2, Bladder cancer, MI in 1995, Sleep apnea.    PT Comments    Pt progressing well with post-op mobility. He was able to demonstrate transfers with up to +2 mod assist. Pt continues to be lethargic. Had difficulty maintaining eyes open throughout activity however seemed fairly alert at end of session with lights on, a program on TV he was interested in, and family present. Encouraged incentive spirometer and instructed pt/family in correct use. Feel pt could benefit from several short bouts of mobility. Encourage BSC use with staff when able. Will continue to follow.      Follow Up Recommendations  SNF;Supervision/Assistance - 24 hour     Equipment Recommendations  None recommended by PT (TBD by next venue of care)    Recommendations for Other Services       Precautions / Restrictions Precautions Precautions: Back Precaution Booklet Issued: Yes (comment) Precaution Comments: Hand out provided and precautions reviewed Required Braces or Orthoses: Spinal Brace Spinal Brace: Lumbar corset;Applied in sitting position Restrictions Weight Bearing Restrictions: No    Mobility  Bed Mobility Overal bed mobility: Needs Assistance Bed Mobility: Rolling;Sidelying to Sit Rolling: Mod assist Sidelying to sit: Max assist       General bed mobility comments: Assist for LE elevation back up into bed at end of session.    Transfers Overall transfer level: Needs assistance Equipment used: Rolling walker (2 wheeled) Transfers: Sit to/from Stand Sit to Stand: +2 physical assistance;Min assist         General transfer comment: VC's for hand placement on seated surface for safety. +2 mod assist to power-up to full  stand from recliner. Min assist for controlled lower down to EOB at end of session.  Ambulation/Gait Ambulation/Gait assistance: Min assist;+2 physical assistance;+2 safety/equipment Gait Distance (Feet): 3 Feet Assistive device: Rolling walker (2 wheeled) Gait Pattern/deviations: Step-to pattern;Decreased stride length;Trunk flexed Gait velocity: Decreased Gait velocity interpretation: <1.31 ft/sec, indicative of household ambulator General Gait Details: Occasional assist for walker management. 3/4 DOE by the end of gait training.   Stairs             Wheelchair Mobility    Modified Rankin (Stroke Patients Only)       Balance Overall balance assessment: Needs assistance Sitting-balance support: Feet supported;Bilateral upper extremity supported Sitting balance-Leahy Scale: Poor   Postural control: Right lateral lean;Posterior lean Standing balance support: Bilateral upper extremity supported Standing balance-Leahy Scale: Poor Standing balance comment: Pt requires BUE support in standing for balance both statically and dynamically.                            Cognition Arousal/Alertness: Awake/alert Behavior During Therapy: WFL for tasks assessed/performed Overall Cognitive Status: Impaired/Different from baseline Area of Impairment: Safety/judgement;Awareness;Following commands;Attention                   Current Attention Level: Selective   Following Commands: Follows one step commands inconsistently Safety/Judgement: Decreased awareness of safety;Decreased awareness of deficits Awareness: Emergent   General Comments: Requires increased cues for 1-step commands this session. Appears confused at times, difficulty keeping eyes open.      Exercises      General Comments  Pertinent Vitals/Pain Pain Assessment: Faces Faces Pain Scale: Hurts little more Pain Location: back and BLE Pain Descriptors / Indicators:  Aching;Constant;Discomfort;Cramping Pain Intervention(s): Limited activity within patient's tolerance;Monitored during session;Repositioned    Home Living                      Prior Function            PT Goals (current goals can now be found in the care plan section) Acute Rehab PT Goals Patient Stated Goal: To go home PT Goal Formulation: With patient/family Time For Goal Achievement: 10/03/20 Potential to Achieve Goals: Good Progress towards PT goals: Not progressing toward goals - comment    Frequency    Min 5X/week      PT Plan Current plan remains appropriate    Co-evaluation              AM-PAC PT "6 Clicks" Mobility   Outcome Measure  Help needed turning from your back to your side while in a flat bed without using bedrails?: A Little Help needed moving from lying on your back to sitting on the side of a flat bed without using bedrails?: A Lot Help needed moving to and from a bed to a chair (including a wheelchair)?: A Little Help needed standing up from a chair using your arms (e.g., wheelchair or bedside chair)?: Total Help needed to walk in hospital room?: A Little Help needed climbing 3-5 steps with a railing? : Total 6 Click Score: 13    End of Session Equipment Utilized During Treatment: Gait belt;Back brace Activity Tolerance: Patient tolerated treatment well Patient left: in chair;with call bell/phone within reach;with chair alarm set;with family/visitor present Nurse Communication: Mobility status PT Visit Diagnosis: Unsteadiness on feet (R26.81);Pain Pain - part of body:  (back)     Time: [id number] PT Time Calculation (min) (ACUTE ONLY): 37 min  Charges:  $Gait Training: 23-37 mins                     Ricky Nelson, PT, DPT Acute Rehabilitation Services Pager: [phone] Office: [phone]    Ricky Nelson 09/21/2020, 2:33 PM

## 2020-09-21 NOTE — Progress Notes (Signed)
RN placed call to Dr. Cleotilde Neer office at 1223 in an effort to inform Dr. Ladona Horns of pt's critical lab levels (hgb 6.2). A message and call back number detailing patient's status was left with the secretary on shift. An order was placed for a type and screen and blood infusion at 1445 by Dr. Ralene Ok. Call received at 1453 stating that the blood was ready to be collected.

## 2020-09-21 NOTE — Progress Notes (Signed)
  NEUROSURGERY PROGRESS NOTE   Pt reports "not feeling well." Had episode of somnolence again yesterday evening.   EXAM:  BP (!) 147/77 (BP Location: Left Arm)   Pulse 86   Temp 98.1 F (36.7 C) (Oral)   Resp 19   Ht '5\' 9"'$  (1.753 m)   Wt 98.2 kg   SpO2 98%   BMI 31.96 kg/m   Drowsy but arouses easily Speech fluent  CN grossly intact  Good strength BLE   IMPRESSION:  85 y.o. male POD# 3 s/p L2-L4 decompression/fusion, extension L2-L5. Pt looks generally unwell  PLAN: - Will check CBC/CMP - d/c Foley for trial of void - Cont to work with PT/OT - Will need SNF placement but not stable for d/c yet.   Consuella Lose, MD [facility]

## 2020-09-22 LAB — BASIC METABOLIC PANEL
Anion gap: 7 (ref 5–15)
BUN: 33 mg/dL — ABNORMAL HIGH (ref 8–23)
CO2: 25 mmol/L (ref 22–32)
Calcium: 8.6 mg/dL — ABNORMAL LOW (ref 8.9–10.3)
Chloride: 100 mmol/L (ref 98–111)
Creatinine, Ser: 1.58 mg/dL — ABNORMAL HIGH (ref 0.61–1.24)
GFR, Estimated: 43 mL/min — ABNORMAL LOW (ref >60)
Glucose, Bld: 123 mg/dL — ABNORMAL HIGH (ref 70–99)
Potassium: 4.6 mmol/L (ref 3.5–5.1)
Sodium: 132 mmol/L — ABNORMAL LOW (ref 135–145)

## 2020-09-22 LAB — GLUCOSE, CAPILLARY
Glucose-Capillary: 119 mg/dL — ABNORMAL HIGH (ref 70–99)
Glucose-Capillary: 182 mg/dL — ABNORMAL HIGH (ref 70–99)
Glucose-Capillary: 120 mg/dL — ABNORMAL HIGH (ref 70–99)
Glucose-Capillary: 234 mg/dL — ABNORMAL HIGH (ref 70–99)

## 2020-09-22 LAB — CBC
HCT: 27.5 % — ABNORMAL LOW (ref 39.0–52.0)
Hemoglobin: 8.7 g/dL — ABNORMAL LOW (ref 13.0–17.0)
MCH: 30.1 pg (ref 26.0–34.0)
MCHC: 31.6 g/dL (ref 30.0–36.0)
MCV: 95.2 fL (ref 80.0–100.0)
Platelets: 147 10*3/uL — ABNORMAL LOW (ref 150–400)
RBC: 2.89 MIL/uL — ABNORMAL LOW (ref 4.22–5.81)
RDW: 15.4 % (ref 11.5–15.5)
WBC: 8.3 10*3/uL (ref 4.0–10.5)
nRBC: 0.4 % — ABNORMAL HIGH (ref 0.0–0.2)

## 2020-09-22 MED ORDER — IPRATROPIUM-ALBUTEROL 0.5-2.5 (3) MG/3ML IN SOLN: 3.0000 mL | Freq: Four times a day (QID) | RESPIRATORY_TRACT | Status: DC | PRN

## 2020-09-22 NOTE — Plan of Care (Signed)
  Problem: Pain Management: Goal: Pain level will decrease Outcome: Progressing   Problem: Skin Integrity: Goal: Will show signs of wound healing Outcome: Progressing   Problem: Health Behavior/Discharge Planning: Goal: Identification of resources available to assist in meeting health care needs will improve Outcome: Progressing   Problem: Bladder/Genitourinary: Goal: Urinary functional status for postoperative course will improve Outcome: Progressing

## 2020-09-22 NOTE — Progress Notes (Signed)
Physical Therapy Treatment Patient Details Name: Ricky Nelson MRN: [id number] DOB: 1935-12-26 Today's Date: 09/22/2020    History of Present Illness Pt is an 85 y/o male admitted to [facility] on 8/30 for L2-4 fusion with extension of hardware L2-L5. PMH significant for HTN, DM2, Bladder cancer, MI in 1995, Sleep apnea.    PT Comments    Patient is progressing well towards physical therapy goals, ambulating up to 75 feet today with min assist on 4L supplemental O2 and moderate+ dyspnea however SpO2 96%. Reviewed bed mobility and back precautions which requires cues intermittently throughout session to recall. Wife present and supportive, helping to remind pt of safety. Patient will continue to benefit from skilled physical therapy services to further improve independence with functional mobility.     Follow Up Recommendations  SNF;Supervision/Assistance - 24 hour     Equipment Recommendations  None recommended by PT (TBD by next venue of care)    Recommendations for Other Services       Precautions / Restrictions Precautions Precautions: Back Precaution Booklet Issued: Yes (comment) Precaution Comments: precautions reviewed Required Braces or Orthoses: Spinal Brace Spinal Brace: Lumbar corset;Applied in sitting position Restrictions Weight Bearing Restrictions: No    Mobility  Bed Mobility Overal bed mobility: Needs Assistance Bed Mobility: Rolling;Sidelying to Sit Rolling: Min assist Sidelying to sit: Mod assist       General bed mobility comments: Educated on log roll, rolling towards left side, using UEs on rail, cues for technique. Min assist to roll and max VC to bring LEs out of bed with some assist. Mod assist for trunk support to rise to EOB.    Transfers Overall transfer level: Needs assistance Equipment used: Rolling walker (2 wheeled) Transfers: Sit to/from Stand Sit to Stand: Min assist         General transfer comment: Min assist for boost, cues for hand  placement and back precautions when rising and sitting into recliner to prevent twisting and acknowledge precautions. Able to adjust brace comfortably with assist while standing, but donned in sitting.  Ambulation/Gait Ambulation/Gait assistance: Min assist Gait Distance (Feet): 75 Feet Assistive device: Rolling walker (2 wheeled) Gait Pattern/deviations: Step-to pattern;Decreased stride length;Trunk flexed Gait velocity: Decreased   General Gait Details: Min assist for RW direction, cues for proximity to RW for safety. 3/4 dyspnea at end of distance, cues for pursed lip breathing, while on 4L supplemental O2 SpO2 maintained 96%. No overt LOB, easily distracted but appropriate. Required a couple of short standing rest breaks to complete distance.   Stairs             Wheelchair Mobility    Modified Rankin (Stroke Patients Only)       Balance Overall balance assessment: Needs assistance Sitting-balance support: Feet supported;No upper extremity supported Sitting balance-Leahy Scale: Fair Sitting balance - Comments: Pt leaning to the R and requring mod-max support to maintain midline posture.   Standing balance support: Bilateral upper extremity supported Standing balance-Leahy Scale: Poor Standing balance comment: Pt requires BUE support in standing for balance both statically and dynamically.                            Cognition Arousal/Alertness: Awake/alert Behavior During Therapy: WFL for tasks assessed/performed Overall Cognitive Status: Impaired/Different from baseline Area of Impairment: Safety/judgement;Awareness;Following commands;Attention                   Current Attention Level: Selective   Following Commands:  Follows one step commands consistently;Follows one step commands with increased time;Follows multi-step commands inconsistently Safety/Judgement: Decreased awareness of safety;Decreased awareness of deficits Awareness: Emergent    General Comments: Requires increased cues. Wife says much more alert today.      Exercises      General Comments General comments (skin integrity, edema, etc.): Pt on 4L when PT entered room with sats at 96%.      Pertinent Vitals/Pain Pain Assessment: 0-10 Pain Score: 5  Pain Location: back Pain Descriptors / Indicators: Aching;Constant;Discomfort;Cramping Pain Intervention(s): Limited activity within patient's tolerance;Monitored during session;Repositioned    Home Living                      Prior Function            PT Goals (current goals can now be found in the care plan section) Acute Rehab PT Goals Patient Stated Goal: To go home PT Goal Formulation: With patient/family Time For Goal Achievement: 10/03/20 Potential to Achieve Goals: Good Progress towards PT goals: Progressing toward goals    Frequency    Min 5X/week      PT Plan Current plan remains appropriate    Co-evaluation              AM-PAC PT "6 Clicks" Mobility   Outcome Measure  Help needed turning from your back to your side while in a flat bed without using bedrails?: A Little Help needed moving from lying on your back to sitting on the side of a flat bed without using bedrails?: A Lot Help needed moving to and from a bed to a chair (including a wheelchair)?: A Little Help needed standing up from a chair using your arms (e.g., wheelchair or bedside chair)?: A Little Help needed to walk in hospital room?: A Little Help needed climbing 3-5 steps with a railing? : Total 6 Click Score: 15    End of Session Equipment Utilized During Treatment: Gait belt;Back brace Activity Tolerance: Patient tolerated treatment well Patient left: in chair;with call bell/phone within reach;with chair alarm set;with family/visitor present Nurse Communication: Mobility status PT Visit Diagnosis: Unsteadiness on feet (R26.81);Pain Pain - part of body:  (back)     Time: [id number] PT Time  Calculation (min) (ACUTE ONLY): 31 min  Charges:  $Gait Training: 8-22 mins $Therapeutic Activity: 8-22 mins                     Candie Mile, PT, DPT   Ellouise Newer 09/22/2020, 2:53 PM

## 2020-09-22 NOTE — Progress Notes (Signed)
Subjective: The patient is alert and pleasant.  He is in no apparent distress.  He has no complaints.  His son is at the bedside.  His Foley catheter was replaced last night.  Objective: Vital signs in last 24 hours: Temp:  [97.9 F (36.6 C)-98.6 F (37 C)] 98.1 F (36.7 C) (09/03 0519) Pulse Rate:  [77-92] 92 (09/03 0519) Resp:  [14-19] 15 (09/03 0519) BP: (106-163)/(58-96) 155/85 (09/03 0600) SpO2:  [93 %-99 %] 93 % (09/03 0519) Estimated body mass index is 31.96 kg/m as calculated from the following:   Height as of this encounter: '5\' 9"'$  (1.753 m).   Weight as of this encounter: 98.2 kg.   Intake/Output from previous day: 09/02 0701 - 09/03 0700 In: 630 [Blood:630] Out: 1750 [Urine:1750] Intake/Output this shift: No intake/output data recorded.  Physical exam patient is alert and pleasant.  He is moving his lower extremities well.  Lab Results: Recent Labs    09/21/20 1120 09/22/20 0726  WBC 7.7 8.3  HGB 6.0* 8.7*  HCT 19.8* 27.5*  PLT 124* 147*   BMET Recent Labs    09/21/20 1120 09/22/20 0726  NA 132* 132*  K 4.6 4.6  CL 102 100  CO2 23 25  GLUCOSE 156* 123*  BUN 36* 33*  CREATININE 1.82* 1.58*  CALCIUM 8.1* 8.6*    Studies/Results: No results found.  Assessment/Plan: Postop day #4: We are awaiting skilled nursing facility placement.  Acute blood loss anemia: The patient's posttransfusion hemoglobin is 8.7.  LOS: 4 days     Ophelia Charter 09/22/2020, 10:24 AM     Patient ID: Ricky Nelson, male   DOB: 28-May-1935, 85 y.o.   MRN: [id number]

## 2020-09-23 LAB — TYPE AND SCREEN
ABO/RH(D): A POS
Antibody Screen: NEGATIVE
Unit division: 0
Unit division: 0

## 2020-09-23 LAB — BASIC METABOLIC PANEL
Anion gap: 9 (ref 5–15)
BUN: 33 mg/dL — ABNORMAL HIGH (ref 8–23)
CO2: 22 mmol/L (ref 22–32)
Calcium: 8.5 mg/dL — ABNORMAL LOW (ref 8.9–10.3)
Chloride: 99 mmol/L (ref 98–111)
Creatinine, Ser: 1.65 mg/dL — ABNORMAL HIGH (ref 0.61–1.24)
GFR, Estimated: 41 mL/min — ABNORMAL LOW (ref >60)
Glucose, Bld: 154 mg/dL — ABNORMAL HIGH (ref 70–99)
Potassium: 4.8 mmol/L (ref 3.5–5.1)
Sodium: 130 mmol/L — ABNORMAL LOW (ref 135–145)

## 2020-09-23 LAB — GLUCOSE, CAPILLARY
Glucose-Capillary: 133 mg/dL — ABNORMAL HIGH (ref 70–99)
Glucose-Capillary: 177 mg/dL — ABNORMAL HIGH (ref 70–99)
Glucose-Capillary: 160 mg/dL — ABNORMAL HIGH (ref 70–99)
Glucose-Capillary: 185 mg/dL — ABNORMAL HIGH (ref 70–99)

## 2020-09-23 LAB — CBC
HCT: 27.1 % — ABNORMAL LOW (ref 39.0–52.0)
Hemoglobin: 8.9 g/dL — ABNORMAL LOW (ref 13.0–17.0)
MCH: 30.8 pg (ref 26.0–34.0)
MCHC: 32.8 g/dL (ref 30.0–36.0)
MCV: 93.8 fL (ref 80.0–100.0)
Platelets: 158 10*3/uL (ref 150–400)
RBC: 2.89 MIL/uL — ABNORMAL LOW (ref 4.22–5.81)
RDW: 15.2 % (ref 11.5–15.5)
WBC: 7.4 10*3/uL (ref 4.0–10.5)
nRBC: 0.4 % — ABNORMAL HIGH (ref 0.0–0.2)

## 2020-09-23 LAB — BPAM RBC
Blood Product Expiration Date: 202210012359
Blood Product Expiration Date: 202210012359
ISSUE DATE / TIME: 202209021523
ISSUE DATE / TIME: 202209030135
Unit Type and Rh: 6200
Unit Type and Rh: 6200

## 2020-09-23 NOTE — Progress Notes (Addendum)
Pt. O2 dropped into the 80's while doing hygiene. O2 via nasal cannula at 4L was placed on pt. O2 levels returned to 100%. O2 was tapered down to 2L. Will continue to monitor pt.   1845: Pt. O2 was bumped up to 3L after transferring from the chair to the bed. Pt. is not symptomatic. Will continue to monitor.

## 2020-09-23 NOTE — Progress Notes (Signed)
Pt placed on CPAP at previous settings and a 4 lt bleed in FI02.

## 2020-09-23 NOTE — Progress Notes (Signed)
Subjective: Patient reports that he is doing well. Patient's wife and RN are at bedside. No acute events overnight.   Objective: Vital signs in last 24 hours: Temp:  [98.1 F (36.7 C)-98.5 F (36.9 C)] 98.5 F (36.9 C) (09/03 2038) Pulse Rate:  [83-95] 94 (09/03 2038) Resp:  [16-18] 18 (09/03 2038) BP: (132-160)/(77-91) 160/91 (09/03 2038) SpO2:  [91 %-97 %] 95 % (09/03 2038)  Intake/Output from previous day: 09/03 0701 - 09/04 0700 In: -  Out: 2100 [Urine:2100] Intake/Output this shift: No intake/output data recorded.  Physical Exam: Patient drowsy, O X 4, conversant, and in good spirits. They are in NAD and VSS. Doing well. Speech is fluent and appropriate. MAEW. PERLA, EOMI. CNs grossly intact. Dressing is clean dry intact.  Lab Results: Recent Labs    09/22/20 0726 09/23/20 0057  WBC 8.3 7.4  HGB 8.7* 8.9*  HCT 27.5* 27.1*  PLT 147* 158   BMET Recent Labs    09/22/20 0726 09/23/20 0057  NA 132* 130*  K 4.6 4.8  CL 100 99  CO2 25 22  GLUCOSE 123* 154*  BUN 33* 33*  CREATININE 1.58* 1.65*  CALCIUM 8.6* 8.5*    Studies/Results: No results found.  Assessment/Plan: Patient is post-op day 5 s/p L2-L4 decompression/fusion, extension L2-L5. Foley has been removed. Condom cath in place. Continue working on pain control, mobility and ambulating patient. Continue supportive care. Awaiting skilled nursing facility placement.  Acute blood loss anemia: The patient's hemoglobin this morning is 8.9.   LOS: 5 days      Marvis Moeller, DNP, NP-C 09/23/2020, 9:38 AM

## 2020-09-24 LAB — CBC
HCT: 29.3 % — ABNORMAL LOW (ref 39.0–52.0)
Hemoglobin: 9.5 g/dL — ABNORMAL LOW (ref 13.0–17.0)
MCH: 30.6 pg (ref 26.0–34.0)
MCHC: 32.4 g/dL (ref 30.0–36.0)
MCV: 94.5 fL (ref 80.0–100.0)
Platelets: 189 10*3/uL (ref 150–400)
RBC: 3.1 MIL/uL — ABNORMAL LOW (ref 4.22–5.81)
RDW: 15 % (ref 11.5–15.5)
WBC: 6.1 10*3/uL (ref 4.0–10.5)
nRBC: 0.3 % — ABNORMAL HIGH (ref 0.0–0.2)

## 2020-09-24 LAB — GLUCOSE, CAPILLARY
Glucose-Capillary: 83 mg/dL (ref 70–99)
Glucose-Capillary: 238 mg/dL — ABNORMAL HIGH (ref 70–99)
Glucose-Capillary: 176 mg/dL — ABNORMAL HIGH (ref 70–99)
Glucose-Capillary: 176 mg/dL — ABNORMAL HIGH (ref 70–99)

## 2020-09-24 LAB — BASIC METABOLIC PANEL
Anion gap: 8 (ref 5–15)
BUN: 33 mg/dL — ABNORMAL HIGH (ref 8–23)
CO2: 26 mmol/L (ref 22–32)
Calcium: 8.9 mg/dL (ref 8.9–10.3)
Chloride: 97 mmol/L — ABNORMAL LOW (ref 98–111)
Creatinine, Ser: 1.79 mg/dL — ABNORMAL HIGH (ref 0.61–1.24)
GFR, Estimated: 37 mL/min — ABNORMAL LOW (ref >60)
Glucose, Bld: 99 mg/dL (ref 70–99)
Potassium: 4 mmol/L (ref 3.5–5.1)
Sodium: 131 mmol/L — ABNORMAL LOW (ref 135–145)

## 2020-09-24 MED ORDER — POLYETHYLENE GLYCOL 3350 17 G PO PACK
17.0000 g | PACK | Freq: Every day | ORAL | Status: DC
  Administered 2020-09-25 – 2020-10-11 (×13): 17 g via ORAL
  Filled 2020-09-24 (×13): qty 1

## 2020-09-24 MED ORDER — DOCUSATE SODIUM 100 MG PO CAPS
100.0000 mg | ORAL_CAPSULE | Freq: Two times a day (BID) | ORAL | Status: DC
  Administered 2020-09-24 – 2020-10-11 (×29): 100 mg via ORAL
  Filled 2020-09-24 (×30): qty 1

## 2020-09-24 NOTE — Progress Notes (Signed)
Subjective: Patient reports that he is doing well overall. He has complaints of constipation. No acute events overnight.  Objective: Vital signs in last 24 hours: Temp:  [97.4 F (36.3 C)-98.6 F (37 C)] 97.4 F (36.3 C) (09/05 0753) Pulse Rate:  [79-82] 79 (09/05 0753) Resp:  [15-17] 17 (09/05 0753) BP: (95-174)/(62-92) 174/92 (09/05 0753) SpO2:  [96 %-99 %] 98 % (09/05 0753)  Intake/Output from previous day: 09/04 0701 - 09/05 0700 In: -  Out: 3400 [Urine:3400] Intake/Output this shift: No intake/output data recorded.  Physical Exam: Patient drowsy, O X 4, conversant, and in good spirits. They are in NAD and VSS. Doing well. Speech is fluent and appropriate. MAEW. PERLA, EOMI. CNs grossly intact. Dressing is clean dry intact.  Lab Results: Recent Labs    09/23/20 0057 09/24/20 0450  WBC 7.4 6.1  HGB 8.9* 9.5*  HCT 27.1* 29.3*  PLT 158 189   BMET Recent Labs    09/23/20 0057 09/24/20 0450  NA 130* 131*  K 4.8 4.0  CL 99 97*  CO2 22 26  GLUCOSE 154* 99  BUN 33* 33*  CREATININE 1.65* 1.79*  CALCIUM 8.5* 8.9    Studies/Results: No results found.  Assessment/Plan: Patient is post-op day 6 s/p L2-L4 decompression/fusion, extension L2-L5. Continue working on pain control, mobility and ambulating patient. Continue supportive care. Awaiting skilled nursing facility placement. Utilize PRN medications for constipation. Will add scheduled colace and Mirilax.   Acute blood loss anemia: The patient's hemoglobin this morning is 9.5    LOS: 6 days     Marvis Moeller, DNP, NP-C 09/24/2020, 10:05 AM

## 2020-09-24 NOTE — Progress Notes (Signed)
Occupational Therapy Treatment Patient Details Name: Ricky Nelson MRN: [id number] DOB: March 21, 1935 Today's Date: 09/24/2020    History of present illness 85 y/o male admitted to [facility] on 8/30 for L2-4 fusion with extension of hardware L2-L5. PMH significant for HTN, DM2, Bladder cancer, MI in 1995, Sleep apnea.   OT comments  Pt presents with progress towards goals this session. Pt completed sit<>stand from recliner with Min guard and ambulated to bathroom. Pt completed oral care in standing and educated on compensatory strategies to adhere to spinal precautions during. Pt verbalized demonstrated understanding of education, additional reinforcement needed for carryover of precautions. Continue to recommend SNF rehab to increase safety with precautions and decrease burden of care post-acute stay. Will continue to follow acutely to address occupational performance.    Follow Up Recommendations  SNF;Supervision/Assistance - 24 hour    Equipment Recommendations       Recommendations for Other Services      Precautions / Restrictions Precautions Precautions: Back Precaution Booklet Issued: Yes (comment) Required Braces or Orthoses: Spinal Brace Spinal Brace: Lumbar corset;Applied in sitting position Restrictions Weight Bearing Restrictions: No       Mobility Bed Mobility                    Transfers Overall transfer level: Needs assistance Equipment used: Rolling walker (2 wheeled) Transfers: Sit to/from Stand Sit to Stand: Min guard         General transfer comment: For safety during sit<>stand    Balance Overall balance assessment: Needs assistance Sitting-balance support: Bilateral upper extremity supported Sitting balance-Leahy Scale: Good Sitting balance - Comments: Pt in chair with armrests.   Standing balance support: Bilateral upper extremity supported Standing balance-Leahy Scale: Fair Standing balance comment: Pt with fair static/dynamic standing  balance evident by no LOBs and steadiness with RW during oral care.                           ADL either performed or assessed with clinical judgement   ADL Overall ADL's : Needs assistance/impaired     Grooming: Supervision/safety;Standing;Oral care Grooming Details (indicate cue type and reason): Pt completed oral care standing at sink with RW, required supervision for safety during activity. Pt educated on safe oral care while adhering to precautions using a cup to rinse and spit to avoid bending                 Toilet Transfer: Min Fish farm manager Details (indicate cue type and reason): Pt required min guard for safety during sit<> stand from chair. (simulated with chair)         Functional mobility during ADLs: Min guard;Rolling walker General ADL Comments: Pt required min verbal cues to recall task that needed to be completed and with decreased functional strength and limited recall of precautions limiting ability to complete self care safely.     Vision   Vision Assessment?: Yes (wears glasses)   Perception     Praxis      Cognition Arousal/Alertness: Awake/alert Behavior During Therapy: WFL for tasks assessed/performed Overall Cognitive Status: Impaired/Different from baseline Area of Impairment: Memory;Following commands;Safety/judgement;Awareness;Attention                   Current Attention Level: Sustained Memory: Decreased recall of precautions;Decreased short-term memory Following Commands: Follows one step commands with increased time Safety/Judgement: Decreased awareness of safety;Decreased awareness of deficits Awareness: Emergent   General Comments: Requires increased time  for command following and required mod verbal cues to recall 3/3 precuations. Wife reports cognition is worse since being at hospital.        Exercises     Shoulder Instructions       General Comments Wife Peter Congo) present throughout     Pertinent Vitals/ Pain       Pain Assessment: Faces Faces Pain Scale: Hurts a little bit Pain Location: back Pain Descriptors / Indicators: Aching;Discomfort Pain Intervention(s): Monitored during session (Pt instructed to donn brace and continue wearing while sitting, educated on wearing during sitting to reduce pain)  Home Living Family/patient expects to be discharged to:: Skilled nursing facility Living Arrangements: Spouse/significant other Available Help at Discharge: Family;Available 24 hours/day Type of Home: House Home Access: Stairs to enter [employer] of Steps: 4 steps Entrance Stairs-Rails: Can reach both Home Layout: Two level;Able to live on main level with bedroom/bathroom     Bathroom Shower/Tub: Occupational psychologist: Standard Bathroom Accessibility: Yes How Accessible: Accessible via walker Home Equipment: [place] in          Prior Functioning/Environment Level of Independence: Independent            Frequency  Min 2X/week        Progress Toward Goals  OT Goals(current goals can now be found in the care plan section)  Progress towards OT goals: Progressing toward goals  Acute Rehab OT Goals Patient Stated Goal: To go home OT Goal Formulation: With patient/family Time For Goal Achievement: 10/03/20 Potential to Achieve Goals: Good ADL Goals Pt Will Perform Lower Body Bathing: with modified independence;with adaptive equipment;sitting/lateral leans;sit to/from stand Pt Will Perform Lower Body Dressing: with modified independence;with adaptive equipment;sitting/lateral leans;sit to/from stand Pt Will Transfer to Toilet: with modified independence;ambulating Pt Will Perform Toileting - Clothing Manipulation and hygiene: with modified independence;with adaptive equipment;sitting/lateral leans;sit to/from stand  Plan      Co-evaluation                 AM-PAC OT "6 Clicks" Daily Activity      Outcome Measure   Help from another person eating meals?: None Help from another person taking care of personal grooming?: A Little Help from another person toileting, which includes using toliet, bedpan, or urinal?: A Little Help from another person bathing (including washing, rinsing, drying)?: A Lot Help from another person to put on and taking off regular upper body clothing?: A Little Help from another person to put on and taking off regular lower body clothing?: A Lot 6 Click Score: 17    End of Session Equipment Utilized During Treatment: Rolling walker;Back brace  OT Visit Diagnosis: Unsteadiness on feet (R26.81);Muscle weakness (generalized) (M62.81);Other abnormalities of gait and mobility (R26.89);Pain Pain - part of body:  (Back)   Activity Tolerance Patient tolerated treatment well   Patient Left in chair;with call bell/phone within reach;with family/visitor present   Nurse Communication Mobility status        Time: [id number] OT Time Calculation (min): 26 min  Charges: OT General Charges $OT Visit: 1 Visit OT Treatments $Self Care/Home Management : 23-37 mins  Jackquline Denmark, OTS Acute Rehab Office: [phone]    Katia Hannen 09/24/2020, 12:46 PM

## 2020-09-24 NOTE — Plan of Care (Signed)
  Problem: Activity: Goal: Ability to avoid complications of mobility impairment will improve Outcome: Progressing Goal: Ability to tolerate increased activity will improve Outcome: Progressing Goal: Will remain free from falls Outcome: Progressing   Problem: Clinical Measurements: Goal: Postoperative complications will be avoided or minimized Outcome: Progressing   Problem: Pain Management: Goal: Pain level will decrease Outcome: Progressing   Problem: Skin Integrity: Goal: Will show signs of wound healing Outcome: Progressing   

## 2020-09-24 NOTE — Progress Notes (Signed)
Pt placed on CPAP via FFM on auto auto 18 cmH2O max- 5 cmH2O min with 4 L O2 bleed in.  Patient tolerating at this time.

## 2020-09-24 NOTE — Progress Notes (Signed)
Physical Therapy Treatment Patient Details Name: Ricky Nelson MRN: [id number] DOB: 1935/12/12 Today's Date: 09/24/2020    History of Present Illness Pt is an 85 y/o male admitted to [facility] on 8/30 for L2-4 fusion with extension of hardware L2-L5. PMH significant for HTN, DM2, Bladder cancer, MI in 1995, Sleep apnea.    PT Comments    Pt progressing well with post-op mobility. He was able to demonstrate transfers and ambulation with up to mod assist but progressed ambulation distance well today. O2 was 100% on 3L/min supplemental O2 after ambulation. Pt was educated on precautions, brace application/wearing schedule, appropriate activity progression. Will continue to follow.      Follow Up Recommendations  SNF;Supervision/Assistance - 24 hour     Equipment Recommendations  None recommended by PT (TBD by next venue of care)    Recommendations for Other Services       Precautions / Restrictions Precautions Precautions: Back Precaution Booklet Issued: Yes (comment) Precaution Comments: Reviewed precautions verbally during functional mobility. Required Braces or Orthoses: Spinal Brace Spinal Brace: Lumbar corset;Applied in sitting position Restrictions Weight Bearing Restrictions: No    Mobility  Bed Mobility Overal bed mobility: Needs Assistance Bed Mobility: Rolling;Sidelying to Sit Rolling: Min assist Sidelying to sit: Mod assist       General bed mobility comments: VC's for optimal log roll technique.    Transfers Overall transfer level: Needs assistance Equipment used: Rolling walker (2 wheeled) Transfers: Sit to/from Stand Sit to Stand: Min guard         General transfer comment: VC's for hand placement on seated surface for safety.  Ambulation/Gait Ambulation/Gait assistance: Min guard Gait Distance (Feet): 200 Feet Assistive device: Rolling walker (2 wheeled) Gait Pattern/deviations: Step-to pattern;Decreased stride length;Trunk flexed Gait velocity:  Decreased Gait velocity interpretation: <1.31 ft/sec, indicative of household ambulator General Gait Details: SpO2 100% after ambulation on 3L/min supplemental O2. Pt moving slow but generally steady without overt LOB.   Stairs             Wheelchair Mobility    Modified Rankin (Stroke Patients Only)       Balance Overall balance assessment: Needs assistance Sitting-balance support: Bilateral upper extremity supported Sitting balance-Leahy Scale: Fair Sitting balance - Comments: Pt in chair with armrests.   Standing balance support: Bilateral upper extremity supported Standing balance-Leahy Scale: Fair Standing balance comment: Pt with fair static/dynamic standing balance evident by no LOBs and steadiness with RW during oral care.                            Cognition Arousal/Alertness: Awake/alert Behavior During Therapy: WFL for tasks assessed/performed Overall Cognitive Status: Impaired/Different from baseline Area of Impairment: Memory;Following commands;Safety/judgement;Awareness;Attention                   Current Attention Level: Sustained Memory: Decreased recall of precautions;Decreased short-term memory Following Commands: Follows one step commands with increased time Safety/Judgement: Decreased awareness of safety;Decreased awareness of deficits Awareness: Emergent   General Comments: Requires increased time for command following and required mod verbal cues to recall 3/3 precuations. Wife reports cognition is worse since being at hospital.      Exercises      General Comments General comments (skin integrity, edema, etc.): Wife Peter Congo) present throughout      Pertinent Vitals/Pain Pain Assessment: Faces Faces Pain Scale: Hurts a little bit Pain Location: back Pain Descriptors / Indicators: Aching;Discomfort Pain Intervention(s): Monitored during session;Limited activity  within patient's tolerance;Repositioned    Home Living  Family/patient expects to be discharged to:: [place]: Spouse/significant other Available Help at Discharge: Family;Available 24 hours/day Type of Home: House Home Access: Stairs to enter Entrance Stairs-Rails: Can reach both Home Layout: Two level;Able to live on main level with bedroom/bathroom Home Equipment: Shower seat - built in      Prior Function Level of Independence: Independent          PT Goals (current goals can now be found in the care plan section) Acute Rehab PT Goals Patient Stated Goal: Home eventually PT Goal Formulation: With patient/family Time For Goal Achievement: 10/03/20 Potential to Achieve Goals: Good Progress towards PT goals: Progressing toward goals    Frequency    Min 5X/week      PT Plan Current plan remains appropriate    Co-evaluation              AM-PAC PT "6 Clicks" Mobility   Outcome Measure  Help needed turning from your back to your side while in a flat bed without using bedrails?: A Little Help needed moving from lying on your back to sitting on the side of a flat bed without using bedrails?: A Lot Help needed moving to and from a bed to a chair (including a wheelchair)?: A Little Help needed standing up from a chair using your arms (e.g., wheelchair or bedside chair)?: A Little Help needed to walk in hospital room?: A Little Help needed climbing 3-5 steps with a railing? : A Lot 6 Click Score: 16    End of Session Equipment Utilized During Treatment: Gait belt;Back brace Activity Tolerance: Patient tolerated treatment well Patient left: in chair;with call bell/phone within reach;with chair alarm set;with family/visitor present Nurse Communication: Mobility status PT Visit Diagnosis: Unsteadiness on feet (R26.81);Pain Pain - part of body:  (back)     Time: [id number] PT Time Calculation (min) (ACUTE ONLY): 31 min  Charges:  $Gait Training: 23-37 mins                     [name], PT, DPT Acute Rehabilitation Services Pager: [phone] Office: [phone]    [name] 09/24/2020, 2:10 PM

## 2020-09-24 NOTE — TOC Progression Note (Signed)
Transition of Care [facility]) - Progression Note    Patient Details  Name: Ricky Nelson MRN: [id number] Date of Birth: 1935/10/14  [facility]) CM/SW Contact  Milinda Antis, [place] Phone Number: 09/24/2020, 12:37 PM  Clinical Narrative:    CSW spoke with [facility] in admissions at [facility] and informed the facility that the patient has chosen their agency.  Lexine Baton will call CSW tomorrow with information about when the facility can accept the patient.     Expected Discharge Plan: Skilled Nursing Facility Barriers to Discharge: SNF Pending bed offer  Expected Discharge Plan and Services Expected Discharge Plan: [place] In-house Referral: Clinical Social Work   Post Acute Care Choice: [place] Living arrangements for the past 2 months: Single Family Home                                       Social Determinants of Health (SDOH) Interventions    Readmission Risk Interventions No flowsheet data found.

## 2020-09-25 ENCOUNTER — Inpatient Hospital Stay (HOSPITAL_COMMUNITY): Payer: Medicare Other

## 2020-09-25 DIAGNOSIS — R6510 Systemic inflammatory response syndrome (SIRS) of non-infectious origin without acute organ dysfunction: Secondary | ICD-10-CM | POA: Diagnosis not present

## 2020-09-25 DIAGNOSIS — I2510 Atherosclerotic heart disease of native coronary artery without angina pectoris: Secondary | ICD-10-CM | POA: Diagnosis present

## 2020-09-25 DIAGNOSIS — E119 Type 2 diabetes mellitus without complications: Secondary | ICD-10-CM

## 2020-09-25 DIAGNOSIS — N189 Chronic kidney disease, unspecified: Secondary | ICD-10-CM | POA: Diagnosis present

## 2020-09-25 LAB — BASIC METABOLIC PANEL
Anion gap: 9 (ref 5–15)
BUN: 36 mg/dL — ABNORMAL HIGH (ref 8–23)
CO2: 26 mmol/L (ref 22–32)
Calcium: 8.7 mg/dL — ABNORMAL LOW (ref 8.9–10.3)
Chloride: 95 mmol/L — ABNORMAL LOW (ref 98–111)
Creatinine, Ser: 1.87 mg/dL — ABNORMAL HIGH (ref 0.61–1.24)
GFR, Estimated: 35 mL/min — ABNORMAL LOW (ref >60)
Glucose, Bld: 115 mg/dL — ABNORMAL HIGH (ref 70–99)
Potassium: 4.6 mmol/L (ref 3.5–5.1)
Sodium: 130 mmol/L — ABNORMAL LOW (ref 135–145)

## 2020-09-25 LAB — GLUCOSE, CAPILLARY
Glucose-Capillary: 79 mg/dL (ref 70–99)
Glucose-Capillary: 105 mg/dL — ABNORMAL HIGH (ref 70–99)
Glucose-Capillary: 88 mg/dL (ref 70–99)
Glucose-Capillary: 91 mg/dL (ref 70–99)

## 2020-09-25 LAB — RESP PANEL BY RT-PCR (FLU A&B, COVID) ARPGX2
Influenza A by PCR: NEGATIVE
Influenza B by PCR: NEGATIVE
SARS Coronavirus 2 by RT PCR: NEGATIVE

## 2020-09-25 LAB — CBC WITH DIFFERENTIAL/PLATELET
Abs Immature Granulocytes: 0.23 10*3/uL — ABNORMAL HIGH (ref 0.00–0.07)
Basophils Absolute: 0 10*3/uL (ref 0.0–0.1)
Basophils Relative: 0 %
Eosinophils Absolute: 0 10*3/uL (ref 0.0–0.5)
Eosinophils Relative: 0 %
HCT: 28.6 % — ABNORMAL LOW (ref 39.0–52.0)
Hemoglobin: 9 g/dL — ABNORMAL LOW (ref 13.0–17.0)
Immature Granulocytes: 1 %
Lymphocytes Relative: 2 %
Lymphs Abs: 0.4 10*3/uL — ABNORMAL LOW (ref 0.7–4.0)
MCH: 30.5 pg (ref 26.0–34.0)
MCHC: 31.5 g/dL (ref 30.0–36.0)
MCV: 96.9 fL (ref 80.0–100.0)
Monocytes Absolute: 1.3 10*3/uL — ABNORMAL HIGH (ref 0.1–1.0)
Monocytes Relative: 7 %
Neutro Abs: 15.7 10*3/uL — ABNORMAL HIGH (ref 1.7–7.7)
Neutrophils Relative %: 90 %
Platelets: 206 10*3/uL (ref 150–400)
RBC: 2.95 MIL/uL — ABNORMAL LOW (ref 4.22–5.81)
RDW: 15.1 % (ref 11.5–15.5)
WBC: 17.6 10*3/uL — ABNORMAL HIGH (ref 4.0–10.5)
nRBC: 0 % (ref 0.0–0.2)

## 2020-09-25 LAB — CBC
HCT: 29.2 % — ABNORMAL LOW (ref 39.0–52.0)
Hemoglobin: 9.4 g/dL — ABNORMAL LOW (ref 13.0–17.0)
MCH: 30.4 pg (ref 26.0–34.0)
MCHC: 32.2 g/dL (ref 30.0–36.0)
MCV: 94.5 fL (ref 80.0–100.0)
Platelets: 192 10*3/uL (ref 150–400)
RBC: 3.09 MIL/uL — ABNORMAL LOW (ref 4.22–5.81)
RDW: 15 % (ref 11.5–15.5)
WBC: 7.6 10*3/uL (ref 4.0–10.5)
nRBC: 0.3 % — ABNORMAL HIGH (ref 0.0–0.2)

## 2020-09-25 LAB — PROCALCITONIN: Procalcitonin: 31.26 ng/mL

## 2020-09-25 MED ORDER — SODIUM CHLORIDE 0.9 % IV SOLN: INTRAVENOUS | Status: AC

## 2020-09-25 MED ORDER — INSULIN DETEMIR 100 UNIT/ML ~~LOC~~ SOLN
20.0000 [IU] | Freq: Every day | SUBCUTANEOUS | Status: DC
  Administered 2020-09-25 – 2020-10-10 (×16): 20 [IU] via SUBCUTANEOUS
  Filled 2020-09-25 (×19): qty 0.2

## 2020-09-25 MED ORDER — IOHEXOL 350 MG/ML SOLN
70.0000 mL | Freq: Once | INTRAVENOUS | Status: AC | PRN
  Administered 2020-09-25: 70 mL via INTRAVENOUS

## 2020-09-25 NOTE — Progress Notes (Signed)
Patient had new onset of tremors, checked vital signs and his blood pressure was elevated 158/113 with a MAP of 103. Did a stat EKG, and pt running tachy. Called Rapid came to look at the pt, pt in bed tremors continued.

## 2020-09-25 NOTE — Progress Notes (Signed)
Patient transferred to [address]  room 4 with his belongings, wife was at bedside ,report given to the assigned nurse, no  further questions or concerns.

## 2020-09-25 NOTE — Progress Notes (Addendum)
Rapid Response Rounding Note  Rounded back on pt at 1520. Pt noted to be resting, responds to painful stimuli. Skin is very warm. VS: T 102.2F, 113/66, HR 123, RR 28, SpO2 94% on 4LNC. Warm blankets and excess linens removed. Ice packs applied. Jade RN notified of fever. PRN Tylenol order in place. Per primary RN, pt has been resting since my last evaluation. Pt placed on CPAP with 4LPM bled in.   WBC 7.6. Lung sounds remain clear. Fever possibly related to warm blankets which were applied earlier for pt tremors.  Plan of Care:  VS per MEWS protocol PRN for fever, ice packs for fever, reduce excess linen  Call rapid response for additional needs.  Kennis Carina, RN Rapid Response RN

## 2020-09-25 NOTE — Progress Notes (Signed)
New Orders per Nundkumar placed for Red MEWS. Ice placed on pt and tylenol suppository given, will continue to monitor vital signs    09/25/20 1520  Assess: MEWS Score  Temp (!) 102.9 F (39.4 C)  BP 113/66  Pulse Rate (!) 123  Resp (!) 28  Level of Consciousness Responds to Pain  SpO2 94 %  O2 Device Nasal Cannula  Patient Activity (if Appropriate) In bed  O2 Flow Rate (L/min) 4 L/min  Assess: MEWS Score  MEWS Temp 2  MEWS Systolic 0  MEWS Pulse 2  MEWS RR 2  MEWS LOC 2  MEWS Score 8  MEWS Score Color Red  Assess: if the MEWS score is Yellow or Red  Were vital signs taken at a resting state? Yes  Focused Assessment Change from prior assessment (see assessment flowsheet) (increased temp,increased respirations, sustaing elevated heart rate)  Early Detection of Sepsis Score *See Row Information* High  MEWS guidelines implemented *See Row Information* Yes  Treat  Pain Scale Faces  Faces Pain Scale 2  Take Vital Signs  Increase Vital Sign Frequency  Red: Q 1hr X 4 then Q 4hr X 4, if remains red, continue Q 4hrs  Escalate  MEWS: Escalate Red: discuss with charge nurse/RN and provider, consider discussing with RRT  Notify: Charge Nurse/RN  Name of Charge Nurse/RN Notified Lauren, RN  Date Charge Nurse/RN Notified 09/25/20  Time Charge Nurse/RN Notified [place]  Notify: Provider  Provider Name/Title Kathyrn Sheriff, MD  Date Provider Notified 09/25/20  Time Provider Notified 1530  Notification Type Page  Notification Reason Change in status  Provider response See new orders  Date of Provider Response 09/25/20  Time of Provider Response 1620  Notify: Rapid Response  Name of Rapid Response RN Notified Wilburn Cornelia, RN  Date Rapid Response Notified 09/25/20  Time Rapid Response Notified 1030  Document  Patient Outcome Other (Comment) (temperature trending down)  Progress note created (see row info) Yes

## 2020-09-25 NOTE — Significant Event (Addendum)
Rapid Response Event Note   Reason for Call :  Tremors, ST-HR 103 bpm  Initial Focused Assessment:  Pt lying in bed, notably tense with tremors. Skin feels warm, dry to touch. Pale. Extremities are warm. He is alert, endorses feeling like his muscles are pinching. Denies pain. Lung sounds are clear. Abdomen is round, soft. Bowel sounds active.   VS: T 99.24F rectally, BP 138/110, HR 103, RR 22, SpO2 100% on 3LNC CBG: 105  Interventions:  -Rectal temp -EKG completed upon my arrival  Plan of Care:  -PRN Robaxin -Provide warm blanket for comfort, modestly in the setting of low grade fever -Reevaluate VS once pt tremors subside -VS per unit protocol  Call rapid response for additional needs  Event Summary:  MD Notified: Per RN Call Time: 1012 Arrival Time: [place] End Time: [place], RN

## 2020-09-25 NOTE — TOC Progression Note (Signed)
Transition of Care [facility]) - Progression Note    Patient Details  Name: Ricky Nelson MRN: [id number] Date of Birth: [birth year]  Transition of Care [facility]) CM/SW Contact  Milinda Antis, LCSWA Phone Number: 09/25/2020, 1:19 PM  Clinical Narrative:     CSW was contacted by Lexine Baton with [facility].  The facility can accept the patient tomorrow if he is medically ready.  Expected Discharge Plan: Skilled Nursing Facility Barriers to Discharge: SNF Pending bed offer  Expected Discharge Plan and Services Expected Discharge Plan: [place] In-house Referral: Clinical Social Work   Post Acute Care Choice: [place] Living arrangements for the past 2 months: Single Family Home                                       Social Determinants of Health (SDOH) Interventions    Readmission Risk Interventions No flowsheet data found.

## 2020-09-25 NOTE — Progress Notes (Signed)
Called by RN, pt remains somnolent, difficult to arouse. Remains tachy and temp >103 rectal.  Will get blood, urine cx Will get CTA chest with new tachycardia and episodes of hypoxia Medicine consult    Consuella Lose, MD [facility]

## 2020-09-25 NOTE — Progress Notes (Signed)
PT Cancellation Note  Patient Details Name: Ricky Nelson MRN: [id number] DOB: 1935/07/01   Cancelled Treatment:    Reason Eval/Treat Not Completed: Medical issues which prohibited therapy.  Hold PT for now, had a rapid response called for tremors and tachycardia.  Follow up at another time.   Ramond Dial 09/25/2020, 12:59 PM  Mee Hives, PT MS Acute Rehab Dept. Number: [place] and [place]

## 2020-09-25 NOTE — Consult Note (Addendum)
[facility] Medical Consultation  Ricky Nelson [id number] DOB: May 14, 1935 DOA: 09/18/2020 PCP: Kathyrn Lass   Requesting physician: Consuella Lose, MD (neurosurgery) Date of consultation: 09/25/2020 Reason for consultation: Fever, medical management  Impression/Recommendations Active Problems:   Lumbar spinal stenosis   Sleep apnea   Diabetes mellitus without complication ([place])   Coronary artery disease   CKD (chronic kidney disease)   SIRS (systemic inflammatory response syndrome) ([place])   SIRS: Patient developing new fever with T-max 103.7 F rectally on 9/6.  He has had associated tachycardia with pulse up to 123, tachypnea with respiratory rate up to 28, and transient hypotension with BP as low as 99/51 and MAP 62.  Currently no clear infectious source.  Surgical site appears clean per neurosurgery NP.  COVID and influenza negative.  CTA chest negative for PE or pneumonia, small right pleural effusion with bibasilar atelectasis noted. -Obtain blood cultures, UA/urine culture -Repeat CBC, check lactic acid, procalcitonin -Start NS'@75'$  mL/hour -If having persistent fevers we will consider adding broad-spectrum antibiotics after blood and urine cultures obtained -Agree with transfer to progressive care unit  Addendum: Labs show new leukocytosis with significantly elevated procalcitonin.  Blood cultures have been obtained.  Still waiting on urine.  Will start on empiric broad-spectrum antibiotics with IV vancomycin and cefepime pending further culture data.  Acute encephalopathy: Hypersomnolent on exam.  Currently wearing CPAP.  Likely multifactorial in setting of fever and sedating medications. -Check ammonia -Minimize sedating medications, hold narcotics, Robaxin  Hyponatremia: Mild, will start on gentle IV fluids as above.  Patient is net -9.8 L per current I/O's.  S/p L2-L4 decompression/fusion 09/18/2020: Per neurosurgery.  CKD stage IIIb: Fluctuating renal  function during admission, will start on gentle IV fluids as above and monitor trend.  Type 2 diabetes: Hold home glipizide, Jardiance, Victoza and reduce Levemir dosing while he is too somnolent to eat.  CAD: Plavix has been held in the perioperative period.  Continue Toprol-XL and Imdur as BP allows.  Iron deficiency anemia: S/p 2 unit PRBC for acute blood loss anemia postoperatively with improvement in hemoglobin 9.4 this morning.  Hypertension: Continue Toprol-XL and Imdur as BP allows.  OSA: Continue CPAP nightly.   TRH will followup again tomorrow. Please contact us if we can be of assistance in the meanwhile. Thank you for this consultation.  Chief Complaint: Fever  HPI:  [name] is a 85 y.o. male with medical history significant for CAD, T2DM, CKD stage IIIb, HTN, OSA on CPAP who was admitted under the neurosurgery service after undergoing L2-L4 decompression/fusion on 09/18/2020.  Patient is unable to provide history due to hypersomnolence and is otherwise obtained by neurosurgery team, chart review, and spouse at bedside.  Patient had new fever developing today with transient episodes of tachycardia and hypotension.  No clear infectious source.  Surgical site appears clean.  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  Blood cultures and urine cultures were ordered but still pending collection.  Review of Systems:  Unable to obtain full review of systems due to hypersomnolence.  Past Medical History:  Diagnosis Date   Anemia    Anginal pain ([place])    2018; chronic stable angina 06/2020 (Dr. Atilano Median)   Cancer [facility])    bladder   CKD (chronic kidney disease)    Coronary artery disease    Diabetes mellitus without complication ([place])    type 2   Hypertension    Myocardial infarction ([place]) 1995   Pneumonia 1995   Sleep apnea 2012   Past Surgical  History:  Procedure Laterality Date   BACK SURGERY  2012   CAROTID ENDARTERECTOMY Right    right CEA ~ 2004, Dr. Heinz Knuckles, [place]    CORONARY ANGIOPLASTY  2012   CORONARY ARTERY BYPASS GRAFT  1995   EYE SURGERY Bilateral 2018   cataracts, since removed   Social History:  reports that he has never smoked. He has never used smokeless tobacco. He reports that he does not currently use alcohol. He reports that he does not use drugs.  Allergies  Allergen Reactions   Allergy Relief [Chlorpheniramine Maleate] Shortness Of Breath   Diazepam     Other reaction(s): Hypotension, Low blood pressure, Other (See Comments)   Diphenhydramine Hcl Shortness Of Breath    Other reaction(s): Other (See Comments), Stopped breathing Other reaction(s): Shortness Of Breath Shortness of breath with benadryl    Diphenhydramine-Zinc Acetate Shortness Of Breath   Atorvastatin Other (See Comments)    Other reaction(s): Muscle pain, Other (See Comments), Unknown Elevated CK      Fluvastatin Other (See Comments)    Other reaction(s): Other (See Comments) myalgias   Lisinopril     Other reaction(s): Hyperkalemia, Other (See Comments)   Metformin And Related     Other reaction(s): Other Renal function   Piperacillin-Tazobactam In Dex Rash    Other reaction(s): Rash Other reaction(s): Rash    Diphenhydramine     Other reaction(s): Low blood pressure   Other Other (See Comments)    Lescol   History reviewed. No pertinent family history.  Prior to Admission medications   Medication Sig Start Date End Date Taking? Authorizing Provider  buPROPion (WELLBUTRIN XL) 150 MG 24 hr tablet Take 150 mg by mouth every morning. 09/06/20  Yes [provider]  clopidogrel (PLAVIX) 75 MG tablet Take 75 mg by mouth daily. 08/23/20  Yes [provider]  Docusate Sodium (DSS) 100 MG CAPS Take 100 mg by mouth daily. 05/24/19  Yes [provider]  DULoxetine (CYMBALTA) 60 MG capsule Take 60 mg by mouth daily. 09/06/20  Yes [provider]  fluticasone (FLONASE) 50 MCG/ACT nasal spray Place 1 spray into both nostrils daily as  needed for allergies.   Yes [provider]  gabapentin (NEURONTIN) 300 MG capsule Take 300 mg by mouth 3 (three) times daily.   Yes [provider]  glipiZIDE (GLUCOTROL) 10 MG tablet Take 20 mg by mouth 2 (two) times daily before a meal.   Yes [provider]  insulin detemir (LEVEMIR) 100 UNIT/ML injection Inject 35 Units into the skin daily.   Yes [provider]  Ipratropium-Albuterol (COMBIVENT RESPIMAT) 20-100 MCG/ACT AERS respimat Inhale 2 puffs into the lungs every 6 (six) hours.   Yes [provider]  isosorbide mononitrate (IMDUR) 30 MG 24 hr tablet Take 30 mg by mouth daily.   Yes [provider]  JARDIANCE 10 MG TABS tablet Take 10 mg by mouth daily. 08/31/20  Yes [provider]  ketorolac (ACULAR) 0.5 % ophthalmic solution Place 1 drop into the left eye daily. 08/13/20  Yes [provider]  magnesium oxide (MAG-OX) 400 MG tablet Take 400 mg by mouth 2 (two) times daily.   Yes [provider]  metoprolol succinate (TOPROL-XL) 50 MG 24 hr tablet Take 50 mg by mouth daily. Take with or immediately following a meal.   Yes [provider]  Multiple Vitamin (THERA-TABS PO) Take 1 tablet by mouth daily.   Yes [provider]  nitroGLYCERIN (NITROSTAT)  0.4 MG SL tablet Place 0.4 mg under the tongue every 5 (five) minutes as needed for chest pain.   Yes [provider]  omega-3 acid ethyl esters (LOVAZA) 1 g capsule Take 1 g by mouth daily.   Yes [provider]  rosuvastatin (CRESTOR) 20 MG tablet Take 20 mg by mouth daily.   Yes [provider]  vitamin B-12 (CYANOCOBALAMIN) 1000 MCG tablet Take 1,000 mcg by mouth daily.   Yes [provider]  liraglutide (VICTOZA) 18 MG/3ML SOPN Inject 1.8 mg into the skin at bedtime.    [provider]  triamcinolone cream (KENALOG) 0.1 % Apply 1 application topically 2 (two) times daily as needed (itching).     [provider]   Physical Exam: Blood pressure 103/62, pulse 97, temperature (!) 100.7 F (38.2 C), temperature source Axillary, resp. rate 18, height '5\' 9"'$  (1.753 m), weight 98.2 kg, SpO2 97 %. Vitals:   09/25/20 1724 09/25/20 1842  BP: (!) 99/51 103/62  Pulse: 100 97  Resp: 17 18  Temp: (!) 101.9 F (38.8 C) (!) 100.7 F (38.2 C)  SpO2: 96% 97%  Exam limited due to hypersomnolence. Constitutional: Obese man resting in bed with head elevated currently wearing CPAP, hypersomnolent Eyes: PERRL, lids and conjunctivae normal ENMT: Mucous membranes are moist. Posterior pharynx clear of any exudate or lesions.poor dentition.  Neck: normal, supple, no masses. Respiratory: clear to auscultation bilaterally, no wheezing, no crackles. Normal respiratory effort. No accessory muscle use.  Cardiovascular: Regular rate and rhythm, no murmurs / rubs / gallops. No extremity edema. 2+ pedal pulses. Abdomen: no tenderness, no masses palpated. No hepatosplenomegaly. Musculoskeletal: no clubbing / cyanosis. No joint deformity upper and lower extremities. Good ROM, no contractures. Normal muscle tone.  Skin: Unable to visualize surgical site, per neurosurgery NP and nursing has appeared clean Neurologic: Hypersomnolent, grimaces with noxious stimuli, moves extremities spontaneously Psychiatric: Hypersomnolent, grimaces with noxious stimuli  Labs on Admission:  Basic Metabolic Panel: Recent Labs  Lab 09/21/20 1120 09/22/20 0726 09/23/20 0057 09/24/20 0450 09/25/20 0243  NA 132* 132* 130* 131* 130*  K 4.6 4.6 4.8 4.0 4.6  CL 102 100 99 97* 95*  CO2 '23 25 22 26 26  '$ GLUCOSE 156* 123* 154* 99 115*  BUN 36* 33* 33* 33* 36*  CREATININE 1.82* 1.58* 1.65* 1.79* 1.87*  CALCIUM 8.1* 8.6* 8.5* 8.9 8.7*   Liver Function Tests: Recent Labs  Lab 09/21/20 1120  AST 57*  ALT 10  ALKPHOS 51  BILITOT 0.7  PROT 6.0*  ALBUMIN 2.6*   No results for input(s): LIPASE, AMYLASE in the last 168  hours. No results for input(s): AMMONIA in the last 168 hours. CBC: Recent Labs  Lab 09/21/20 1120 09/22/20 0726 09/23/20 0057 09/24/20 0450 09/25/20 0243  WBC 7.7 8.3 7.4 6.1 7.6  HGB 6.0* 8.7* 8.9* 9.5* 9.4*  HCT 19.8* 27.5* 27.1* 29.3* 29.2*  MCV 101.5* 95.2 93.8 94.5 94.5  PLT 124* 147* 158 189 192   Cardiac Enzymes: No results for input(s): CKTOTAL, CKMB, CKMBINDEX, TROPONINI in the last 168 hours. BNP: Invalid input(s): POCBNP CBG: Recent Labs  Lab 09/24/20 2046 09/25/20 0645 09/25/20 1009 09/25/20 1254 09/25/20 1606  GLUCAP 176* 79 105* 88 91    Radiological Exams on Admission: CT Angio Chest Pulmonary Embolism (PE) W or WO Contrast  Result Date: 09/25/2020 CLINICAL DATA:  PE suspected, high prob EXAM: CT ANGIOGRAPHY CHEST WITH CONTRAST TECHNIQUE: Multidetector CT imaging of the chest was performed using the  standard protocol during bolus administration of intravenous contrast. Multiplanar CT image reconstructions and MIPs were obtained to evaluate the vascular anatomy. CONTRAST:  27m OMNIPAQUE IOHEXOL 350 MG/ML SOLN COMPARISON:  10/15/2014 FINDINGS: Cardiovascular: The pulmonary arteries are obscured in the lung bases due to respiratory motion. No visible suspicious filling defects to suggest pulmonary emboli. Heart is normal size. Aorta is normal caliber. Prior CABG. Aortic calcifications. Mediastinum/Nodes: No mediastinal, hilar, or axillary adenopathy. Trachea and esophagus are unremarkable. Thyroid unremarkable. Lungs/Pleura: Small right pleural effusion. Right base compressive atelectasis and minimal left base atelectasis. No effusions on the left. Upper Abdomen: Imaging into the upper abdomen demonstrates no acute findings. Musculoskeletal: Chest wall soft tissues are unremarkable. No acute bony abnormality. Review of the MIP images confirms the above findings. IMPRESSION: No visible pulmonary embolus. Pulmonary arteries obscured in the lung bases due to respiratory  motion. Small right pleural effusion.  Bibasilar atelectasis. Aortic Atherosclerosis (ICD10-I70.0). Electronically Signed   By: KRolm BaptiseM.D.   On: 09/25/2020 19:01    EKG: Independently reviewed. Sinus tachycardia, rate 108, RBBB, motion artifact.  Rate is faster when compared to prior.   VZada Finders MD [facility]   If 7PM-7AM, please contact night-coverage [email or web address] 09/25/2020, 7:28 PM

## 2020-09-25 NOTE — Progress Notes (Signed)
  NEUROSURGERY PROGRESS NOTE   No issues overnight. Pt had episode of "tremor" this afternoon. Wife describes it as "chills."   EXAM:  BP (!) 158/113 (BP Location: Left Arm)   Pulse 87   Temp 98.3 F (36.8 C) (Rectal)   Resp 17   Ht '5\' 9"'$  (1.753 m)   Wt 98.2 kg   SpO2 93%   BMI 31.96 kg/m   Very somnolent, arouses with stimulation Only speaks when aroused CN grossly intact  MAE  IMPRESSION:  85 y.o. male s/p lumbar fusion, protracted course with acute blood loss anemia responsive to 2U PRBC transfusion. Episodes of somnolence. Vitals appear relatively stable, chemistry and CBC largely stable. EKG reviewed, not significantly different from 8/24 by my review.  PLAN: - Will cont to monitor - If improved can d/c to SNF tomorrow   Consuella Lose, MD [facility]

## 2020-09-25 NOTE — Progress Notes (Signed)
Pt changed MEWS status from green to red, rectal temperature is 103.7. Rectal tylenol given and ice packs placed will reassess temperature

## 2020-09-26 ENCOUNTER — Inpatient Hospital Stay (HOSPITAL_COMMUNITY): Payer: Medicare Other

## 2020-09-26 DIAGNOSIS — R6510 Systemic inflammatory response syndrome (SIRS) of non-infectious origin without acute organ dysfunction: Secondary | ICD-10-CM | POA: Diagnosis not present

## 2020-09-26 LAB — CBC
HCT: 28 % — ABNORMAL LOW (ref 39.0–52.0)
Hemoglobin: 8.7 g/dL — ABNORMAL LOW (ref 13.0–17.0)
MCH: 30.3 pg (ref 26.0–34.0)
MCHC: 31.1 g/dL (ref 30.0–36.0)
MCV: 97.6 fL (ref 80.0–100.0)
Platelets: 201 10*3/uL (ref 150–400)
RBC: 2.87 MIL/uL — ABNORMAL LOW (ref 4.22–5.81)
RDW: 14.9 % (ref 11.5–15.5)
WBC: 13.5 10*3/uL — ABNORMAL HIGH (ref 4.0–10.5)
nRBC: 0 % (ref 0.0–0.2)

## 2020-09-26 LAB — GLUCOSE, CAPILLARY
Glucose-Capillary: 153 mg/dL — ABNORMAL HIGH (ref 70–99)
Glucose-Capillary: 123 mg/dL — ABNORMAL HIGH (ref 70–99)
Glucose-Capillary: 193 mg/dL — ABNORMAL HIGH (ref 70–99)
Glucose-Capillary: 189 mg/dL — ABNORMAL HIGH (ref 70–99)

## 2020-09-26 LAB — BASIC METABOLIC PANEL
Anion gap: 10 (ref 5–15)
BUN: 41 mg/dL — ABNORMAL HIGH (ref 8–23)
CO2: 26 mmol/L (ref 22–32)
Calcium: 8.6 mg/dL — ABNORMAL LOW (ref 8.9–10.3)
Chloride: 94 mmol/L — ABNORMAL LOW (ref 98–111)
Creatinine, Ser: 2.08 mg/dL — ABNORMAL HIGH (ref 0.61–1.24)
GFR, Estimated: 31 mL/min — ABNORMAL LOW (ref >60)
Glucose, Bld: 176 mg/dL — ABNORMAL HIGH (ref 70–99)
Potassium: 5.5 mmol/L — ABNORMAL HIGH (ref 3.5–5.1)
Sodium: 130 mmol/L — ABNORMAL LOW (ref 135–145)

## 2020-09-26 LAB — LACTIC ACID, PLASMA
Lactic Acid, Venous: 1.5 mmol/L (ref 0.5–1.9)
Lactic Acid, Venous: 1 mmol/L (ref 0.5–1.9)

## 2020-09-26 LAB — AMMONIA: Ammonia: 20 umol/L (ref 9–35)

## 2020-09-26 LAB — POTASSIUM: Potassium: 4.8 mmol/L (ref 3.5–5.1)

## 2020-09-26 MED ORDER — HALOPERIDOL 1 MG PO TABS
0.5000 mg | ORAL_TABLET | Freq: Three times a day (TID) | ORAL | Status: DC | PRN
  Administered 2020-09-26: 0.5 mg via ORAL
  Filled 2020-09-26: qty 1

## 2020-09-26 MED ORDER — SODIUM CHLORIDE 0.9 % IV SOLN: INTRAVENOUS | Status: DC

## 2020-09-26 MED ORDER — VANCOMYCIN HCL 2000 MG/400ML IV SOLN
2000.0000 mg | INTRAVENOUS | Status: AC
  Administered 2020-09-26: 2000 mg via INTRAVENOUS
  Filled 2020-09-26: qty 400

## 2020-09-26 MED ORDER — CEFEPIME HCL 2 G IJ SOLR
2.0000 g | Freq: Two times a day (BID) | INTRAMUSCULAR | Status: AC
Start: 2020-09-26 — End: 2020-10-02
  Administered 2020-09-26 – 2020-10-02 (×13): 2 g via INTRAVENOUS
  Filled 2020-09-26 (×13): qty 2

## 2020-09-26 MED ORDER — HALOPERIDOL 1 MG PO TABS: 0.5000 mg | ORAL_TABLET | Freq: Four times a day (QID) | ORAL | Status: DC | PRN

## 2020-09-26 MED ORDER — HALOPERIDOL LACTATE 5 MG/ML IJ SOLN: 0.5000 mg | Freq: Four times a day (QID) | INTRAMUSCULAR | Status: DC | PRN

## 2020-09-26 MED ORDER — VANCOMYCIN HCL 1500 MG/300ML IV SOLN: 1500.0000 mg | INTRAVENOUS | Status: DC

## 2020-09-26 MED ORDER — SODIUM CHLORIDE 0.9 % IV SOLN
2.0000 g | INTRAVENOUS | Status: AC
  Administered 2020-09-26: 2 g via INTRAVENOUS
  Filled 2020-09-26: qty 2

## 2020-09-26 NOTE — Progress Notes (Addendum)
Physical Therapy Treatment Patient Details Name: Ricky Nelson MRN: [id number] DOB: 1935-03-28 Today's Date: 09/26/2020    History of Present Illness Pt is an 85 y/o male admitted to [facility] on 8/30 for L2-4 fusion with extension of hardware L2-L5. PMH significant for HTN, DM2, Bladder cancer, MI in 1995, Sleep apnea.    PT Comments    Patient oriented to self this session. Difficult to reorient. Patient very restless and impulsive this date. RN present to assist. Unable to get accurate spO2 reading due to patient pulling off pulse ox. Patient ambulated 93' with RW and minA initially, bilateral knee buckling noted at halfway point but patient able to correct. During backwards stepping towards bed, patient with severe bilateral knee buckling requiring maxA+2 to return to EOB to prevent fall. Continue to recommend SNF for ongoing Physical Therapy. Placed patient back into mittens by request of RN.    Follow Up Recommendations  SNF;Supervision/Assistance - 24 hour     Equipment Recommendations  Rolling Ema Hebner with 5" wheels    Recommendations for Other Services       Precautions / Restrictions Precautions Precautions: Back Precaution Booklet Issued: Yes (comment) Precaution Comments: Reviewed precautions verbally Required Braces or Orthoses: Spinal Brace Spinal Brace: Lumbar corset;Applied in sitting position Restrictions Weight Bearing Restrictions: No    Mobility  Bed Mobility Overal bed mobility: Needs Assistance Bed Mobility: Rolling;Sidelying to Sit Rolling: Supervision Sidelying to sit: Min assist     Sit to sidelying: Total assist;+2 for physical assistance General bed mobility comments: minA for trunk elevation to EOB. cues for log roll technique. TotalA+2 to return to bed due to patient resisting to lay down.    Transfers Overall transfer level: Needs assistance Equipment used: Rolling Iriel Nason (2 wheeled) Transfers: Sit to/from Stand Sit to Stand: Min assist          General transfer comment: minA to rise and steady, cues for hand placement  Ambulation/Gait Ambulation/Gait assistance: Min assist;Max assist;+2 physical assistance Gait Distance (Feet): 15 Feet Assistive device: Rolling Mizraim Harmening (2 wheeled) Gait Pattern/deviations: Step-to pattern;Decreased stride length;Trunk flexed Gait velocity: decreased   General Gait Details: minA initially to ambulate with instances of bilateral knee buckling but patient able to catch self. RN present to assist with guiding patient back to bed due to confusion and impulsivity. During backwards stepping towards EOB, patient with severe bilateral knee buckling requiring maxA+2 to assist patient to EOB to prevent fall.   Stairs             Wheelchair Mobility    Modified Rankin (Stroke Patients Only)       Balance Overall balance assessment: Needs assistance Sitting-balance support: Bilateral upper extremity supported Sitting balance-Leahy Scale: Fair     Standing balance support: Bilateral upper extremity supported;During functional activity Standing balance-Leahy Scale: Poor Standing balance comment: reliant on UE support and external assist                            Cognition Arousal/Alertness: Awake/alert Behavior During Therapy: Impulsive;Anxious;Restless Overall Cognitive Status: Impaired/Different from baseline Area of Impairment: Orientation;Memory;Following commands;Attention;Safety/judgement;Awareness;Problem solving                 Orientation Level: Disoriented to;Place;Time;Situation Current Attention Level: Sustained Memory: Decreased recall of precautions;Decreased short-term memory Following Commands: Follows one step commands inconsistently Safety/Judgement: Decreased awareness of safety;Decreased awareness of deficits Awareness: Intellectual Problem Solving: Difficulty sequencing;Requires verbal cues;Requires tactile cues General Comments: impulsive and  restless throughout  session pulling at wires and fidgeting with blankets. Oriented to self this session with difficult time redirecting, stating "I need to go home and get medical attention". RN present to assist due to restlessness and impulsivity      Exercises      General Comments        Pertinent Vitals/Pain Pain Assessment: Faces Faces Pain Scale: Hurts a little bit Pain Location: back Pain Descriptors / Indicators: Discomfort;Grimacing Pain Intervention(s): Monitored during session;Repositioned    Home Living                      Prior Function            PT Goals (current goals can now be found in the care plan section) Acute Rehab PT Goals Patient Stated Goal: did not state PT Goal Formulation: With patient/family Time For Goal Achievement: 10/03/20 Potential to Achieve Goals: Good Progress towards PT goals: Progressing toward goals    Frequency    Min 5X/week      PT Plan Current plan remains appropriate    Co-evaluation              AM-PAC PT "6 Clicks" Mobility   Outcome Measure  Help needed turning from your back to your side while in a flat bed without using bedrails?: A Little Help needed moving from lying on your back to sitting on the side of a flat bed without using bedrails?: A Little Help needed moving to and from a bed to a chair (including a wheelchair)?: A Little Help needed standing up from a chair using your arms (e.g., wheelchair or bedside chair)?: A Little Help needed to walk in hospital room?: A Lot Help needed climbing 3-5 steps with a railing? : Total 6 Click Score: 15    End of Session Equipment Utilized During Treatment: Gait belt;Back brace Activity Tolerance: Patient tolerated treatment well Patient left: in bed;with call bell/phone within reach;with bed alarm set;with restraints reapplied Nurse Communication: Mobility status PT Visit Diagnosis: Unsteadiness on feet (R26.81);Pain     Time: [id number] PT  Time Calculation (min) (ACUTE ONLY): 32 min  Charges:  $Gait Training: 23-37 mins                     Iva Montelongo A. Gilford Rile PT, DPT Acute Rehabilitation Services Pager [phone] Office [phone]    Linna Hoff 09/26/2020, 5:34 PM

## 2020-09-26 NOTE — TOC Progression Note (Signed)
Transition of Care [facility]) - Progression Note    Patient Details  Name: Ricky Nelson MRN: [id number] Date of Birth: 08-17-1935  Transition of Care [facility]) CM/SW [place], [place] Phone Number: 09/26/2020, 11:35 AM  Clinical Narrative:     CSW informed Rober Minion -patient not medically stable for discharge today.  Thurmond Butts, MSW, LCSW Clinical Social Worker    Expected Discharge Plan: Skilled Nursing Facility Barriers to Discharge: SNF Pending bed offer  Expected Discharge Plan and Services Expected Discharge Plan: [place] In-house Referral: Clinical Social Work   Post Acute Care Choice: [place] Living arrangements for the past 2 months: Single Family Home                                       Social Determinants of Health (SDOH) Interventions    Readmission Risk Interventions No flowsheet data found.

## 2020-09-26 NOTE — Progress Notes (Signed)
  NEUROSURGERY PROGRESS NOTE   Transferred to stepdown last night. Vanc/Cefepime started.  EXAM:  BP 116/63 (BP Location: Left Arm)   Pulse 84   Temp 98.5 F (36.9 C) (Oral)   Resp 17   Ht '5\' 9"'$  (1.753 m)   Wt 98.2 kg   SpO2 96%   BMI 31.96 kg/m   Awake, alert, disoriented  Speech fluent CN grossly intact  MAE well  IMPRESSION:  85 y.o. male s/p lumbar fusion with AMS likely due to underlying infection/SIRS - Blood/urine cx pending - CTA chest negative for PE or pneumonia  PLAN: - Cont vanc/cefepime, will f/u culture results - Appreciate medicine assistance - Cont to mobilize with PT/OT as tolerated   Consuella Lose, MD [facility]

## 2020-09-26 NOTE — Progress Notes (Signed)
Consult NOTE    Ricky Nelson  [id number] DOB: 02/02/1935 DOA: 09/18/2020 PCP: Pcp, No   No chief complaint on file.  Brief Narrative:  Ricky Nelson is Ricky Nelson 85 y.o. male with medical history significant for CAD, T2DM, CKD stage IIIb, HTN, OSA on CPAP who was admitted under the neurosurgery service after undergoing L2-L4 decompression/fusion on 09/18/2020.  Patient had new fever developing 9/6  with transient episodes of tachycardia and hypotension.  No clear infectious source.  Surgical site appears clean.  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  Blood cultures and urine cultures were ordered but still pending collection.  Assessment & Plan:   Active Problems:   Lumbar spinal stenosis   Sleep apnea   Diabetes mellitus without complication (HCC)   Coronary artery disease   CKD (chronic kidney disease)   SIRS (systemic inflammatory response syndrome) ([place])  SIRS: Patient developing new fever with T-max 103.7 F rectally on 9/6. Also with reported rigors. He has had associated tachycardia with pulse up to 123, tachypnea with respiratory rate up to 28, and transient hypotension with BP as low as 99/51 and MAP 62.  Currently no clear infectious source -> urinary vs pulmonary vs surgical?  Surgical site appears clean per neurosurgery NP.   - COVID and influenza negative.   - UA/culture pending - CT PE with small R effusion, could represent pna (follow repeat CXR in AM) - will continue to follow surgical site exam - follow blood cultures  - vancomycin, cefepime  Acute Hypoxic Respiratory Failure  history of COPD  - could be related to COPD (though not chronically on O2), small R effusion on CT scan could represent pna - follow CXR in AM - wean as tolerated, w/u further as indicated   Acute encephalopathy: Improved today, still some delirium/confusion - but appears he's much less lethargic than yesterday -Check ammonia (wnl) -Minimize sedating medications, hold narcotics, Robaxin - delirium  precautions - will allow family to stay at bedside overnight   Hyponatremia: Continue gentle IVF   S/p L2-L4 decompression/fusion 09/18/2020: Per neurosurgery.   CKD stage IIIb: Creatinine 2.46 at presentation Baseline unclear, as low as 1.58 here Recently worsened to 2.08 Renal US, UA Continue gentle IVF    Type 2 diabetes: Hold home glipizide, Jardiance, Victoza and reduce Levemir  Adjust insulin as needed, continue SSI    CAD: Plavix has been held in the perioperative period.  Continue Toprol-XL and Imdur as BP allows.   Iron deficiency anemia: S/p 2 unit PRBC for acute blood loss anemia (9/2 and 9/3)   Hypertension: Continue Toprol-XL and Imdur as BP allows.   OSA: Continue CPAP nightly.  DVT prophylaxis: SCD Code Status: full  Family Communication: son and wife at bedside Disposition:   Per [place]       Consultants:  [facility]   Procedures: PROCEDURE: 1. L2, L3 laminectomy with facetectomy for decompression of exiting nerve roots, more than would be required for placement of interbody graft 2. Placement of anterior interbody device - Medtronic epandable 22m cage x4 3. Posterior segmental instrumentation using Medtronic Solera pedicle screws and previously place Stryker D90 screws, L2 - L5 4. Interbody arthrodesis, L2-3, L3-4 5. Use of locally harvested bone autograft 6. Use of non-structural bone allograft - ProteOs 7. Removal of previous rod, L4-5  Antimicrobials: Anti-infectives (From admission, onward)    Start     Dose/Rate Route Frequency Ordered Stop   09/28/20 0200  vancomycin (VANCOREADY) IVPB 1500 mg/300 mL  1,500 mg 150 mL/hr over 120 Minutes Intravenous Every 48 hours 09/26/20 0035     09/26/20 1400  ceFEPIme (MAXIPIME) 2 g in sodium chloride 0.9 % 100 mL IVPB        2 g 200 mL/hr over 30 Minutes Intravenous Every 12 hours 09/26/20 0035     09/26/20 0130  vancomycin (VANCOREADY) IVPB 2000 mg/400 mL        2,000 mg 200 mL/hr  over 120 Minutes Intravenous STAT 09/26/20 0035 09/26/20 0854   09/26/20 0100  ceFEPIme (MAXIPIME) 2 g in sodium chloride 0.9 % 100 mL IVPB        2 g 200 mL/hr over 30 Minutes Intravenous STAT 09/26/20 0035 09/26/20 0852   09/18/20 2300  ceFAZolin (ANCEF) IVPB 2g/100 mL premix        2 g 200 mL/hr over 30 Minutes Intravenous Every 8 hours 09/18/20 1852 09/19/20 0633   09/18/20 0930  ceFAZolin (ANCEF) IVPB 2g/100 mL premix        2 g 200 mL/hr over 30 Minutes Intravenous On call to O.R. 09/18/20 0925 09/18/20 1520          Subjective: No specific complaints today  Objective: Vitals:   09/26/20 0340 09/26/20 0702 09/26/20 1101 09/26/20 1524  BP: (!) 101/56 116/63 113/68 110/63  Pulse: 96 84 92 90  Resp: '19 17 19 18  '$ Temp: 99 F (37.2 C) 98.5 F (36.9 C) 97.6 F (36.4 C) 98.7 F (37.1 C)  TempSrc: Oral Oral Oral Oral  SpO2: 91% 96% 95% 94%  Weight:      Height:        Intake/Output Summary (Last 24 hours) at 09/26/2020 1651 Last data filed at 09/26/2020 1200 Gross per 24 hour  Intake 800 ml  Output 400 ml  Net 400 ml   Filed Weights   09/18/20 0928  Weight: 98.2 kg    Examination:  General: No acute distress. Cardiovascular: Heart sounds show [name] regular rate, and rhythm.  Lungs: Clear to auscultation bilaterally, requiring 3-4 L [place] Abdomen: Soft, nontender, nondistended Neurological: Alert and oriented 3. Moves all extremities 4 . Cranial nerves II through XII grossly intact. Skin: dressing to lumbar region noted, appears clean dry and intact Extremities: No clubbing or cyanosis. No edema.      Data Reviewed: I have personally reviewed following labs and imaging studies  CBC: Recent Labs  Lab 09/23/20 0057 09/24/20 0450 09/25/20 0243 09/25/20 2146 09/26/20 0201  WBC 7.4 6.1 7.6 17.6* 13.5*  NEUTROABS  --   --   --  15.7*  --   HGB 8.9* 9.5* 9.4* 9.0* 8.7*  HCT 27.1* 29.3* 29.2* 28.6* 28.0*  MCV 93.8 94.5 94.5 96.9 97.6  PLT 158 189 192 206 201     Basic Metabolic Panel: Recent Labs  Lab 09/22/20 0726 09/23/20 0057 09/24/20 0450 09/25/20 0243 09/26/20 0201 09/26/20 1151  NA 132* 130* 131* 130* 130*  --   K 4.6 4.8 4.0 4.6 5.5* 4.8  CL 100 99 97* 95* 94*  --   CO2 '25 22 26 26 26  '$ --   GLUCOSE 123* 154* 99 115* 176*  --   BUN 33* 33* 33* 36* 41*  --   CREATININE 1.58* 1.65* 1.79* 1.87* 2.08*  --   CALCIUM 8.6* 8.5* 8.9 8.7* 8.6*  --     GFR: Estimated Creatinine Clearance: 31.1 mL/min (Jakeem Grape) (by C-G formula based on SCr of 2.08 mg/dL (H)).  Liver Function Tests: Recent Labs  Lab 09/21/20 1120  AST 57*  ALT 10  ALKPHOS 51  BILITOT 0.7  PROT 6.0*  ALBUMIN 2.6*    CBG: Recent Labs  Lab 09/25/20 1254 09/25/20 1606 09/26/20 0732 09/26/20 1114 09/26/20 1521  GLUCAP 88 91 153* 123* 193*     Recent Results (from the past 240 hour(s))  Resp Panel by RT-PCR (Flu Jourdan Maldonado&B, Covid) Nasopharyngeal Swab     Status: None   Collection Time: 09/25/20 12:44 PM   Specimen: Nasopharyngeal Swab; Nasopharyngeal(NP) swabs in vial transport medium  Result Value Ref Range Status   SARS Coronavirus 2 by RT PCR NEGATIVE NEGATIVE Final    Comment: (NOTE) SARS-CoV-2 target nucleic acids are NOT DETECTED.  The SARS-CoV-2 RNA is generally detectable in upper respiratory specimens during the acute phase of infection. The lowest concentration of SARS-CoV-2 viral copies this assay can detect is 138 copies/mL. Mckay Brandt negative result does not preclude SARS-Cov-2 infection and should not be used as the sole basis for treatment or other patient management decisions. Oziah Vitanza negative result may occur with  improper specimen collection/handling, submission of specimen other than nasopharyngeal swab, presence of viral mutation(s) within the areas targeted by this assay, and inadequate number of viral copies(<138 copies/mL). Carneshia Raker negative result must be combined with clinical observations, patient history, and epidemiological information. The expected  result is Negative.  Fact Sheet for Patients:  EntrepreneurPulse.com.au  Fact Sheet for Healthcare Providers:  IncredibleEmployment.be  This test is no t yet approved or cleared by the [place] FDA and  has been authorized for detection and/or diagnosis of SARS-CoV-2 by FDA under an Emergency Use Authorization (EUA). This EUA will remain  in effect (meaning this test can be used) for the duration of the COVID-19 declaration under Section 564(b)(1) of the Act, 21 U.S.C.section 360bbb-3(b)(1), unless the authorization is terminated  or revoked sooner.       Influenza Burlon Centrella by PCR NEGATIVE NEGATIVE Final   Influenza B by PCR NEGATIVE NEGATIVE Final    Comment: (NOTE) The Xpert Xpress SARS-CoV-2/FLU/RSV plus assay is intended as an aid in the diagnosis of influenza from Nasopharyngeal swab specimens and should not be used as Bowyn Mercier sole basis for treatment. Nasal washings and aspirates are unacceptable for Xpert Xpress SARS-CoV-2/FLU/RSV testing.  Fact Sheet for Patients: EntrepreneurPulse.com.au  Fact Sheet for Healthcare Providers: IncredibleEmployment.be  This test is not yet approved or cleared by the [place] FDA and has been authorized for detection and/or diagnosis of SARS-CoV-2 by FDA under an Emergency Use Authorization (EUA). This EUA will remain in effect (meaning this test can be used) for the duration of the COVID-19 declaration under Section 564(b)(1) of the Act, 21 U.S.C. section 360bbb-3(b)(1), unless the authorization is terminated or revoked.  Performed at [facility], [place] [address] [place], [place] [address]   Culture, blood (routine x 2)     Status: None (Preliminary result)   Collection Time: 09/25/20  9:46 PM   Specimen: BLOOD LEFT HAND  Result Value Ref Range Status   Specimen Description BLOOD LEFT HAND  Final   Special Requests AEROBIC BOTTLE ONLY Blood Culture  adequate volume  Final   Culture   Final    NO GROWTH < 12 HOURS Performed at [facility], [place] [address], [place], [place] [address]    Report Status PENDING  Incomplete  Culture, blood (routine x 2)     Status: None (Preliminary result)   Collection Time: 09/25/20  9:46 PM   Specimen: BLOOD  RIGHT HAND  Result Value Ref Range Status   Specimen Description BLOOD RIGHT HAND  Final   Special Requests AEROBIC BOTTLE ONLY Blood Culture adequate volume  Final   Culture   Final    NO GROWTH < 12 HOURS Performed at [facility], [address], [place], [place] [address]    Report Status PENDING  Incomplete         Radiology Studies: CT Angio Chest Pulmonary Embolism (PE) W or WO Contrast  Result Date: 09/25/2020 CLINICAL DATA:  PE suspected, high prob EXAM: CT ANGIOGRAPHY CHEST WITH CONTRAST TECHNIQUE: Multidetector CT imaging of the chest was performed using the standard protocol during bolus administration of intravenous contrast. Multiplanar CT image reconstructions and MIPs were obtained to evaluate the vascular anatomy. CONTRAST:  65m OMNIPAQUE IOHEXOL 350 MG/ML SOLN COMPARISON:  10/15/2014 FINDINGS: Cardiovascular: The pulmonary arteries are obscured in the lung bases due to respiratory motion. No visible suspicious filling defects to suggest pulmonary emboli. Heart is normal size. Aorta is normal caliber. Prior CABG. Aortic calcifications. Mediastinum/Nodes: No mediastinal, hilar, or axillary adenopathy. Trachea and esophagus are unremarkable. Thyroid unremarkable. Lungs/Pleura: Small right pleural effusion. Right base compressive atelectasis and minimal left base atelectasis. No effusions on the left. Upper Abdomen: Imaging into the upper abdomen demonstrates no acute findings. Musculoskeletal: Chest wall soft tissues are unremarkable. No acute bony abnormality. Review of the MIP images confirms the above findings. IMPRESSION: No visible pulmonary embolus. Pulmonary arteries  obscured in the lung bases due to respiratory motion. Small right pleural effusion.  Bibasilar atelectasis. Aortic Atherosclerosis (ICD10-I70.0). Electronically Signed   By: KRolm BaptiseM.D.   On: 09/25/2020 19:01        Scheduled Meds:  buPROPion  150 mg Oral q morning   Chlorhexidine Gluconate Cloth  6 each Topical Daily   docusate sodium  100 mg Oral BID   DULoxetine  60 mg Oral Daily   feeding supplement (GLUCERNA SHAKE)  237 mL Oral TID BM   gabapentin  300 mg Oral TID   insulin aspart  0-20 Units Subcutaneous TID WC   insulin detemir  20 Units Subcutaneous Q2200   isosorbide mononitrate  30 mg Oral Daily   ketorolac  1 drop Right Eye Daily   magnesium oxide  400 mg Oral BID   metoprolol succinate  50 mg Oral Daily   multivitamin with minerals  1 tablet Oral Daily   omega-3 acid ethyl esters  1 g Oral Daily   pantoprazole  40 mg Oral QHS   polyethylene glycol  17 g Oral Daily   rosuvastatin  20 mg Oral Q2200   senna  1 tablet Oral BID   tamsulosin  0.4 mg Oral Daily   vitamin B-12  1,000 mcg Oral Daily   Continuous Infusions:  ceFEPime (MAXIPIME) IV 2 g (09/26/20 1452)   [START ON 09/28/2020] vancomycin       LOS: 8 days    Time spent: over 30 min    CFayrene Helper MD [facility]   To contact the attending provider between 7A-7P or the covering provider during after hours 7P-7A, please log into the web site [email or web address] and access using universal [place] password for that web site. If you do not have the password, please call the hospital operator.  09/26/2020, 4:51 PM

## 2020-09-26 NOTE — Progress Notes (Signed)
Pharmacy Antibiotic Note  Ricky Nelson is a 85 y.o. male admitted on 09/18/2020 s/p lumbar fusion. Now with sepsis.  Pharmacy has been consulted for Vancomycin and Cefepime dosing.  Plan: Cefepime 2gm IV q12h Vancomycin '2000mg'$  IV now then 1500 mg IV Q 48 hrs. Goal AUC 400-550. Expected AUC: 522. SCr used: 1.87, Vd coeff 0.5 Will f/u renal function, micro data, and pt's clinical condition Vanc levels prn   Height: '5\' 9"'$  (175.3 cm) Weight: 98.2 kg (216 lb 6.4 oz) IBW/kg (Calculated) : 70.7  Temp (24hrs), Avg:100.7 F (38.2 C), Min:98.3 F (36.8 C), Max:103.7 F (39.8 C)  Recent Labs  Lab 09/21/20 1120 09/22/20 0726 09/23/20 0057 09/24/20 0450 09/25/20 0243 09/25/20 2146  WBC 7.7 8.3 7.4 6.1 7.6 17.6*  CREATININE 1.82* 1.58* 1.65* 1.79* 1.87*  --     Estimated Creatinine Clearance: 34.6 mL/min (A) (by C-G formula based on SCr of 1.87 mg/dL (H)).    Allergies  Allergen Reactions   Allergy Relief [Chlorpheniramine Maleate] Shortness Of Breath   Diazepam     Other reaction(s): Hypotension, Low blood pressure, Other (See Comments)   Diphenhydramine Hcl Shortness Of Breath    Other reaction(s): Other (See Comments), Stopped breathing Other reaction(s): Shortness Of Breath Shortness of breath with benadryl    Diphenhydramine-Zinc Acetate Shortness Of Breath   Atorvastatin Other (See Comments)    Other reaction(s): Muscle pain, Other (See Comments), Unknown Elevated CK      Fluvastatin Other (See Comments)    Other reaction(s): Other (See Comments) myalgias   Lisinopril     Other reaction(s): Hyperkalemia, Other (See Comments)   Metformin And Related     Other reaction(s): Other Renal function   Piperacillin-Tazobactam In Dex Rash    Other reaction(s): Rash Other reaction(s): Rash    Diphenhydramine     Other reaction(s): Low blood pressure   Other Other (See Comments)    Lescol    Antimicrobials this admission: 9/7 Vanc >>  9/7 Cefepime >>     Microbiology results: 9/6 BCx:  UCx:     Thank you for allowing pharmacy to be a part of this patient's care.  Sherlon Handing, PharmD, BCPS Please see amion for complete clinical pharmacist phone list 09/26/2020 12:27 AM

## 2020-09-27 ENCOUNTER — Inpatient Hospital Stay (HOSPITAL_COMMUNITY): Payer: Medicare Other

## 2020-09-27 DIAGNOSIS — E119 Type 2 diabetes mellitus without complications: Secondary | ICD-10-CM | POA: Diagnosis not present

## 2020-09-27 DIAGNOSIS — G4730 Sleep apnea, unspecified: Secondary | ICD-10-CM

## 2020-09-27 DIAGNOSIS — R6510 Systemic inflammatory response syndrome (SIRS) of non-infectious origin without acute organ dysfunction: Secondary | ICD-10-CM | POA: Diagnosis not present

## 2020-09-27 LAB — CBC
HCT: 25.1 % — ABNORMAL LOW (ref 39.0–52.0)
Hemoglobin: 8 g/dL — ABNORMAL LOW (ref 13.0–17.0)
MCH: 30.3 pg (ref 26.0–34.0)
MCHC: 31.9 g/dL (ref 30.0–36.0)
MCV: 95.1 fL (ref 80.0–100.0)
Platelets: 171 10*3/uL (ref 150–400)
RBC: 2.64 MIL/uL — ABNORMAL LOW (ref 4.22–5.81)
RDW: 14.7 % (ref 11.5–15.5)
WBC: 6.5 10*3/uL (ref 4.0–10.5)
nRBC: 0 % (ref 0.0–0.2)

## 2020-09-27 LAB — BLOOD CULTURE ID PANEL (REFLEXED) - BCID2

## 2020-09-27 LAB — URINE CULTURE: Culture: NO GROWTH

## 2020-09-27 LAB — HEPATIC FUNCTION PANEL
ALT: 41 U/L (ref 0–44)
AST: 80 U/L — ABNORMAL HIGH (ref 15–41)
Albumin: 2.2 g/dL — ABNORMAL LOW (ref 3.5–5.0)
Alkaline Phosphatase: 68 U/L (ref 38–126)
Bilirubin, Direct: 0.2 mg/dL (ref 0.0–0.2)
Indirect Bilirubin: 0.2 mg/dL — ABNORMAL LOW (ref 0.3–0.9)
Total Bilirubin: 0.4 mg/dL (ref 0.3–1.2)
Total Protein: 6.4 g/dL — ABNORMAL LOW (ref 6.5–8.1)

## 2020-09-27 LAB — GLUCOSE, CAPILLARY
Glucose-Capillary: 156 mg/dL — ABNORMAL HIGH (ref 70–99)
Glucose-Capillary: 239 mg/dL — ABNORMAL HIGH (ref 70–99)
Glucose-Capillary: 130 mg/dL — ABNORMAL HIGH (ref 70–99)
Glucose-Capillary: 150 mg/dL — ABNORMAL HIGH (ref 70–99)

## 2020-09-27 LAB — DIFFERENTIAL
Abs Immature Granulocytes: 0.03 10*3/uL (ref 0.00–0.07)
Basophils Absolute: 0 10*3/uL (ref 0.0–0.1)
Basophils Relative: 0 %
Eosinophils Absolute: 0.1 10*3/uL (ref 0.0–0.5)
Eosinophils Relative: 2 %
Immature Granulocytes: 1 %
Lymphocytes Relative: 8 %
Lymphs Abs: 0.5 10*3/uL — ABNORMAL LOW (ref 0.7–4.0)
Monocytes Absolute: 0.8 10*3/uL (ref 0.1–1.0)
Monocytes Relative: 13 %
Neutro Abs: 5 10*3/uL (ref 1.7–7.7)
Neutrophils Relative %: 76 %

## 2020-09-27 LAB — BASIC METABOLIC PANEL
Anion gap: 10 (ref 5–15)
BUN: 37 mg/dL — ABNORMAL HIGH (ref 8–23)
CO2: 26 mmol/L (ref 22–32)
Calcium: 9.1 mg/dL (ref 8.9–10.3)
Chloride: 93 mmol/L — ABNORMAL LOW (ref 98–111)
Creatinine, Ser: 1.84 mg/dL — ABNORMAL HIGH (ref 0.61–1.24)
GFR, Estimated: 36 mL/min — ABNORMAL LOW (ref >60)
Glucose, Bld: 154 mg/dL — ABNORMAL HIGH (ref 70–99)
Potassium: 4.4 mmol/L (ref 3.5–5.1)
Sodium: 129 mmol/L — ABNORMAL LOW (ref 135–145)

## 2020-09-27 LAB — MAGNESIUM: Magnesium: 2.2 mg/dL (ref 1.7–2.4)

## 2020-09-27 LAB — PHOSPHORUS: Phosphorus: 4.2 mg/dL (ref 2.5–4.6)

## 2020-09-27 LAB — SODIUM, URINE, RANDOM: Sodium, Ur: 83 mmol/L

## 2020-09-27 LAB — OSMOLALITY: Osmolality: 294 mOsm/kg (ref 275–295)

## 2020-09-27 NOTE — Progress Notes (Addendum)
PHARMACY - PHYSICIAN COMMUNICATION CRITICAL VALUE ALERT - BLOOD CULTURE IDENTIFICATION (BCID)  Ricky Nelson is an 85 y.o. male who presented to [facility] on 09/18/2020 with a chief complaint of worsening leg pain needing surgical decompression and fusion.     Assessment:  85yom s/p spinal surgery now growing serratia in 1/3 bottles from blood cultures.   Name of physician (or Provider) Contacted: Olena Heckle NP  Current antibiotics: vancomycin and cefepime  Changes to prescribed antibiotics recommended:  Recommendations declined by provider - infection may be polymicrobial, will continue broad antibiotics overnight and discuss with day hospitalist team and neurosurgery in am.   Results for orders placed or performed during the hospital encounter of 09/18/20  Blood Culture ID Panel (Reflexed) (Collected: 09/25/2020  9:46 PM)  Result Value Ref Range   Enterococcus faecalis NOT DETECTED NOT DETECTED   Enterococcus Faecium NOT DETECTED NOT DETECTED   Listeria monocytogenes NOT DETECTED NOT DETECTED   Staphylococcus species NOT DETECTED NOT DETECTED   Staphylococcus aureus (BCID) NOT DETECTED NOT DETECTED   Staphylococcus epidermidis NOT DETECTED NOT DETECTED   Staphylococcus lugdunensis NOT DETECTED NOT DETECTED   Streptococcus species NOT DETECTED NOT DETECTED   Streptococcus agalactiae NOT DETECTED NOT DETECTED   Streptococcus pneumoniae NOT DETECTED NOT DETECTED   Streptococcus pyogenes NOT DETECTED NOT DETECTED   A.calcoaceticus-baumannii NOT DETECTED NOT DETECTED   Bacteroides fragilis NOT DETECTED NOT DETECTED   Enterobacterales DETECTED (A) NOT DETECTED   Enterobacter cloacae complex NOT DETECTED NOT DETECTED   Escherichia coli NOT DETECTED NOT DETECTED   Klebsiella aerogenes NOT DETECTED NOT DETECTED   Klebsiella oxytoca NOT DETECTED NOT DETECTED   Klebsiella pneumoniae NOT DETECTED NOT DETECTED   Proteus species NOT DETECTED NOT DETECTED   Salmonella species NOT DETECTED NOT  DETECTED   Serratia marcescens DETECTED (A) NOT DETECTED   Haemophilus influenzae NOT DETECTED NOT DETECTED   Neisseria meningitidis NOT DETECTED NOT DETECTED   Pseudomonas aeruginosa NOT DETECTED NOT DETECTED   Stenotrophomonas maltophilia NOT DETECTED NOT DETECTED   Candida albicans NOT DETECTED NOT DETECTED   Candida auris NOT DETECTED NOT DETECTED   Candida glabrata NOT DETECTED NOT DETECTED   Candida krusei NOT DETECTED NOT DETECTED   Candida parapsilosis NOT DETECTED NOT DETECTED   Candida tropicalis NOT DETECTED NOT DETECTED   Cryptococcus neoformans/gattii NOT DETECTED NOT DETECTED   CTX-M ESBL NOT DETECTED NOT DETECTED   Carbapenem resistance IMP NOT DETECTED NOT DETECTED   Carbapenem resistance KPC NOT DETECTED NOT DETECTED   Carbapenem resistance NDM NOT DETECTED NOT DETECTED   Carbapenem resist OXA 48 LIKE NOT DETECTED NOT DETECTED   Carbapenem resistance VIM NOT DETECTED NOT DETECTED    Erin Hearing PharmD., BCPS Clinical Pharmacist 09/27/2020 2:27 AM

## 2020-09-27 NOTE — Consult Note (Signed)
[facility] for Infectious Disease    Date of Admission:  09/18/2020   Total days of antibiotics 2        Cefepime        Vancomycin > DC  Reason for Consult: Serratia bacteremia    Referring Provider: Dr. Florene Glen   Assessment: Patient with lumbar spine stenosis status post spinal fusion on 8/30 now developed Serratia bacteremia.  Unclear source of infection at this time.  The surgical wound appears clean and noninfected.  Lower suspicion for deep infection per physical exam.    He has a urine culture pending without a UA.  Culture was obtained after 1 dose of cefepime.  If urine culture grew Serratia, it would confirm the source.  Also suspicious for respiratory source with dyspnea, sore throat and coughing.  Chest x-ray showed bilateral pleural effusion without consolidation.  Will discontinue vancomycin and continue cefepime.  If he is not clinically improved or back pain gets worse in the next few days, will consider obtaining imaging of his lower back.  Plan: Discontinue vancomycin Continue cefepime Pending urine culture Encourage patient to use spirometry or flutter valve to help with lung expansion Will continue to follow  Active Problems:   Lumbar spinal stenosis   Sleep apnea   Diabetes mellitus without complication (HCC)   Coronary artery disease   CKD (chronic kidney disease)   SIRS (systemic inflammatory response syndrome) (HCC)   Scheduled Meds:  buPROPion  150 mg Oral q morning   Chlorhexidine Gluconate Cloth  6 each Topical Daily   docusate sodium  100 mg Oral BID   DULoxetine  60 mg Oral Daily   feeding supplement (GLUCERNA SHAKE)  237 mL Oral TID BM   gabapentin  300 mg Oral TID   insulin aspart  0-20 Units Subcutaneous TID WC   insulin detemir  20 Units Subcutaneous Q2200   isosorbide mononitrate  30 mg Oral Daily   ketorolac  1 drop Right Eye Daily   magnesium oxide  400 mg Oral BID   metoprolol succinate  50 mg Oral Daily    multivitamin with minerals  1 tablet Oral Daily   omega-3 acid ethyl esters  1 g Oral Daily   pantoprazole  40 mg Oral QHS   polyethylene glycol  17 g Oral Daily   rosuvastatin  20 mg Oral Q2200   senna  1 tablet Oral BID   tamsulosin  0.4 mg Oral Daily   vitamin B-12  1,000 mcg Oral Daily   Continuous Infusions:  sodium chloride     ceFEPime (MAXIPIME) IV 2 g (09/27/20 0204)   [START ON 09/28/2020] vancomycin     PRN Meds:.acetaminophen **OR** acetaminophen, bisacodyl, fluticasone, ipratropium-albuterol, menthol-cetylpyridinium **OR** phenol, nitroGLYCERIN, ondansetron **OR** ondansetron (ZOFRAN) IV, senna-docusate, triamcinolone cream  HPI: Ricky Nelson is a 85 y.o. male with past medical history of type 2 diabetes, CKD, who presented to the hospital for bilateral lower extremity weakness found to have lumbar spine stenosis.  He underwent lumbar spine fusion surgery on 8/30 with hardware placement.  Patient then spiked a fever on 9/6 with T-max of 103.  Blood culture on 9/6 grew Serratia bacteremia.  Patient is seen at bedside with his wife.  He endorses back pain and bilateral leg pain.  States that pain is not better after surgery.  His wife reports an event yesterday when his leg gave out and he fell.  Patient denies suprapubic tenderness.  He has  a Foley catheter in place.  He also endorses ongoing shortness of breath with cough that happened before the surgery and not getting better.  Also endorses sore throat.   Review of Systems: ROS Per HPI  Past Medical History:  Diagnosis Date   Anemia    Anginal pain ([place])    2018; chronic stable angina 06/2020 (Dr. Atilano Median)   Cancer [facility])    bladder   CKD (chronic kidney disease)    Coronary artery disease    Diabetes mellitus without complication ([place])    type 2   Hypertension    Myocardial infarction ([place]) 1995   Pneumonia 1995   Sleep apnea 2012    Social History   Tobacco Use   Smoking status: Never   Smokeless tobacco:  Never  Vaping Use   Vaping Use: Never used  Substance Use Topics   Alcohol use: Not Currently   Drug use: Never    History reviewed. No pertinent family history. Allergies  Allergen Reactions   Allergy Relief [Chlorpheniramine Maleate] Shortness Of Breath   Diazepam     Other reaction(s): Hypotension, Low blood pressure, Other (See Comments)   Diphenhydramine Hcl Shortness Of Breath    Other reaction(s): Other (See Comments), Stopped breathing Other reaction(s): Shortness Of Breath Shortness of breath with benadryl    Diphenhydramine-Zinc Acetate Shortness Of Breath   Atorvastatin Other (See Comments)    Other reaction(s): Muscle pain, Other (See Comments), Unknown Elevated CK      Fluvastatin Other (See Comments)    Other reaction(s): Other (See Comments) myalgias   Lisinopril     Other reaction(s): Hyperkalemia, Other (See Comments)   Metformin And Related     Other reaction(s): Other Renal function   Piperacillin-Tazobactam In Dex Rash    Other reaction(s): Rash Other reaction(s): Rash    Diphenhydramine     Other reaction(s): Low blood pressure   Other Other (See Comments)    Lescol    OBJECTIVE: Blood pressure (!) 145/80, pulse 87, temperature 98.2 F (36.8 C), temperature source Oral, resp. rate 18, height '5\' 9"'$  (1.753 m), weight 98.2 kg, SpO2 99 %.  Physical Exam Constitutional:      General: He is not in acute distress. HENT:     Head: Normocephalic.  Eyes:     Conjunctiva/sclera: Conjunctivae normal.  Cardiovascular:     Rate and Rhythm: Normal rate and regular rhythm.  Pulmonary:     Effort: Pulmonary effort is normal. No respiratory distress.     Breath sounds: Normal breath sounds.     Comments: Crackles at the lower lung base.  Mild expiratory wheezing Abdominal:     General: Bowel sounds are normal. There is no distension.     Palpations: Abdomen is soft.     Tenderness: There is no abdominal tenderness.  Musculoskeletal:        General:  Normal range of motion.     Comments: Mild tenderness to palpation bilateral lower extremity.  No erythema skin rash noted.  Skin:    General: Skin is warm.     Comments: Lumbar surgical wound appears clean and noninfected.  No drainage or purulent discharge.  He has tenderness to palpation but not significant.  Neurological:     Mental Status: He is alert and oriented to person, place, and time.  Psychiatric:        Mood and Affect: Mood normal.        Behavior: Behavior normal.      Lab Results  Lab Results  Component Value Date   WBC 6.5 09/27/2020   HGB 8.0 (L) 09/27/2020   HCT 25.1 (L) 09/27/2020   MCV 95.1 09/27/2020   PLT 171 09/27/2020    Lab Results  Component Value Date   CREATININE 1.84 (H) 09/27/2020   BUN 37 (H) 09/27/2020   NA 129 (L) 09/27/2020   K 4.4 09/27/2020   CL 93 (L) 09/27/2020   CO2 26 09/27/2020    Lab Results  Component Value Date   ALT 41 09/27/2020   AST 80 (H) 09/27/2020   ALKPHOS 68 09/27/2020   BILITOT 0.4 09/27/2020     Microbiology: Recent Results (from the past 240 hour(s))  Resp Panel by RT-PCR (Flu A&B, Covid) Nasopharyngeal Swab     Status: None   Collection Time: 09/25/20 12:44 PM   Specimen: Nasopharyngeal Swab; Nasopharyngeal(NP) swabs in vial transport medium  Result Value Ref Range Status   SARS Coronavirus 2 by RT PCR NEGATIVE NEGATIVE Final    Comment: (NOTE) SARS-CoV-2 target nucleic acids are NOT DETECTED.  The SARS-CoV-2 RNA is generally detectable in upper respiratory specimens during the acute phase of infection. The lowest concentration of SARS-CoV-2 viral copies this assay can detect is 138 copies/mL. A negative result does not preclude SARS-Cov-2 infection and should not be used as the sole basis for treatment or other patient management decisions. A negative result may occur with  improper specimen collection/handling, submission of specimen other than nasopharyngeal swab, presence of viral mutation(s) within  the areas targeted by this assay, and inadequate number of viral copies(<138 copies/mL). A negative result must be combined with clinical observations, patient history, and epidemiological information. The expected result is Negative.  Fact Sheet for Patients:  EntrepreneurPulse.com.au  Fact Sheet for Healthcare Providers:  IncredibleEmployment.be  This test is no t yet approved or cleared by the [place] FDA and  has been authorized for detection and/or diagnosis of SARS-CoV-2 by FDA under an Emergency Use Authorization (EUA). This EUA will remain  in effect (meaning this test can be used) for the duration of the COVID-19 declaration under Section 564(b)(1) of the Act, 21 U.S.C.section 360bbb-3(b)(1), unless the authorization is terminated  or revoked sooner.       Influenza A by PCR NEGATIVE NEGATIVE Final   Influenza B by PCR NEGATIVE NEGATIVE Final    Comment: (NOTE) The Xpert Xpress SARS-CoV-2/FLU/RSV plus assay is intended as an aid in the diagnosis of influenza from Nasopharyngeal swab specimens and should not be used as a sole basis for treatment. Nasal washings and aspirates are unacceptable for Xpert Xpress SARS-CoV-2/FLU/RSV testing.  Fact Sheet for Patients: EntrepreneurPulse.com.au  Fact Sheet for Healthcare Providers: IncredibleEmployment.be  This test is not yet approved or cleared by the [place] FDA and has been authorized for detection and/or diagnosis of SARS-CoV-2 by FDA under an Emergency Use Authorization (EUA). This EUA will remain in effect (meaning this test can be used) for the duration of the COVID-19 declaration under Section 564(b)(1) of the Act, 21 U.S.C. section 360bbb-3(b)(1), unless the authorization is terminated or revoked.  Performed at [facility], [place] [address], [place], [place] [address]   Culture, blood (routine x 2)     Status: None  (Preliminary result)   Collection Time: 09/25/20  9:46 PM   Specimen: BLOOD LEFT HAND  Result Value Ref Range Status   Specimen Description BLOOD LEFT HAND  Final   Special Requests AEROBIC BOTTLE ONLY Blood Culture adequate volume  Final  Culture  Setup Time   Final    GRAM NEGATIVE RODS AEROBIC BOTTLE ONLY CRITICAL RESULT CALLED TO, READ BACK BY AND VERIFIED WITH: PHARMD FRANK WILSON 09/27/20'@00'$ :40 BY TW Performed at [facility], [place] [address], [place], [place] [address]    Culture GRAM NEGATIVE RODS  Final   Report Status PENDING  Incomplete  Culture, blood (routine x 2)     Status: None (Preliminary result)   Collection Time: 09/25/20  9:46 PM   Specimen: BLOOD RIGHT HAND  Result Value Ref Range Status   Specimen Description BLOOD RIGHT HAND  Final   Special Requests AEROBIC BOTTLE ONLY Blood Culture adequate volume  Final   Culture   Final    NO GROWTH 2 DAYS Performed at [facility], [place] [address], [place], [place] [address]    Report Status PENDING  Incomplete  Blood Culture ID Panel (Reflexed)     Status: Abnormal   Collection Time: 09/25/20  9:46 PM  Result Value Ref Range Status   Enterococcus faecalis NOT DETECTED NOT DETECTED Final   Enterococcus Faecium NOT DETECTED NOT DETECTED Final   Listeria monocytogenes NOT DETECTED NOT DETECTED Final   Staphylococcus species NOT DETECTED NOT DETECTED Final   Staphylococcus aureus (BCID) NOT DETECTED NOT DETECTED Final   Staphylococcus epidermidis NOT DETECTED NOT DETECTED Final   Staphylococcus lugdunensis NOT DETECTED NOT DETECTED Final   Streptococcus species NOT DETECTED NOT DETECTED Final   Streptococcus agalactiae NOT DETECTED NOT DETECTED Final   Streptococcus pneumoniae NOT DETECTED NOT DETECTED Final   Streptococcus pyogenes NOT DETECTED NOT DETECTED Final   A.calcoaceticus-baumannii NOT DETECTED NOT DETECTED Final   Bacteroides fragilis NOT DETECTED NOT DETECTED Final   Enterobacterales DETECTED  (A) NOT DETECTED Final    Comment: Enterobacterales represent a large order of gram negative bacteria, not a single organism. CRITICAL RESULT CALLED TO, READ BACK BY AND VERIFIED WITH: PHARMD FRANK WILSON 09/27/20'@00'$ :40 BY TW    Enterobacter cloacae complex NOT DETECTED NOT DETECTED Final   Escherichia coli NOT DETECTED NOT DETECTED Final   Klebsiella aerogenes NOT DETECTED NOT DETECTED Final   Klebsiella oxytoca NOT DETECTED NOT DETECTED Final   Klebsiella pneumoniae NOT DETECTED NOT DETECTED Final   Proteus species NOT DETECTED NOT DETECTED Final   Salmonella species NOT DETECTED NOT DETECTED Final   Serratia marcescens DETECTED (A) NOT DETECTED Final    Comment: CRITICAL RESULT CALLED TO, READ BACK BY AND VERIFIED WITH: PHARMD FRANK WILSON 09/27/20'@00'$ :40 BY TW    Haemophilus influenzae NOT DETECTED NOT DETECTED Final   Neisseria meningitidis NOT DETECTED NOT DETECTED Final   Pseudomonas aeruginosa NOT DETECTED NOT DETECTED Final   Stenotrophomonas maltophilia NOT DETECTED NOT DETECTED Final   Candida albicans NOT DETECTED NOT DETECTED Final   Candida auris NOT DETECTED NOT DETECTED Final   Candida glabrata NOT DETECTED NOT DETECTED Final   Candida krusei NOT DETECTED NOT DETECTED Final   Candida parapsilosis NOT DETECTED NOT DETECTED Final   Candida tropicalis NOT DETECTED NOT DETECTED Final   Cryptococcus neoformans/gattii NOT DETECTED NOT DETECTED Final   CTX-M ESBL NOT DETECTED NOT DETECTED Final   Carbapenem resistance IMP NOT DETECTED NOT DETECTED Final   Carbapenem resistance KPC NOT DETECTED NOT DETECTED Final   Carbapenem resistance NDM NOT DETECTED NOT DETECTED Final   Carbapenem resist OXA 48 LIKE NOT DETECTED NOT DETECTED Final   Carbapenem resistance VIM NOT DETECTED NOT DETECTED Final    Comment: Performed at [facility], [address], [place], [place] [address]    Gaylan Gerold, [place] [facility] [phone] pager    09/27/2020, 9:41 AM

## 2020-09-27 NOTE — Progress Notes (Signed)
Physical Therapy Treatment Patient Details Name: Ricky Nelson MRN: [id number] DOB: 05-01-1935 Today's Date: 09/27/2020    History of Present Illness Pt is an 85 y/o male admitted to [facility] on 8/30 for L2-4 fusion with extension of hardware L2-L5. PMH significant for HTN, DM2, Bladder cancer, MI in 1995, Sleep apnea.    PT Comments    Patient continues to be very confused, oriented to self and time. Difficult to reorient patient this session even with contextual clues. Patient requires min guard for transfers and short distance mobility. Patient continues to be limited by generalized weakness, impaired balance, and decreased activity tolerance. Patient on 3L O2 [place] during session with VSS. Continue to recommend SNF for ongoing Physical Therapy.       Follow Up Recommendations  SNF;Supervision/Assistance - 24 hour     Equipment Recommendations  Rolling Jisella Ashenfelter with 5" wheels    Recommendations for Other Services       Precautions / Restrictions Precautions Precautions: Back;Fall Precaution Booklet Issued: Yes (comment) Precaution Comments: Reviewed precautions verbally Required Braces or Orthoses: Spinal Brace Spinal Brace: Lumbar corset;Applied in sitting position Restrictions Weight Bearing Restrictions: No    Mobility  Bed Mobility Overal bed mobility: Needs Assistance Bed Mobility: Rolling;Sidelying to Sit;Sit to Sidelying Rolling: Supervision Sidelying to sit: Min assist     Sit to sidelying: Min assist General bed mobility comments: minA for trunk elevation and returning LEs into bed    Transfers Overall transfer level: Needs assistance Equipment used: Rolling Rayanne Padmanabhan (2 wheeled) Transfers: Sit to/from Stand Sit to Stand: Min guard         General transfer comment: min guard for safety, good recall of hand placement. sit to stand x 4 during session  Ambulation/Gait Ambulation/Gait assistance: Min guard Gait Distance (Feet): 8 Feet (x8', x8') Assistive  device: Rolling Dreydon Cardenas (2 wheeled) Gait Pattern/deviations: Step-to pattern;Decreased stride length;Trunk flexed Gait velocity: decreased   General Gait Details: min guard for safety with close chair follow due to incidence from last session. Cues for upright posture and RW proximity   Stairs             Wheelchair Mobility    Modified Rankin (Stroke Patients Only)       Balance Overall balance assessment: Needs assistance Sitting-balance support: Bilateral upper extremity supported Sitting balance-Leahy Scale: Fair     Standing balance support: Bilateral upper extremity supported;During functional activity Standing balance-Leahy Scale: Poor Standing balance comment: reliant on UE support and external assist                            Cognition Arousal/Alertness: Awake/alert Behavior During Therapy: Impulsive;Anxious;Restless Overall Cognitive Status: Impaired/Different from baseline Area of Impairment: Orientation;Memory;Following commands;Attention;Safety/judgement;Awareness;Problem solving                 Orientation Level: Disoriented to;Place;Situation Current Attention Level: Sustained Memory: Decreased recall of precautions;Decreased short-term memory Following Commands: Follows one step commands inconsistently Safety/Judgement: Decreased awareness of safety;Decreased awareness of deficits Awareness: Intellectual Problem Solving: Difficulty sequencing;Requires verbal cues;Requires tactile cues General Comments: impulsive and restless requiring wrist restraints this session. Difficult to reorient due to memory deficits. Oriented to self and time.      Exercises General Exercises - Lower Extremity Mini-Sqauts: Both;10 reps;Strengthening;Standing    General Comments        Pertinent Vitals/Pain Pain Assessment: Faces Faces Pain Scale: No hurt Pain Intervention(s): Monitored during session    Home Living  Prior Function            PT Goals (current goals can now be found in the care plan section) Acute Rehab PT Goals Patient Stated Goal: did not state PT Goal Formulation: With patient/family Time For Goal Achievement: 10/03/20 Potential to Achieve Goals: Good Progress towards PT goals: Progressing toward goals    Frequency    Min 5X/week      PT Plan Current plan remains appropriate    Co-evaluation              AM-PAC PT "6 Clicks" Mobility   Outcome Measure  Help needed turning from your back to your side while in a flat bed without using bedrails?: A Little Help needed moving from lying on your back to sitting on the side of a flat bed without using bedrails?: A Little Help needed moving to and from a bed to a chair (including a wheelchair)?: A Little Help needed standing up from a chair using your arms (e.g., wheelchair or bedside chair)?: A Little Help needed to walk in hospital room?: A Little Help needed climbing 3-5 steps with a railing? : Total 6 Click Score: 16    End of Session Equipment Utilized During Treatment: Gait belt;Back brace Activity Tolerance: Patient tolerated treatment well Patient left: in bed;with call bell/phone within reach;with bed alarm set;with restraints reapplied Nurse Communication: Mobility status PT Visit Diagnosis: Unsteadiness on feet (R26.81);Pain     Time: [id number] PT Time Calculation (min) (ACUTE ONLY): 32 min  Charges:  $Gait Training: 23-37 mins                     Joscelynn Brutus A. [place] Rile PT, DPT Acute Rehabilitation Services Pager [phone] Office [phone]    Linna Hoff 09/27/2020, 5:27 PM

## 2020-09-27 NOTE — Progress Notes (Addendum)
Consult NOTE    KERIC Nelson  [id number] DOB: 1936-01-10 DOA: 09/18/2020 PCP: Pcp, No   No chief complaint on file.  Brief Narrative:  Ricky Nelson is Ricky Nelson 85 y.o. male with medical history significant for CAD, T2DM, CKD stage IIIb, HTN, OSA on CPAP who was admitted under the neurosurgery service after undergoing L2-L4 decompression/fusion on 09/18/2020.  Patient had new fever developing 9/6  with transient episodes of tachycardia and hypotension.  No clear infectious source.  Surgical site appears clean.  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  Blood cultures and urine cultures were ordered but still pending collection.  Assessment & Plan:   Active Problems:   Lumbar spinal stenosis   Sleep apnea   Diabetes mellitus without complication (HCC)   Coronary artery disease   CKD (chronic kidney disease)   SIRS (systemic inflammatory response syndrome) ([place])  Sepsis 2/2 Serratia Marcescens Bacteremia: Fever with T-max 103.7 F rectally on 9/6. Also with reported rigors. associated tachycardia, tachypnea, transient hypotension Now with 1/2 blood cultures showing serratia Source not clear -> urinary vs pulmonary - post op site without si/sx infection per nsgy  Urine cx pending (after abx) - UA is pending CXR notable for mild interstitial edema and R pleural effusion COVID and influenza negative.   Narrow to cefepime for now ID c/s, appreciate recommendations  Acute Hypoxic Respiratory Failure  History of COPD  Mild Pulmonary Edema - could be related to COPD (though not chronically on O2), small R effusion on CT scan could represent pna - currently on 3 L - CXR with cardiomegaly and mild pulm edema with small R effusion - Stop additional fluids, interstitial edema could represent HF - follow echo - strict I/O, daily weights - hold additional IVF for now, consider lasix (he's net negative 9 L at this time) - wean as tolerated, w/u further as indicated - SLP eval   Acute  encephalopathy: Persistent today Likely 2/2 above ammonia (wnl).  Follow B12, folate, TSH.   Minimize sedating medications, hold narcotics, Robaxin.  Hold gabapentin for now. delirium precautions - will allow family to stay at bedside overnight   Hyponatremia: With Ricky Nelson normal serum osm, will continue to monitor Urine sodium 83   S/p L2-L4 decompression/fusion 09/18/2020: Per neurosurgery.   CKD stage IIIb: Creatinine 2.46 at presentation Baseline unclear, as low as 1.58 here Fluctuating and now 1.84 Renal US (without evidence hydro - ? Bladder diverticulum), UA (pending) Continue gentle IVF    Type 2 diabetes: Hold home glipizide, Jardiance, Victoza and reduce Levemir  Adjust insulin as needed, continue SSI    CAD: Plavix has been held in the perioperative period.  Continue Toprol-XL and Imdur as BP allows.   Iron deficiency anemia: S/p 2 unit PRBC for acute blood loss anemia (9/2 and 9/3)   Hypertension: Continue Toprol-XL and Imdur as BP allows.   OSA: Continue CPAP nightly.  Bladder Diverticulum Needs follow up with dedicated CT or [place] as allowed  DVT prophylaxis: SCD Code Status: full  Family Communication: wife at bedside Disposition:   Per [name]       Consultants:  [employer] [place] [facility]   Procedures: PROCEDURE: 1. L2, L3 laminectomy with facetectomy for decompression of exiting nerve roots, more than would be required for placement of interbody graft 2. Placement of anterior interbody device - Medtronic epandable 76m cage x4 3. Posterior segmental instrumentation using Medtronic Solera pedicle screws and previously place Stryker D90 screws, L2 - L5 4. Interbody arthrodesis, L2-3, L3-4 5. Use of locally harvested  bone autograft 6. Use of non-structural bone allograft - ProteOs 7. Removal of previous rod, L4-5  Antimicrobials: Anti-infectives (From admission, onward)    Start     Dose/Rate Route Frequency Ordered Stop   09/28/20 0200  vancomycin  (VANCOREADY) IVPB 1500 mg/300 mL  Status:  Discontinued        1,500 mg 150 mL/hr over 120 Minutes Intravenous Every 48 hours 09/26/20 0035 09/27/20 1106   09/26/20 1400  ceFEPIme (MAXIPIME) 2 g in sodium chloride 0.9 % 100 mL IVPB        2 g 200 mL/hr over 30 Minutes Intravenous Every 12 hours 09/26/20 0035     09/26/20 0130  vancomycin (VANCOREADY) IVPB 2000 mg/400 mL        2,000 mg 200 mL/hr over 120 Minutes Intravenous STAT 09/26/20 0035 09/26/20 0854   09/26/20 0100  ceFEPIme (MAXIPIME) 2 g in sodium chloride 0.9 % 100 mL IVPB        2 g 200 mL/hr over 30 Minutes Intravenous STAT 09/26/20 0035 09/26/20 0852   09/18/20 2300  ceFAZolin (ANCEF) IVPB 2g/100 mL premix        2 g 200 mL/hr over 30 Minutes Intravenous Every 8 hours 09/18/20 1852 09/19/20 0633   09/18/20 0930  ceFAZolin (ANCEF) IVPB 2g/100 mL premix        2 g 200 mL/hr over 30 Minutes Intravenous On call to O.R. 09/18/20 0925 09/18/20 1520          Subjective: Pleasantly confused - he's aware of his confusion  Objective: Vitals:   09/27/20 0314 09/27/20 0737 09/27/20 1124 09/27/20 1524  BP: 127/73 (!) 145/80 (!) 162/71 115/65  Pulse: 88 87 96 80  Resp: '16 18 14 16  '$ Temp: 99.1 F (37.3 C) 98.2 F (36.8 C) 97.9 F (36.6 C) 98 F (36.7 C)  TempSrc: Oral Oral Oral Oral  SpO2: 97% 99% 91% 100%  Weight:      Height:        Intake/Output Summary (Last 24 hours) at 09/27/2020 1741 Last data filed at 09/27/2020 1600 Gross per 24 hour  Intake 500 ml  Output 1600 ml  Net -1100 ml   Filed Weights   09/18/20 0928  Weight: 98.2 kg    Examination:  General: No acute distress. Cardiovascular: RRR Lungs: unlabored. Abdomen: Soft, nontender, nondistended Neurological: pleasantly confused. Moves all extremities 4 with equal strength. Cranial nerves II through XII grossly intact. Skin: Warm and dry. No rashes or lesions. Extremities: No clubbing or cyanosis. No edema    Data Reviewed: I have personally  reviewed following labs and imaging studies  CBC: Recent Labs  Lab 09/24/20 0450 09/25/20 0243 09/25/20 2146 09/26/20 0201 09/27/20 0352  WBC 6.1 7.6 17.6* 13.5* 6.5  NEUTROABS  --   --  15.7*  --  5.0  HGB 9.5* 9.4* 9.0* 8.7* 8.0*  HCT 29.3* 29.2* 28.6* 28.0* 25.1*  MCV 94.5 94.5 96.9 97.6 95.1  PLT 189 192 206 201 [id number]    Basic Metabolic Panel: Recent Labs  Lab 09/23/20 0057 09/24/20 0450 09/25/20 0243 09/26/20 0201 09/26/20 1151 09/27/20 0352  NA 130* 131* 130* 130*  --  129*  K 4.8 4.0 4.6 5.5* 4.8 4.4  CL 99 97* 95* 94*  --  93*  CO2 '22 26 26 26  '$ --  26  GLUCOSE 154* 99 115* 176*  --  154*  BUN 33* 33* 36* 41*  --  37*  CREATININE 1.65* 1.79* 1.87* 2.08*  --  1.84*  CALCIUM 8.5* 8.9 8.7* 8.6*  --  9.1  MG  --   --   --   --   --  2.2  PHOS  --   --   --   --   --  4.2    GFR: Estimated Creatinine Clearance: 35.2 mL/min (Jaykub Mackins) (by C-G formula based on SCr of 1.84 mg/dL (H)).  Liver Function Tests: Recent Labs  Lab 09/21/20 1120 09/27/20 0352  AST 57* 80*  ALT 10 41  ALKPHOS 51 68  BILITOT 0.7 0.4  PROT 6.0* 6.4*  ALBUMIN 2.6* 2.2*    CBG: Recent Labs  Lab 09/26/20 1521 09/26/20 2129 09/27/20 0736 09/27/20 1131 09/27/20 1549  GLUCAP 193* 189* 156* 239* 130*     Recent Results (from the past 240 hour(s))  Resp Panel by RT-PCR (Flu Iasiah Ozment&B, Covid) Nasopharyngeal Swab     Status: None   Collection Time: 09/25/20 12:44 PM   Specimen: Nasopharyngeal Swab; Nasopharyngeal(NP) swabs in vial transport medium  Result Value Ref Range Status   SARS Coronavirus 2 by RT PCR NEGATIVE NEGATIVE Final    Comment: (NOTE) SARS-CoV-2 target nucleic acids are NOT DETECTED.  The SARS-CoV-2 RNA is generally detectable in upper respiratory specimens during the acute phase of infection. The lowest concentration of SARS-CoV-2 viral copies this assay can detect is 138 copies/mL. Shanielle Correll negative result does not preclude SARS-Cov-2 infection and should not be used as the sole  basis for treatment or other patient management decisions. Brok Stocking negative result may occur with  improper specimen collection/handling, submission of specimen other than nasopharyngeal swab, presence of viral mutation(s) within the areas targeted by this assay, and inadequate number of viral copies(<138 copies/mL). Alyah Boehning negative result must be combined with clinical observations, patient history, and epidemiological information. The expected result is Negative.  Fact Sheet for Patients:  EntrepreneurPulse.com.au  Fact Sheet for Healthcare Providers:  IncredibleEmployment.be  This test is no t yet approved or cleared by the [place] FDA and  has been authorized for detection and/or diagnosis of SARS-CoV-2 by FDA under an Emergency Use Authorization (EUA). This EUA will remain  in effect (meaning this test can be used) for the duration of the COVID-19 declaration under Section 564(b)(1) of the Act, 21 U.S.C.section 360bbb-3(b)(1), unless the authorization is terminated  or revoked sooner.       Influenza Anneka Studer by PCR NEGATIVE NEGATIVE Final   Influenza B by PCR NEGATIVE NEGATIVE Final    Comment: (NOTE) The Xpert Xpress SARS-CoV-2/FLU/RSV plus assay is intended as an aid in the diagnosis of influenza from Nasopharyngeal swab specimens and should not be used as Lamont Glasscock sole basis for treatment. Nasal washings and aspirates are unacceptable for Xpert Xpress SARS-CoV-2/FLU/RSV testing.  Fact Sheet for Patients: EntrepreneurPulse.com.au  Fact Sheet for Healthcare Providers: IncredibleEmployment.be  This test is not yet approved or cleared by the [place] FDA and has been authorized for detection and/or diagnosis of SARS-CoV-2 by FDA under an Emergency Use Authorization (EUA). This EUA will remain in effect (meaning this test can be used) for the duration of the COVID-19 declaration under Section 564(b)(1) of the Act,  21 U.S.C. section 360bbb-3(b)(1), unless the authorization is terminated or revoked.  Performed at [facility], [place] [address], [place], [place] [address]   Culture, blood (routine x 2)     Status: Abnormal (Preliminary result)   Collection Time: 09/25/20  9:46 PM   Specimen: BLOOD LEFT HAND  Result Value Ref Range Status  Specimen Description BLOOD LEFT HAND  Final   Special Requests AEROBIC BOTTLE ONLY Blood Culture adequate volume  Final   Culture  Setup Time   Final    GRAM NEGATIVE RODS AEROBIC BOTTLE ONLY CRITICAL RESULT CALLED TO, READ BACK BY AND VERIFIED WITH: PHARMD FRANK WILSON 09/27/20'@00'$ :40 BY TW Performed at [facility], [place] [address] [place], [place] [address]    Culture SERRATIA MARCESCENS (Sahil Milner)  Final   Report Status PENDING  Incomplete  Culture, blood (routine x 2)     Status: None (Preliminary result)   Collection Time: 09/25/20  9:46 PM   Specimen: BLOOD RIGHT HAND  Result Value Ref Range Status   Specimen Description BLOOD RIGHT HAND  Final   Special Requests AEROBIC BOTTLE ONLY Blood Culture adequate volume  Final   Culture   Final    NO GROWTH 2 DAYS Performed at [facility], [place] [address] [place], [place] [address]    Report Status PENDING  Incomplete  Blood Culture ID Panel (Reflexed)     Status: Abnormal   Collection Time: 09/25/20  9:46 PM  Result Value Ref Range Status   Enterococcus faecalis NOT DETECTED NOT DETECTED Final   Enterococcus Faecium NOT DETECTED NOT DETECTED Final   Listeria monocytogenes NOT DETECTED NOT DETECTED Final   Staphylococcus species NOT DETECTED NOT DETECTED Final   Staphylococcus aureus (BCID) NOT DETECTED NOT DETECTED Final   Staphylococcus epidermidis NOT DETECTED NOT DETECTED Final   Staphylococcus lugdunensis NOT DETECTED NOT DETECTED Final   Streptococcus species NOT DETECTED NOT DETECTED Final   Streptococcus agalactiae NOT DETECTED NOT DETECTED Final   Streptococcus pneumoniae NOT DETECTED  NOT DETECTED Final   Streptococcus pyogenes NOT DETECTED NOT DETECTED Final   Dajuan Turnley.calcoaceticus-baumannii NOT DETECTED NOT DETECTED Final   Bacteroides fragilis NOT DETECTED NOT DETECTED Final   Enterobacterales DETECTED (Pawan Knechtel) NOT DETECTED Final    Comment: Enterobacterales represent Cleopatra Sardo large order of gram negative bacteria, not Johnanna Bakke single organism. CRITICAL RESULT CALLED TO, READ BACK BY AND VERIFIED WITH: PHARMD FRANK WILSON 09/27/20'@00'$ :40 BY TW    Enterobacter cloacae complex NOT DETECTED NOT DETECTED Final   Escherichia coli NOT DETECTED NOT DETECTED Final   Klebsiella aerogenes NOT DETECTED NOT DETECTED Final   Klebsiella oxytoca NOT DETECTED NOT DETECTED Final   Klebsiella pneumoniae NOT DETECTED NOT DETECTED Final   Proteus species NOT DETECTED NOT DETECTED Final   Salmonella species NOT DETECTED NOT DETECTED Final   Serratia marcescens DETECTED (Tryson Lumley) NOT DETECTED Final    Comment: CRITICAL RESULT CALLED TO, READ BACK BY AND VERIFIED WITH: PHARMD FRANK WILSON 09/27/20'@00'$ :40 BY TW    Haemophilus influenzae NOT DETECTED NOT DETECTED Final   Neisseria meningitidis NOT DETECTED NOT DETECTED Final   Pseudomonas aeruginosa NOT DETECTED NOT DETECTED Final   Stenotrophomonas maltophilia NOT DETECTED NOT DETECTED Final   Candida albicans NOT DETECTED NOT DETECTED Final   Candida auris NOT DETECTED NOT DETECTED Final   Candida glabrata NOT DETECTED NOT DETECTED Final   Candida krusei NOT DETECTED NOT DETECTED Final   Candida parapsilosis NOT DETECTED NOT DETECTED Final   Candida tropicalis NOT DETECTED NOT DETECTED Final   Cryptococcus neoformans/gattii NOT DETECTED NOT DETECTED Final   CTX-M ESBL NOT DETECTED NOT DETECTED Final   Carbapenem resistance IMP NOT DETECTED NOT DETECTED Final   Carbapenem resistance KPC NOT DETECTED NOT DETECTED Final   Carbapenem resistance NDM NOT DETECTED NOT DETECTED Final   Carbapenem resist OXA 48 LIKE NOT DETECTED  NOT DETECTED Final   Carbapenem resistance VIM  NOT DETECTED NOT DETECTED Final    Comment: Performed at [facility], [place] [address] [place], [place] [address]  Urine Culture     Status: None   Collection Time: 09/26/20 11:06 AM   Specimen: Urine, Catheterized  Result Value Ref Range Status   Specimen Description URINE, CATHETERIZED  Final   Special Requests NONE  Final   Culture   Final    NO GROWTH Performed at [facility], [address] [place], [place] [address]    Report Status 09/27/2020 FINAL  Final         Radiology Studies: CT Angio Chest Pulmonary Embolism (PE) W or WO Contrast  Result Date: 09/25/2020 CLINICAL DATA:  PE suspected, high prob EXAM: CT ANGIOGRAPHY CHEST WITH CONTRAST TECHNIQUE: Multidetector CT imaging of the chest was performed using the standard protocol during bolus administration of intravenous contrast. Multiplanar CT image reconstructions and MIPs were obtained to evaluate the vascular anatomy. CONTRAST:  37m OMNIPAQUE IOHEXOL 350 MG/ML SOLN COMPARISON:  10/15/2014 FINDINGS: Cardiovascular: The pulmonary arteries are obscured in the lung bases due to respiratory motion. No visible suspicious filling defects to suggest pulmonary emboli. Heart is normal size. Aorta is normal caliber. Prior CABG. Aortic calcifications. Mediastinum/Nodes: No mediastinal, hilar, or axillary adenopathy. Trachea and esophagus are unremarkable. Thyroid unremarkable. Lungs/Pleura: Small right pleural effusion. Right base compressive atelectasis and minimal left base atelectasis. No effusions on the left. Upper Abdomen: Imaging into the upper abdomen demonstrates no acute findings. Musculoskeletal: Chest wall soft tissues are unremarkable. No acute bony abnormality. Review of the MIP images confirms the above findings. IMPRESSION: No visible pulmonary embolus. Pulmonary arteries obscured in the lung bases due to respiratory motion. Small right pleural effusion.  Bibasilar atelectasis. Aortic Atherosclerosis  (ICD10-I70.0). Electronically Signed   By: KRolm BaptiseM.D.   On: 09/25/2020 19:01   [place]  Result Date: 09/26/2020 CLINICAL DATA:  Acute renal injury. EXAM: RENAL / URINARY TRACT ULTRASOUND COMPLETE COMPARISON:  None. FINDINGS: Right Kidney: Renal measurements: 10.2 x 5.5 x 4.5 cm = volume: 131 mL. Echogenicity within normal limits. No mass or hydronephrosis visualized. Note is made that small solid lesions cannot be excluded by ultrasound. Left Kidney: Renal measurements: 10.7 x 5.3 x 4.5 cm = volume: 134 mL. Echogenicity within normal limits. No mass or hydronephrosis visualized.Note is made that small solid lesions cannot be excluded by ultrasound. Bladder: Bladder diverticulum there is Keonte Daubenspeck questionable bladder diverticulum. The bladder is otherwise grossly within normal limits. Other: None. IMPRESSION: 1. No evidence for hydronephrosis. 2. Questionable bladder diverticulum. This can be further evaluated with dedicated CT or ultrasound when patient's condition permits. Electronically Signed   By: ARonney AstersM.D.   On: 09/26/2020 19:57   DG CHEST PORT 1 VIEW  Result Date: 09/27/2020 CLINICAL DATA:  Pleural effusion, abnormal CT EXAM: PORTABLE CHEST 1 VIEW COMPARISON:  CTA chest 09/25/2020 FINDINGS: Median sternotomy wires and mediastinal surgical material are stable. The heart is enlarged, unchanged. The mediastinal contours are stable, with unchanged calcified atherosclerotic plaque of the aortic arch. There is vascular congestion and increased interstitial markings likely reflecting mild pulmonary interstitial edema. There is Jhamal Plucinski small right pleural effusion, likely not significantly changed. There is mild left basilar subsegmental atelectasis. There is no significant left effusion. There is no pneumothorax. There is no acute osseous abnormality. IMPRESSION: Cardiomegaly with mild pulmonary interstitial edema and small right pleural effusion. Electronically Signed   By:  Valetta Mole M.D.   On:  09/27/2020 10:49        Scheduled Meds:  buPROPion  150 mg Oral q morning   Chlorhexidine Gluconate Cloth  6 each Topical Daily   docusate sodium  100 mg Oral BID   DULoxetine  60 mg Oral Daily   feeding supplement (GLUCERNA SHAKE)  237 mL Oral TID BM   gabapentin  300 mg Oral TID   insulin aspart  0-20 Units Subcutaneous TID WC   insulin detemir  20 Units Subcutaneous Q2200   isosorbide mononitrate  30 mg Oral Daily   ketorolac  1 drop Right Eye Daily   magnesium oxide  400 mg Oral BID   metoprolol succinate  50 mg Oral Daily   multivitamin with minerals  1 tablet Oral Daily   omega-3 acid ethyl esters  1 g Oral Daily   pantoprazole  40 mg Oral QHS   polyethylene glycol  17 g Oral Daily   rosuvastatin  20 mg Oral Q2200   senna  1 tablet Oral BID   tamsulosin  0.4 mg Oral Daily   vitamin B-12  1,000 mcg Oral Daily   Continuous Infusions:  ceFEPime (MAXIPIME) IV 2 g (09/27/20 1502)     LOS: 9 days    Time spent: over 30 min    Fayrene Helper, MD [facility]   To contact the attending provider between 7A-7P or the covering provider during after hours 7P-7A, please log into the web site [email or web address] and access using universal [place] password for that web site. If you do not have the password, please call the hospital operator.  09/27/2020, 5:41 PM

## 2020-09-27 NOTE — Progress Notes (Signed)
  NEUROSURGERY PROGRESS NOTE   No issues overnight. Pt more awake, without significant complaint this am. Wife reports some productive cough.  EXAM:  BP (!) 145/80 (BP Location: Left Arm)   Pulse 87   Temp 98.2 F (36.8 C) (Oral)   Resp 18   Ht '5\' 9"'$  (1.753 m)   Wt 98.2 kg   SpO2 99%   BMI 31.96 kg/m   Awake, alert, oriented to person, place, year Speech fluent, appropriate  CN grossly intact  5/5 BUE/BLE  Wound dressing has some serosanguinous drainage, not actively draining. Minimal erythema or tenderness. Dermabond appears to have peeled off.  LABS: Blood Culture    Component Value Date/Time   SDES BLOOD LEFT HAND 09/25/2020 2146   SDES BLOOD RIGHT HAND 09/25/2020 2146   SPECREQUEST AEROBIC BOTTLE ONLY Blood Culture adequate volume 09/25/2020 2146   SPECREQUEST AEROBIC BOTTLE ONLY Blood Culture adequate volume 09/25/2020 2146   CULT GRAM NEGATIVE RODS 09/25/2020 2146   CULT  09/25/2020 2146    NO GROWTH 2 DAYS Performed at [facility], [place] [address] [place], [place] [address]    REPTSTATUS PENDING 09/25/2020 2146   REPTSTATUS PENDING 09/25/2020 2146    IMPRESSION:  85 y.o. male s/p lumbar fusion with SIRS. Clinically improving on broad abx. 1/2 blood culture (+) gram neg rods - serratia and enterobacter. ?contaminant. Repeat cx 9/8 pending.  Wound does not appear infected.  PLAN: - Cont abx, await repeat cultures and urine cx - Cont to mobilize with PT/OT  Consuella Lose, MD [facility]

## 2020-09-27 NOTE — Plan of Care (Signed)
  Problem: Education: Goal: Ability to verbalize activity precautions or restrictions will improve Outcome: Progressing Goal: Knowledge of the prescribed therapeutic regimen will improve Outcome: Progressing Goal: Understanding of discharge needs will improve Outcome: Progressing   Problem: Activity: Goal: Ability to avoid complications of mobility impairment will improve Outcome: Progressing Goal: Ability to tolerate increased activity will improve Outcome: Progressing Goal: Will remain free from falls Outcome: Progressing   Problem: Bowel/Gastric: Goal: Gastrointestinal status for postoperative course will improve Outcome: Progressing   Problem: Clinical Measurements: Goal: Ability to maintain clinical measurements within normal limits will improve Outcome: Progressing Goal: Postoperative complications will be avoided or minimized Outcome: Progressing Goal: Diagnostic test results will improve Outcome: Progressing   Problem: Pain Management: Goal: Pain level will decrease Outcome: Progressing   Problem: Skin Integrity: Goal: Will show signs of wound healing Outcome: Progressing   Problem: Health Behavior/Discharge Planning: Goal: Identification of resources available to assist in meeting health care needs will improve Outcome: Progressing   Problem: Bladder/Genitourinary: Goal: Urinary functional status for postoperative course will improve Outcome: Progressing   Problem: Safety: Goal: Ability to remain free from injury will improve Outcome: Progressing   Problem: Safety: Goal: Non-violent Restraint(s) Outcome: Progressing

## 2020-09-28 ENCOUNTER — Inpatient Hospital Stay (HOSPITAL_COMMUNITY): Payer: Medicare Other

## 2020-09-28 DIAGNOSIS — J810 Acute pulmonary edema: Secondary | ICD-10-CM

## 2020-09-28 DIAGNOSIS — N189 Chronic kidney disease, unspecified: Secondary | ICD-10-CM

## 2020-09-28 DIAGNOSIS — E119 Type 2 diabetes mellitus without complications: Secondary | ICD-10-CM | POA: Diagnosis not present

## 2020-09-28 DIAGNOSIS — J9601 Acute respiratory failure with hypoxia: Secondary | ICD-10-CM

## 2020-09-28 DIAGNOSIS — J90 Pleural effusion, not elsewhere classified: Secondary | ICD-10-CM | POA: Diagnosis not present

## 2020-09-28 DIAGNOSIS — R0902 Hypoxemia: Secondary | ICD-10-CM | POA: Diagnosis not present

## 2020-09-28 LAB — BASIC METABOLIC PANEL
Anion gap: 9 (ref 5–15)
BUN: 31 mg/dL — ABNORMAL HIGH (ref 8–23)
CO2: 26 mmol/L (ref 22–32)
Calcium: 8.7 mg/dL — ABNORMAL LOW (ref 8.9–10.3)
Chloride: 94 mmol/L — ABNORMAL LOW (ref 98–111)
Creatinine, Ser: 1.73 mg/dL — ABNORMAL HIGH (ref 0.61–1.24)
GFR, Estimated: 39 mL/min — ABNORMAL LOW (ref >60)
Glucose, Bld: 157 mg/dL — ABNORMAL HIGH (ref 70–99)
Potassium: 4.8 mmol/L (ref 3.5–5.1)
Sodium: 129 mmol/L — ABNORMAL LOW (ref 135–145)

## 2020-09-28 LAB — CBC
HCT: 26.2 % — ABNORMAL LOW (ref 39.0–52.0)
Hemoglobin: 8.4 g/dL — ABNORMAL LOW (ref 13.0–17.0)
MCH: 30.4 pg (ref 26.0–34.0)
MCHC: 32.1 g/dL (ref 30.0–36.0)
MCV: 94.9 fL (ref 80.0–100.0)
Platelets: 174 10*3/uL (ref 150–400)
RBC: 2.76 MIL/uL — ABNORMAL LOW (ref 4.22–5.81)
RDW: 14.5 % (ref 11.5–15.5)
WBC: 7.4 10*3/uL (ref 4.0–10.5)
nRBC: 0 % (ref 0.0–0.2)

## 2020-09-28 LAB — CULTURE, BLOOD (ROUTINE X 2): Special Requests: ADEQUATE

## 2020-09-28 LAB — BLOOD GAS, VENOUS
Acid-Base Excess: 3.5 mmol/L — ABNORMAL HIGH (ref 0.0–2.0)
Bicarbonate: 27.9 mmol/L (ref 20.0–28.0)
FIO2: 28
O2 Saturation: 63.3 %
Patient temperature: 37
pCO2, Ven: 45.1 mmHg (ref 44.0–60.0)
pH, Ven: 7.407 (ref 7.250–7.430)
pO2, Ven: 36.6 mmHg (ref 32.0–45.0)

## 2020-09-28 LAB — ECHOCARDIOGRAM COMPLETE
Area-P 1/2: 3.99 cm2
Height: 69 in
S' Lateral: 3.7 cm
Weight: 3343.94 oz

## 2020-09-28 LAB — GLUCOSE, CAPILLARY
Glucose-Capillary: 147 mg/dL — ABNORMAL HIGH (ref 70–99)
Glucose-Capillary: 160 mg/dL — ABNORMAL HIGH (ref 70–99)
Glucose-Capillary: 85 mg/dL (ref 70–99)
Glucose-Capillary: 175 mg/dL — ABNORMAL HIGH (ref 70–99)

## 2020-09-28 LAB — TSH: TSH: 5.088 u[IU]/mL — ABNORMAL HIGH (ref 0.350–4.500)

## 2020-09-28 LAB — OSMOLALITY: Osmolality: 286 mOsm/kg (ref 275–295)

## 2020-09-28 LAB — FOLATE: Folate: 21.6 ng/mL (ref >5.9)

## 2020-09-28 LAB — VITAMIN B12: Vitamin B-12: 1589 pg/mL — ABNORMAL HIGH (ref 180–914)

## 2020-09-28 LAB — MAGNESIUM: Magnesium: 2.3 mg/dL (ref 1.7–2.4)

## 2020-09-28 LAB — AMMONIA: Ammonia: 15 umol/L (ref 9–35)

## 2020-09-28 LAB — PHOSPHORUS: Phosphorus: 4 mg/dL (ref 2.5–4.6)

## 2020-09-28 MED ORDER — METOPROLOL SUCCINATE ER 25 MG PO TB24
25.0000 mg | ORAL_TABLET | Freq: Every day | ORAL | Status: DC
  Administered 2020-09-29 – 2020-10-11 (×12): 25 mg via ORAL
  Filled 2020-09-28 (×13): qty 1

## 2020-09-28 MED ORDER — ORAL CARE MOUTH RINSE
15.0000 mL | Freq: Two times a day (BID) | OROMUCOSAL | Status: DC
  Administered 2020-09-28 – 2020-10-10 (×20): 15 mL via OROMUCOSAL

## 2020-09-28 MED ORDER — IPRATROPIUM-ALBUTEROL 0.5-2.5 (3) MG/3ML IN SOLN
3.0000 mL | Freq: Four times a day (QID) | RESPIRATORY_TRACT | Status: DC
  Administered 2020-09-28 – 2020-09-29 (×4): 3 mL via RESPIRATORY_TRACT
  Filled 2020-09-28 (×4): qty 3

## 2020-09-28 MED ORDER — PERFLUTREN LIPID MICROSPHERE
1.0000 mL | INTRAVENOUS | Status: AC | PRN
  Administered 2020-09-28: 3 mL via INTRAVENOUS
  Filled 2020-09-28: qty 10

## 2020-09-28 MED ORDER — PERFLUTREN LIPID MICROSPHERE
1.0000 mL | INTRAVENOUS | Status: AC | PRN
  Filled 2020-09-28: qty 10

## 2020-09-28 NOTE — Progress Notes (Addendum)
Consult NOTE    Ricky Nelson  [id number] DOB: September 27, 1935 DOA: 09/18/2020 PCP: Pcp, No   No chief complaint on file.  Brief Narrative:  Ricky Nelson is Ricky Nelson 85 y.o. male with medical history significant for CAD, T2DM, CKD stage IIIb, HTN, OSA on CPAP who was admitted under the neurosurgery service after undergoing L2-L4 decompression/fusion on 09/18/2020.  Patient had new fever developing 9/6  with transient episodes of tachycardia and hypotension.  No clear infectious source.  Surgical site appears clean.  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  Blood cultures and urine cultures were ordered but still pending collection.  Assessment & Plan:   Active Problems:   Lumbar spinal stenosis   Sleep apnea   Diabetes mellitus without complication (HCC)   Coronary artery disease   CKD (chronic kidney disease)   SIRS (systemic inflammatory response syndrome) ([name])  Addendum: notified by therapy, that pt noted "going black" and legs buckling without warning.  Also with tremors and jerking.  ? If this is orthostasis with presyncope.  Reevaluated and no clear tremor on exam, no rigidity, no focal deficits.  Will decrease dose of metoprolol.  Continue to monitor.  orthostatics tomorrow   Sepsis 2/2 Serratia Marcescens Bacteremia: Fever with T-max 103.7 F rectally on 9/6. Also with reported rigors. associated tachycardia, tachypnea, transient hypotension Now with 1/2 sets blood cultures showing serratia Repeat blood cultures from 9/8 pending  Source not clear -> urinary vs pulmonary - post op site without si/sx infection per nsgy  Urine cx no growth (after abx) - UA is pending CXR notable for mild interstitial edema and R pleural effusion COVID and influenza negative.   Narrow to cefepime for now ID c/s, appreciate recommendations  Acute Hypoxic Respiratory Failure  History of COPD  Mild Pulmonary Edema - could be related to COPD (though not chronically on O2), small R effusion on CT scan could represent  pna - currently on 2-3 L - CXR with cardiomegaly and mild pulm edema with small R effusion - mild wheeze noted today - transmitted upper airway sounds? Schedule nebs. - repeat CXR 9/10  - follow echo - strict I/O, daily weights (he's net negative, weights downtrending) - hold additional IVF for now, consider lasix (though he's net negative 10.7 L at this time and weight is down for admission - doesn't look significantly overloaded on exam) - wean as tolerated, w/u further as indicated - SLP eval   Acute encephalopathy: Persistent today Likely 2/2 above ammonia (wnl).  Follow B12, folate, TSH (mildly elevated).  Follow VBG with noted wheeze. Minimize sedating medications, hold narcotics, Robaxin.  Hold gabapentin for now. delirium precautions - will allow family to stay at bedside overnight   Hyponatremia: With [name] normal serum osm, will continue to monitor Urine sodium 83   S/p L2-L4 decompression/fusion 09/18/2020: Per neurosurgery.   CKD stage IIIb: Creatinine 2.46 at presentation Baseline unclear, as low as 1.58 here Fluctuating and now 1.73 Renal US (without evidence hydro - ? Bladder diverticulum), UA (pending) Hold additional IVF - net negative here   Type 2 diabetes: Hold home glipizide, Jardiance, Victoza and reduce Levemir  Adjust insulin as needed, continue SSI    CAD: Plavix has been held in the perioperative period.  Continue Toprol-XL and Imdur as BP allows.   Iron deficiency anemia: S/p 2 unit PRBC for acute blood loss anemia (9/2 and 9/3)   Hypertension: Continue Toprol-XL and Imdur as BP allows.   OSA: Continue CPAP nightly.  Bladder Diverticulum Needs follow up  with dedicated CT or [place] as allowed  DVT prophylaxis: SCD Code Status: full  Family Communication: wife at bedside Disposition:   Per [place]       Consultants:  [place] [facility]   Procedures: PROCEDURE: 1. L2, L3 laminectomy with facetectomy for decompression of exiting nerve roots,  more than would be required for placement of interbody graft 2. Placement of anterior interbody device - Medtronic epandable 67m cage x4 3. Posterior segmental instrumentation using Medtronic Solera pedicle screws and previously place Stryker D90 screws, L2 - L5 4. Interbody arthrodesis, L2-3, L3-4 5. Use of locally harvested bone autograft 6. Use of non-structural bone allograft - ProteOs 7. Removal of previous rod, L4-5  Antimicrobials: Anti-infectives (From admission, onward)    Start     Dose/Rate Route Frequency Ordered Stop   09/28/20 0200  vancomycin (VANCOREADY) IVPB 1500 mg/300 mL  Status:  Discontinued        1,500 mg 150 mL/hr over 120 Minutes Intravenous Every 48 hours 09/26/20 0035 09/27/20 1106   09/26/20 1400  ceFEPIme (MAXIPIME) 2 g in sodium chloride 0.9 % 100 mL IVPB        2 g 200 mL/hr over 30 Minutes Intravenous Every 12 hours 09/26/20 0035     09/26/20 0130  vancomycin (VANCOREADY) IVPB 2000 mg/400 mL        2,000 mg 200 mL/hr over 120 Minutes Intravenous STAT 09/26/20 0035 09/26/20 0854   09/26/20 0100  ceFEPIme (MAXIPIME) 2 g in sodium chloride 0.9 % 100 mL IVPB        2 g 200 mL/hr over 30 Minutes Intravenous STAT 09/26/20 0035 09/26/20 0852   09/18/20 2300  ceFAZolin (ANCEF) IVPB 2g/100 mL premix        2 g 200 mL/hr over 30 Minutes Intravenous Every 8 hours 09/18/20 1852 09/19/20 0633   09/18/20 0930  ceFAZolin (ANCEF) IVPB 2g/100 mL premix        2 g 200 mL/hr over 30 Minutes Intravenous On call to O.R. 09/18/20 0925 09/18/20 1520          Subjective: Again confused this morning In restraints  Objective: Vitals:   09/27/20 2307 09/28/20 0309 09/28/20 0452 09/28/20 0739  BP: (!) 127/99 (!) 157/86  (!) 166/88  Pulse: (!) 111 98  90  Resp: '20 19  19  '$ Temp: 98.2 F (36.8 C) 98.5 F (36.9 C)  97.6 F (36.4 C)  TempSrc: Oral Oral  Oral  SpO2: 99% 96%  98%  Weight:   94.8 kg   Height:        Intake/Output Summary (Last 24 hours) at 09/28/2020  0913 Last data filed at 09/28/2020 0850 Gross per 24 hour  Intake 600 ml  Output 2350 ml  Net -1750 ml   Filed Weights   09/18/20 0928 09/28/20 0452  Weight: 98.2 kg 94.8 kg    Examination:  General: No acute distress. Cardiovascular: Heart sounds show Dudley Cooley regular rate, and rhythm.  Lungs: transmitted upper airway sounds, no clear adventitious lung sounds otherwise Abdomen: Soft, nontender, nondistended Neurological: Pleasantly confused, in restraints at this time. Moves all extremities 4 . Cranial nerves II through XII grossly intact. Skin: Warm and dry. No rashes or lesions. Extremities: no significant LEE     Data Reviewed: I have personally reviewed following labs and imaging studies  CBC: Recent Labs  Lab 09/25/20 0243 09/25/20 2146 09/26/20 0201 09/27/20 0352 09/28/20 0151  WBC 7.6 17.6* 13.5* 6.5 7.4  NEUTROABS  --  15.7*  --  5.0  --   HGB 9.4* 9.0* 8.7* 8.0* 8.4*  HCT 29.2* 28.6* 28.0* 25.1* 26.2*  MCV 94.5 96.9 97.6 95.1 94.9  PLT 192 206 201 171 [id number]    Basic Metabolic Panel: Recent Labs  Lab 09/24/20 0450 09/25/20 0243 09/26/20 0201 09/26/20 1151 09/27/20 0352 09/28/20 0151  NA 131* 130* 130*  --  129* 129*  K 4.0 4.6 5.5* 4.8 4.4 4.8  CL 97* 95* 94*  --  93* 94*  CO2 '26 26 26  '$ --  26 26  GLUCOSE 99 115* 176*  --  154* 157*  BUN 33* 36* 41*  --  37* 31*  CREATININE 1.79* 1.87* 2.08*  --  1.84* 1.73*  CALCIUM 8.9 8.7* 8.6*  --  9.1 8.7*  MG  --   --   --   --  2.2  --   PHOS  --   --   --   --  4.2  --     GFR: Estimated Creatinine Clearance: 36.7 mL/min (Kerby Hockley) (by C-G formula based on SCr of 1.73 mg/dL (H)).  Liver Function Tests: Recent Labs  Lab 09/21/20 1120 09/27/20 0352  AST 57* 80*  ALT 10 41  ALKPHOS 51 68  BILITOT 0.7 0.4  PROT 6.0* 6.4*  ALBUMIN 2.6* 2.2*    CBG: Recent Labs  Lab 09/27/20 0736 09/27/20 1131 09/27/20 1549 09/27/20 2150 09/28/20 0738  GLUCAP 156* 239* 130* 150* 147*     Recent Results (from the  past 240 hour(s))  Resp Panel by RT-PCR (Flu Wofford Stratton&B, Covid) Nasopharyngeal Swab     Status: None   Collection Time: 09/25/20 12:44 PM   Specimen: Nasopharyngeal Swab; Nasopharyngeal(NP) swabs in vial transport medium  Result Value Ref Range Status   SARS Coronavirus 2 by RT PCR NEGATIVE NEGATIVE Final    Comment: (NOTE) SARS-CoV-2 target nucleic acids are NOT DETECTED.  The SARS-CoV-2 RNA is generally detectable in upper respiratory specimens during the acute phase of infection. The lowest concentration of SARS-CoV-2 viral copies this assay can detect is 138 copies/mL. Siriyah Ambrosius negative result does not preclude SARS-Cov-2 infection and should not be used as the sole basis for treatment or other patient management decisions. Cleaster Shiffer negative result may occur with  improper specimen collection/handling, submission of specimen other than nasopharyngeal swab, presence of viral mutation(s) within the areas targeted by this assay, and inadequate number of viral copies(<138 copies/mL). Graciela Plato negative result must be combined with clinical observations, patient history, and epidemiological information. The expected result is Negative.  Fact Sheet for Patients:  EntrepreneurPulse.com.au  Fact Sheet for Healthcare Providers:  IncredibleEmployment.be  This test is no t yet approved or cleared by the [place] FDA and  has been authorized for detection and/or diagnosis of SARS-CoV-2 by FDA under an Emergency Use Authorization (EUA). This EUA will remain  in effect (meaning this test can be used) for the duration of the COVID-19 declaration under Section 564(b)(1) of the Act, 21 U.S.C.section 360bbb-3(b)(1), unless the authorization is terminated  or revoked sooner.       Influenza Crespin Forstrom by PCR NEGATIVE NEGATIVE Final   Influenza B by PCR NEGATIVE NEGATIVE Final    Comment: (NOTE) The Xpert Xpress SARS-CoV-2/FLU/RSV plus assay is intended as an aid in the diagnosis of  influenza from Nasopharyngeal swab specimens and should not be used as Greg Eckrich sole basis for treatment. Nasal washings and aspirates are unacceptable for Xpert Xpress SARS-CoV-2/FLU/RSV testing.  Fact Sheet for Patients: EntrepreneurPulse.com.au  Fact Sheet for  Healthcare Providers: IncredibleEmployment.be  This test is not yet approved or cleared by the [place] and has been authorized for detection and/or diagnosis of SARS-CoV-2 by FDA under an Emergency Use Authorization (EUA). This EUA will remain in effect (meaning this test can be used) for the duration of the COVID-19 declaration under Section 564(b)(1) of the Act, 21 U.S.C. section 360bbb-3(b)(1), unless the authorization is terminated or revoked.  Performed at [facility], [place] [address], [place], [place] [address]   Culture, blood (routine x 2)     Status: Abnormal (Preliminary result)   Collection Time: 09/25/20  9:46 PM   Specimen: BLOOD LEFT HAND  Result Value Ref Range Status   Specimen Description BLOOD LEFT HAND  Final   Special Requests AEROBIC BOTTLE ONLY Blood Culture adequate volume  Final   Culture  Setup Time   Final    GRAM NEGATIVE RODS AEROBIC BOTTLE ONLY CRITICAL RESULT CALLED TO, READ BACK BY AND VERIFIED WITH: PHARMD Pilar Plate WILSON 09/27/20'@00'$ :40 BY TW Performed at [facility], [place] [address], [place], [place] [address]    Culture SERRATIA MARCESCENS (Cailie Bosshart)  Final   Report Status PENDING  Incomplete  Culture, blood (routine x 2)     Status: None (Preliminary result)   Collection Time: 09/25/20  9:46 PM   Specimen: BLOOD RIGHT HAND  Result Value Ref Range Status   Specimen Description BLOOD RIGHT HAND  Final   Special Requests AEROBIC BOTTLE ONLY Blood Culture adequate volume  Final   Culture   Final    NO GROWTH 2 DAYS Performed at [facility], [place] [address], [place], [place] [address]    Report Status PENDING  Incomplete  Blood Culture  ID Panel (Reflexed)     Status: Abnormal   Collection Time: 09/25/20  9:46 PM  Result Value Ref Range Status   Enterococcus faecalis NOT DETECTED NOT DETECTED Final   Enterococcus Faecium NOT DETECTED NOT DETECTED Final   Listeria monocytogenes NOT DETECTED NOT DETECTED Final   Staphylococcus species NOT DETECTED NOT DETECTED Final   Staphylococcus aureus (BCID) NOT DETECTED NOT DETECTED Final   Staphylococcus epidermidis NOT DETECTED NOT DETECTED Final   Staphylococcus lugdunensis NOT DETECTED NOT DETECTED Final   Streptococcus species NOT DETECTED NOT DETECTED Final   Streptococcus agalactiae NOT DETECTED NOT DETECTED Final   Streptococcus pneumoniae NOT DETECTED NOT DETECTED Final   Streptococcus pyogenes NOT DETECTED NOT DETECTED Final   Kambry Takacs.calcoaceticus-baumannii NOT DETECTED NOT DETECTED Final   Bacteroides fragilis NOT DETECTED NOT DETECTED Final   Enterobacterales DETECTED (Casanova Schurman) NOT DETECTED Final    Comment: Enterobacterales represent Zachary Nole large order of gram negative bacteria, not Teren Zurcher single organism. CRITICAL RESULT CALLED TO, READ BACK BY AND VERIFIED WITH: PHARMD FRANK WILSON 09/27/20'@00'$ :40 BY TW    Enterobacter cloacae complex NOT DETECTED NOT DETECTED Final   Escherichia coli NOT DETECTED NOT DETECTED Final   Klebsiella aerogenes NOT DETECTED NOT DETECTED Final   Klebsiella oxytoca NOT DETECTED NOT DETECTED Final   Klebsiella pneumoniae NOT DETECTED NOT DETECTED Final   Proteus species NOT DETECTED NOT DETECTED Final   Salmonella species NOT DETECTED NOT DETECTED Final   Serratia marcescens DETECTED (Edwin Baines) NOT DETECTED Final    Comment: CRITICAL RESULT CALLED TO, READ BACK BY AND VERIFIED WITH: PHARMD FRANK WILSON 09/27/20'@00'$ :40 BY TW    Haemophilus influenzae NOT DETECTED NOT DETECTED Final   Neisseria meningitidis NOT DETECTED NOT DETECTED Final   Pseudomonas aeruginosa NOT DETECTED NOT  DETECTED Final   Stenotrophomonas maltophilia NOT DETECTED NOT DETECTED Final   Candida  albicans NOT DETECTED NOT DETECTED Final   Candida auris NOT DETECTED NOT DETECTED Final   Candida glabrata NOT DETECTED NOT DETECTED Final   Candida krusei NOT DETECTED NOT DETECTED Final   Candida parapsilosis NOT DETECTED NOT DETECTED Final   Candida tropicalis NOT DETECTED NOT DETECTED Final   Cryptococcus neoformans/gattii NOT DETECTED NOT DETECTED Final   CTX-M ESBL NOT DETECTED NOT DETECTED Final   Carbapenem resistance IMP NOT DETECTED NOT DETECTED Final   Carbapenem resistance KPC NOT DETECTED NOT DETECTED Final   Carbapenem resistance NDM NOT DETECTED NOT DETECTED Final   Carbapenem resist OXA 48 LIKE NOT DETECTED NOT DETECTED Final   Carbapenem resistance VIM NOT DETECTED NOT DETECTED Final    Comment: Performed at [facility], [address], [place], [place] [address]  Urine Culture     Status: None   Collection Time: 09/26/20 11:06 AM   Specimen: Urine, Catheterized  Result Value Ref Range Status   Specimen Description URINE, CATHETERIZED  Final   Special Requests NONE  Final   Culture   Final    NO GROWTH Performed at [facility], [address], [place], [place] [address]    Report Status 09/27/2020 FINAL  Final         Radiology Studies: US RENAL  Result Date: 09/26/2020 CLINICAL DATA:  Acute renal injury. EXAM: RENAL / URINARY TRACT ULTRASOUND COMPLETE COMPARISON:  None. FINDINGS: Right Kidney: Renal measurements: 10.2 x 5.5 x 4.5 cm = volume: 131 mL. Echogenicity within normal limits. No mass or hydronephrosis visualized. Note is made that small solid lesions cannot be excluded by ultrasound. Left Kidney: Renal measurements: 10.7 x 5.3 x 4.5 cm = volume: 134 mL. Echogenicity within normal limits. No mass or hydronephrosis visualized.Note is made that small solid lesions cannot be excluded by ultrasound. Bladder: Bladder diverticulum there is Scott Fix questionable bladder diverticulum. The bladder is otherwise grossly within normal limits. Other: None.  IMPRESSION: 1. No evidence for hydronephrosis. 2. Questionable bladder diverticulum. This can be further evaluated with dedicated CT or ultrasound when patient's condition permits. Electronically Signed   By: Ronney Asters M.D.   On: 09/26/2020 19:57   DG CHEST PORT 1 VIEW  Result Date: 09/27/2020 CLINICAL DATA:  Pleural effusion, abnormal CT EXAM: PORTABLE CHEST 1 VIEW COMPARISON:  CTA chest 09/25/2020 FINDINGS: Median sternotomy wires and mediastinal surgical material are stable. The heart is enlarged, unchanged. The mediastinal contours are stable, with unchanged calcified atherosclerotic plaque of the aortic arch. There is vascular congestion and increased interstitial markings likely reflecting mild pulmonary interstitial edema. There is Chimene Salo small right pleural effusion, likely not significantly changed. There is mild left basilar subsegmental atelectasis. There is no significant left effusion. There is no pneumothorax. There is no acute osseous abnormality. IMPRESSION: Cardiomegaly with mild pulmonary interstitial edema and small right pleural effusion. Electronically Signed   By: Valetta Mole M.D.   On: 09/27/2020 10:49        Scheduled Meds:  buPROPion  150 mg Oral q morning   Chlorhexidine Gluconate Cloth  6 each Topical Daily   docusate sodium  100 mg Oral BID   DULoxetine  60 mg Oral Daily   feeding supplement (GLUCERNA SHAKE)  237 mL Oral TID BM   insulin aspart  0-20 Units Subcutaneous TID WC   insulin detemir  20 Units Subcutaneous Q2200   isosorbide mononitrate  30  mg Oral Daily   ketorolac  1 drop Right Eye Daily   magnesium oxide  400 mg Oral BID   metoprolol succinate  50 mg Oral Daily   multivitamin with minerals  1 tablet Oral Daily   omega-3 acid ethyl esters  1 g Oral Daily   pantoprazole  40 mg Oral QHS   polyethylene glycol  17 g Oral Daily   rosuvastatin  20 mg Oral Q2200   senna  1 tablet Oral BID   tamsulosin  0.4 mg Oral Daily   vitamin B-12  1,000 mcg Oral Daily    Continuous Infusions:  ceFEPime (MAXIPIME) IV 2 g (09/28/20 0129)     LOS: 10 days    Time spent: over 30 min    Fayrene Helper, MD [facility]   To contact the attending provider between 7A-7P or the covering provider during after hours 7P-7A, please log into the web site [email or web address] and access using universal [place] password for that web site. If you do not have the password, please call the hospital operator.  09/28/2020, 9:13 AM

## 2020-09-28 NOTE — Plan of Care (Signed)
  Problem: Education: Goal: Ability to verbalize activity precautions or restrictions will improve Outcome: Progressing Goal: Knowledge of the prescribed therapeutic regimen will improve Outcome: Progressing Goal: Understanding of discharge needs will improve Outcome: Progressing   Problem: Activity: Goal: Ability to avoid complications of mobility impairment will improve Outcome: Progressing Goal: Ability to tolerate increased activity will improve Outcome: Progressing Goal: Will remain free from falls Outcome: Progressing   Problem: Bowel/Gastric: Goal: Gastrointestinal status for postoperative course will improve Outcome: Progressing   Problem: Clinical Measurements: Goal: Ability to maintain clinical measurements within normal limits will improve Outcome: Progressing Goal: Postoperative complications will be avoided or minimized Outcome: Progressing Goal: Diagnostic test results will improve Outcome: Progressing   Problem: Pain Management: Goal: Pain level will decrease Outcome: Progressing   Problem: Skin Integrity: Goal: Will show signs of wound healing Outcome: Progressing   Problem: Health Behavior/Discharge Planning: Goal: Identification of resources available to assist in meeting health care needs will improve Outcome: Progressing   Problem: Bladder/Genitourinary: Goal: Urinary functional status for postoperative course will improve Outcome: Progressing   Problem: Safety: Goal: Ability to remain free from injury will improve Outcome: Progressing   Problem: Safety: Goal: Non-violent Restraint(s) Outcome: Progressing

## 2020-09-28 NOTE — Progress Notes (Signed)
  Echocardiogram 2D Echocardiogram has been performed.  Johny Chess 09/28/2020, 4:59 PM

## 2020-09-28 NOTE — Progress Notes (Signed)
[facility] for Infectious Disease  Date of Admission:  09/18/2020   Total days of antibiotics 3         Cefepime  ASSESSMENT: Patient with Serratia bacteremia likely from urinary or respiratory source.  Urine culture came back negative but it was obtained after 1 dose of cefepime.  Will continue IV cefepime while he is inpatient and transition to fluoroquinolone p.o. after discharge.  Will treat for 7 days.  PLAN: Continue cefepime while inpatient.  Transition to p.o. fluoroquinolone after discharge. End date 10/02/20.  Active Problems:   Lumbar spinal stenosis   Sleep apnea   Diabetes mellitus without complication (HCC)   Coronary artery disease   CKD (chronic kidney disease)   SIRS (systemic inflammatory response syndrome) (HCC)   Hypoxia   Pleural effusion   Scheduled Meds:  buPROPion  150 mg Oral q morning   Chlorhexidine Gluconate Cloth  6 each Topical Daily   docusate sodium  100 mg Oral BID   DULoxetine  60 mg Oral Daily   feeding supplement (GLUCERNA SHAKE)  237 mL Oral TID BM   insulin aspart  0-20 Units Subcutaneous TID WC   insulin detemir  20 Units Subcutaneous Q2200   ipratropium-albuterol  3 mL Nebulization QID   isosorbide mononitrate  30 mg Oral Daily   ketorolac  1 drop Right Eye Daily   magnesium oxide  400 mg Oral BID   metoprolol succinate  50 mg Oral Daily   multivitamin with minerals  1 tablet Oral Daily   omega-3 acid ethyl esters  1 g Oral Daily   pantoprazole  40 mg Oral QHS   polyethylene glycol  17 g Oral Daily   rosuvastatin  20 mg Oral Q2200   senna  1 tablet Oral BID   tamsulosin  0.4 mg Oral Daily   vitamin B-12  1,000 mcg Oral Daily   Continuous Infusions:  ceFEPime (MAXIPIME) IV 2 g (09/28/20 0129)   PRN Meds:.acetaminophen **OR** acetaminophen, bisacodyl, fluticasone, ipratropium-albuterol, menthol-cetylpyridinium **OR** phenol, nitroGLYCERIN, ondansetron **OR** ondansetron (ZOFRAN) IV, senna-docusate, triamcinolone  cream   SUBJECTIVE: Patient is seen at bedside this morning with his wife.  He reports feeling well with no change.  Reports similar back pain.  Reports chills.  Review of Systems: ROS Per HPI  Allergies  Allergen Reactions   Allergy Relief [Chlorpheniramine Maleate] Shortness Of Breath   Diazepam     Other reaction(s): Hypotension, Low blood pressure, Other (See Comments)   Diphenhydramine Hcl Shortness Of Breath    Other reaction(s): Other (See Comments), Stopped breathing Other reaction(s): Shortness Of Breath Shortness of breath with benadryl    Diphenhydramine-Zinc Acetate Shortness Of Breath   Atorvastatin Other (See Comments)    Other reaction(s): Muscle pain, Other (See Comments), Unknown Elevated CK      Fluvastatin Other (See Comments)    Other reaction(s): Other (See Comments) myalgias   Lisinopril     Other reaction(s): Hyperkalemia, Other (See Comments)   Metformin And Related     Other reaction(s): Other Renal function   Piperacillin-Tazobactam In Dex Rash    Other reaction(s): Rash Other reaction(s): Rash    Diphenhydramine     Other reaction(s): Low blood pressure   Other Other (See Comments)    Lescol    OBJECTIVE: Vitals:   09/27/20 2307 09/28/20 0309 09/28/20 0452 09/28/20 0739  BP: (!) 127/99 (!) 157/86  (!) 166/88  Pulse: (!) 111 98  90  Resp: 20  19  19  Temp: 98.2 F (36.8 C) 98.5 F (36.9 C)  97.6 F (36.4 C)  TempSrc: Oral Oral  Oral  SpO2: 99% 96%  98%  Weight:   94.8 kg   Height:       Body mass index is 30.86 kg/m.  Physical Exam Constitutional:      General: He is not in acute distress.    Appearance: He is not toxic-appearing.  HENT:     Head: Normocephalic.  Cardiovascular:     Rate and Rhythm: Normal rate and regular rhythm.  Pulmonary:     Effort: Pulmonary effort is normal.     Breath sounds: Normal breath sounds.  Skin:    General: Skin is warm.     Coloration: Skin is not jaundiced.  Neurological:      Mental Status: He is alert.     Comments: Patient is mildly confused  Psychiatric:        Mood and Affect: Mood normal.        Behavior: Behavior normal.    Lab Results Lab Results  Component Value Date   WBC 7.4 09/28/2020   HGB 8.4 (L) 09/28/2020   HCT 26.2 (L) 09/28/2020   MCV 94.9 09/28/2020   PLT 174 09/28/2020    Lab Results  Component Value Date   CREATININE 1.73 (H) 09/28/2020   BUN 31 (H) 09/28/2020   NA 129 (L) 09/28/2020   K 4.8 09/28/2020   CL 94 (L) 09/28/2020   CO2 26 09/28/2020    Lab Results  Component Value Date   ALT 41 09/27/2020   AST 80 (H) 09/27/2020   ALKPHOS 68 09/27/2020   BILITOT 0.4 09/27/2020     Microbiology: Recent Results (from the past 240 hour(s))  Resp Panel by RT-PCR (Flu A&B, Covid) Nasopharyngeal Swab     Status: None   Collection Time: 09/25/20 12:44 PM   Specimen: Nasopharyngeal Swab; Nasopharyngeal(NP) swabs in vial transport medium  Result Value Ref Range Status   SARS Coronavirus 2 by RT PCR NEGATIVE NEGATIVE Final    Comment: (NOTE) SARS-CoV-2 target nucleic acids are NOT DETECTED.  The SARS-CoV-2 RNA is generally detectable in upper respiratory specimens during the acute phase of infection. The lowest concentration of SARS-CoV-2 viral copies this assay can detect is 138 copies/mL. A negative result does not preclude SARS-Cov-2 infection and should not be used as the sole basis for treatment or other patient management decisions. A negative result may occur with  improper specimen collection/handling, submission of specimen other than nasopharyngeal swab, presence of viral mutation(s) within the areas targeted by this assay, and inadequate number of viral copies(<138 copies/mL). A negative result must be combined with clinical observations, patient history, and epidemiological information. The expected result is Negative.  Fact Sheet for Patients:  EntrepreneurPulse.com.au  Fact Sheet for Healthcare  Providers:  IncredibleEmployment.be  This test is no t yet approved or cleared by the [place] FDA and  has been authorized for detection and/or diagnosis of SARS-CoV-2 by FDA under an Emergency Use Authorization (EUA). This EUA will remain  in effect (meaning this test can be used) for the duration of the COVID-19 declaration under Section 564(b)(1) of the Act, 21 U.S.C.section 360bbb-3(b)(1), unless the authorization is terminated  or revoked sooner.       Influenza A by PCR NEGATIVE NEGATIVE Final   Influenza B by PCR NEGATIVE NEGATIVE Final    Comment: (NOTE) The Xpert Xpress SARS-CoV-2/FLU/RSV plus assay is intended as an  aid in the diagnosis of influenza from Nasopharyngeal swab specimens and should not be used as a sole basis for treatment. Nasal washings and aspirates are unacceptable for Xpert Xpress SARS-CoV-2/FLU/RSV testing.  Fact Sheet for Patients: EntrepreneurPulse.com.au  Fact Sheet for Healthcare Providers: IncredibleEmployment.be  This test is not yet approved or cleared by the [place] FDA and has been authorized for detection and/or diagnosis of SARS-CoV-2 by FDA under an Emergency Use Authorization (EUA). This EUA will remain in effect (meaning this test can be used) for the duration of the COVID-19 declaration under Section 564(b)(1) of the Act, 21 U.S.C. section 360bbb-3(b)(1), unless the authorization is terminated or revoked.  Performed at [facility], [place] [address], [place], [place] [address]   Culture, blood (routine x 2)     Status: Abnormal (Preliminary result)   Collection Time: 09/25/20  9:46 PM   Specimen: BLOOD LEFT HAND  Result Value Ref Range Status   Specimen Description BLOOD LEFT HAND  Final   Special Requests AEROBIC BOTTLE ONLY Blood Culture adequate volume  Final   Culture  Setup Time   Final    GRAM NEGATIVE RODS AEROBIC BOTTLE ONLY CRITICAL RESULT CALLED  TO, READ BACK BY AND VERIFIED WITH: PHARMD Pilar Plate WILSON 09/27/20'@00'$ :40 BY TW Performed at [facility], [place] [address], [place], [place] [address]    Culture SERRATIA MARCESCENS (A)  Final   Report Status PENDING  Incomplete  Culture, blood (routine x 2)     Status: None (Preliminary result)   Collection Time: 09/25/20  9:46 PM   Specimen: BLOOD RIGHT HAND  Result Value Ref Range Status   Specimen Description BLOOD RIGHT HAND  Final   Special Requests AEROBIC BOTTLE ONLY Blood Culture adequate volume  Final   Culture   Final    NO GROWTH 2 DAYS Performed at [facility], [place] [address], [place], [place] [address]    Report Status PENDING  Incomplete  Blood Culture ID Panel (Reflexed)     Status: Abnormal   Collection Time: 09/25/20  9:46 PM  Result Value Ref Range Status   Enterococcus faecalis NOT DETECTED NOT DETECTED Final   Enterococcus Faecium NOT DETECTED NOT DETECTED Final   Listeria monocytogenes NOT DETECTED NOT DETECTED Final   Staphylococcus species NOT DETECTED NOT DETECTED Final   Staphylococcus aureus (BCID) NOT DETECTED NOT DETECTED Final   Staphylococcus epidermidis NOT DETECTED NOT DETECTED Final   Staphylococcus lugdunensis NOT DETECTED NOT DETECTED Final   Streptococcus species NOT DETECTED NOT DETECTED Final   Streptococcus agalactiae NOT DETECTED NOT DETECTED Final   Streptococcus pneumoniae NOT DETECTED NOT DETECTED Final   Streptococcus pyogenes NOT DETECTED NOT DETECTED Final   A.calcoaceticus-baumannii NOT DETECTED NOT DETECTED Final   Bacteroides fragilis NOT DETECTED NOT DETECTED Final   Enterobacterales DETECTED (A) NOT DETECTED Final    Comment: Enterobacterales represent a large order of gram negative bacteria, not a single organism. CRITICAL RESULT CALLED TO, READ BACK BY AND VERIFIED WITH: PHARMD FRANK WILSON 09/27/20'@00'$ :40 BY TW    Enterobacter cloacae complex NOT DETECTED NOT DETECTED Final   Escherichia coli NOT DETECTED NOT DETECTED  Final   Klebsiella aerogenes NOT DETECTED NOT DETECTED Final   Klebsiella oxytoca NOT DETECTED NOT DETECTED Final   Klebsiella pneumoniae NOT DETECTED NOT DETECTED Final   Proteus species NOT DETECTED NOT DETECTED Final   Salmonella species NOT DETECTED NOT DETECTED Final   Serratia marcescens DETECTED (A) NOT DETECTED Final  Comment: CRITICAL RESULT CALLED TO, READ BACK BY AND VERIFIED WITH: PHARMD FRANK WILSON 09/27/20'@00'$ :40 BY TW    Haemophilus influenzae NOT DETECTED NOT DETECTED Final   Neisseria meningitidis NOT DETECTED NOT DETECTED Final   Pseudomonas aeruginosa NOT DETECTED NOT DETECTED Final   Stenotrophomonas maltophilia NOT DETECTED NOT DETECTED Final   Candida albicans NOT DETECTED NOT DETECTED Final   Candida auris NOT DETECTED NOT DETECTED Final   Candida glabrata NOT DETECTED NOT DETECTED Final   Candida krusei NOT DETECTED NOT DETECTED Final   Candida parapsilosis NOT DETECTED NOT DETECTED Final   Candida tropicalis NOT DETECTED NOT DETECTED Final   Cryptococcus neoformans/gattii NOT DETECTED NOT DETECTED Final   CTX-M ESBL NOT DETECTED NOT DETECTED Final   Carbapenem resistance IMP NOT DETECTED NOT DETECTED Final   Carbapenem resistance KPC NOT DETECTED NOT DETECTED Final   Carbapenem resistance NDM NOT DETECTED NOT DETECTED Final   Carbapenem resist OXA 48 LIKE NOT DETECTED NOT DETECTED Final   Carbapenem resistance VIM NOT DETECTED NOT DETECTED Final    Comment: Performed at [facility], [address], [place], [place] [address]  Urine Culture     Status: None   Collection Time: 09/26/20 11:06 AM   Specimen: Urine, Catheterized  Result Value Ref Range Status   Specimen Description URINE, CATHETERIZED  Final   Special Requests NONE  Final   Culture   Final    NO GROWTH Performed at [facility], [address], [place], [place] [address]    Report Status 09/27/2020 FINAL  Final    Gaylan Gerold, [facility]ious [facility] [phone] pager    09/28/2020, 10:14 AM

## 2020-09-28 NOTE — Progress Notes (Signed)
Occupational Therapy Treatment Patient Details Name: Ricky Nelson MRN: [id number] DOB: 1935/11/23 Today's Date: 09/28/2020    History of present illness Pt is an 85 y/o male admitted to [facility] on 8/30 for L2-4 fusion with extension of hardware L2-L5. PMH significant for HTN, DM2, Bladder cancer, MI in 1995, Sleep apnea.   OT comments  Pt. Seen for skilled OT session with PT for continued efforts with adls and functional mobility.  Pt. Agreeable to participation.  Wife present and active in session as well. Bed mobility min guard a.  Assistance with sequencing for donning brace.  While in standing in attempts for ambulation pt.s legs buckled without warning and pt. Unable to correct.  Pt. Reports it "going black" before his legs give out.  This occurred 2x prior to assisting pt. Back to bed.  It is noted that pt. Also has notable b ue tremors and jerking movements prior to his legs giving out.  PT to notify md. Of these occurences as it happened in the previous session too.    Follow Up Recommendations  SNF;Supervision/Assistance - 24 hour    Equipment Recommendations  Other (comment)    Recommendations for Other Services      Precautions / Restrictions Precautions Precautions: Back;Fall Precaution Comments: Reviewed precautions verbally Required Braces or Orthoses: Spinal Brace Spinal Brace: Lumbar corset;Applied in sitting position Restrictions Weight Bearing Restrictions: No       Mobility Bed Mobility Overal bed mobility: Needs Assistance Bed Mobility: Rolling;Sidelying to Sit;Sit to Sidelying Rolling: Supervision Sidelying to sit: Min assist     Sit to sidelying: Min assist General bed mobility comments: minA for trunk elevation and returning LEs into bed-cues for log roll steps but able to complete while following back precautions    Transfers Overall transfer level: Needs assistance Equipment used: Rolling walker (2 wheeled) Transfers: Sit to/from Stand Sit to Stand:  Mod assist;+2 safety/equipment;+2 physical assistance         General transfer comment: unable to attempt mobility secondary to buckling without warning and unable to correct. pt. states "it goes black" it is in conunction with B large twitches and tremors of ues    Balance                                           ADL either performed or assessed with clinical judgement   ADL Overall ADL's : Needs assistance/impaired     Grooming: Oral care;Bed level;Maximal assistance Grooming Details (indicate cue type and reason): pt. has max. RPD that does not fit properly per pt. and spouse report.  pt. has pre cut paper towel piece that he 1st applies adhesvie to RPD then places paper towel piece on then applies more adhesive to the paper towel then places in mouth.  reviewed safety/choking hazard, both verbalize understanding. urge follow up at dds to eval and adj. as needed for proper fit         Upper Body Dressing : Minimal assistance;Sitting;Cueing for sequencing Upper Body Dressing Details (indicate cue type and reason): cues and intermittent assistance for proper donning of back brace Lower Body Dressing: Moderate assistance;Sitting/lateral leans;Sit to/from stand Lower Body Dressing Details (indicate cue type and reason): wife present reports he as never been able to cross each knee over, and prior to sx. would bend forward, states she will assist now, reviewed option for a/e, but it appears she  will be assisting at least initially while confusion present               General ADL Comments: mod a x2 for sit/stand, pt. unable to safely initiate ambulation secondary to severe buckling without warning, does not appear to be muscle fatigue related.  pt. describes "black" then he "just gives out".  this occured x2 while in standing beside bed     Vision       Perception     Praxis      Cognition Arousal/Alertness: Awake/alert Behavior During Therapy: WFL for  tasks assessed/performed Overall Cognitive Status: Impaired/Different from baseline Area of Impairment: Orientation;Memory;Following commands;Attention;Safety/judgement;Awareness;Problem solving                 Orientation Level: Disoriented to;Place;Situation Current Attention Level: Sustained Memory: Decreased recall of precautions;Decreased short-term memory Following Commands: Follows one step commands inconsistently Safety/Judgement: Decreased awareness of safety;Decreased awareness of deficits Awareness: Intellectual Problem Solving: Difficulty sequencing;Requires verbal cues;Requires tactile cues General Comments: able to state name, birthday took multiple attempts, states he is "near" the hospital but with max cues.  cont. to think he is at the [place]. (wife present and reports they love to go to the diner).  attempting to use call bell as a phone and thought he was talking to someone        Exercises     Shoulder Instructions       General Comments      Pertinent Vitals/ Pain       Pain Assessment: No/denies pain  Home Living                                          Prior Functioning/Environment              Frequency  Min 2X/week        Progress Toward Goals  OT Goals(current goals can now be found in the care plan section)  Progress towards OT goals: Progressing toward goals     Plan Frequency remains appropriate    Co-evaluation    PT/OT/SLP Co-Evaluation/Treatment: Yes Reason for Co-Treatment: For patient/therapist safety;To address functional/ADL transfers          AM-PAC OT "6 Clicks" Daily Activity     Outcome Measure   Help from another person eating meals?: None Help from another person taking care of personal grooming?: A Little Help from another person toileting, which includes using toliet, bedpan, or urinal?: A Little Help from another person bathing (including washing, rinsing, drying)?: A Lot Help from  another person to put on and taking off regular upper body clothing?: A Little Help from another person to put on and taking off regular lower body clothing?: A Lot 6 Click Score: 17    End of Session Equipment Utilized During Treatment: Rolling walker;Back brace  OT Visit Diagnosis: Unsteadiness on feet (R26.81);Muscle weakness (generalized) (M62.81);Other abnormalities of gait and mobility (R26.89);Pain   Activity Tolerance Other (comment) (limited with tremor/twitches and legs giving out)   Patient Left in bed;with call bell/phone within reach;with bed alarm set;with family/visitor present   Nurse Communication Other (comment) (PT to notify md of events that occured during session with legs giving out)        Time: [id number] OT Time Calculation (min): 23 min  Charges: OT General Charges $OT Visit: 1 Visit OT Treatments $Self Care/Home Management : 8-22 mins  Sonia Baller, COTA/L Acute Rehabilitation [phone]    Tanya Nones 09/28/2020, 11:50 AM

## 2020-09-28 NOTE — Progress Notes (Signed)
Physical Therapy Treatment Patient Details Name: Ricky Nelson MRN: [id number] DOB: Oct 16, 1935 Today's Date: 09/28/2020    History of Present Illness Pt is an 85 y/o male admitted to [facility] on 8/30 for L2-4 fusion with extension of hardware L2-L5. PMH significant for HTN, DM2, Bladder cancer, MI in 1995, Sleep apnea.    PT Comments    Session limited by involuntary jerking of bilateral UE and LE. Patient able to progress towards EOB with minA. Cues for log roll technique. Patient with involuntary jerking of bilateral knees resulting in buckling while standing which occurred x2 during session prior to returning patient back to bed. Patient reporting "going black" during episode. Patient with jerking of hands in sitting and supine. Notified MD. Continue to recommend SNF for ongoing Physical Therapy.      Follow Up Recommendations  SNF;Supervision/Assistance - 24 hour     Equipment Recommendations  Rolling Aijalon Kirtz with 5" wheels    Recommendations for Other Services       Precautions / Restrictions Precautions Precautions: Back;Fall Precaution Booklet Issued: Yes (comment) Precaution Comments: Reviewed precautions verbally Required Braces or Orthoses: Spinal Brace Spinal Brace: Lumbar corset;Applied in sitting position Restrictions Weight Bearing Restrictions: No    Mobility  Bed Mobility Overal bed mobility: Needs Assistance Bed Mobility: Rolling;Sidelying to Sit;Sit to Sidelying Rolling: Supervision Sidelying to sit: Min assist     Sit to sidelying: Min assist General bed mobility comments: minA for trunk elevation and returning LEs into bed-cues for log roll steps but able to complete while following back precautions    Transfers Overall transfer level: Needs assistance Equipment used: Rolling Jesusita Jocelyn (2 wheeled) Transfers: Sit to/from Stand Sit to Stand: Mod assist;+2 safety/equipment;+2 physical assistance         General transfer comment: unable to attempt  mobility secondary to buckling without warning and unable to correct. pt. states "it goes black" it is in conunction with B large twitches and tremors of UEs  Ambulation/Gait             General Gait Details: deferred due to involuntary quick buckling of knees   Stairs             Wheelchair Mobility    Modified Rankin (Stroke Patients Only)       Balance Overall balance assessment: Needs assistance Sitting-balance support: Bilateral upper extremity supported Sitting balance-Leahy Scale: Fair     Standing balance support: Bilateral upper extremity supported;During functional activity Standing balance-Leahy Scale: Poor Standing balance comment: reliant on UE support and external assist                            Cognition Arousal/Alertness: Awake/alert Behavior During Therapy: WFL for tasks assessed/performed Overall Cognitive Status: Impaired/Different from baseline Area of Impairment: Orientation;Memory;Following commands;Attention;Safety/judgement;Awareness;Problem solving                 Orientation Level: Disoriented to;Place;Situation Current Attention Level: Sustained Memory: Decreased recall of precautions;Decreased short-term memory Following Commands: Follows one step commands inconsistently Safety/Judgement: Decreased awareness of safety;Decreased awareness of deficits Awareness: Intellectual Problem Solving: Difficulty sequencing;Requires verbal cues;Requires tactile cues General Comments: able to state name, birthday took multiple attempts, states he is "near" the hospital but with max cues.  cont. to think he is at the [place]. (wife present and reports they love to go to the diner).  attempting to use call bell as a phone and thought he was talking to someone  Exercises General Exercises - Lower Extremity Mini-Sqauts: Both;5 reps;Standing    General Comments        Pertinent Vitals/Pain Pain Assessment: No/denies pain     Home Living                      Prior Function            PT Goals (current goals can now be found in the care plan section) Acute Rehab PT Goals Patient Stated Goal: did not state PT Goal Formulation: With patient/family Time For Goal Achievement: 10/03/20 Potential to Achieve Goals: Good Progress towards PT goals: Not progressing toward goals - comment    Frequency    Min 5X/week      PT Plan Current plan remains appropriate    Co-evaluation PT/OT/SLP Co-Evaluation/Treatment: Yes Reason for Co-Treatment: For patient/therapist safety;To address functional/ADL transfers PT goals addressed during session: Mobility/safety with mobility;Balance        AM-PAC PT "6 Clicks" Mobility   Outcome Measure  Help needed turning from your back to your side while in a flat bed without using bedrails?: A Little Help needed moving from lying on your back to sitting on the side of a flat bed without using bedrails?: A Little Help needed moving to and from a bed to a chair (including a wheelchair)?: A Little Help needed standing up from a chair using your arms (e.g., wheelchair or bedside chair)?: Total Help needed to walk in hospital room?: Total Help needed climbing 3-5 steps with a railing? : Total 6 Click Score: 12    End of Session Equipment Utilized During Treatment: Gait belt;Back brace Activity Tolerance: Patient tolerated treatment well Patient left: in bed;with call bell/phone within reach;with bed alarm set Nurse Communication: Mobility status PT Visit Diagnosis: Unsteadiness on feet (R26.81);Pain     Time: [id number] PT Time Calculation (min) (ACUTE ONLY): 23 min  Charges:  $Therapeutic Activity: 8-22 mins                     Deforest Maiden A. Gilford Rile PT, DPT Acute Rehabilitation Services Pager [phone] Office [phone]    Linna Hoff 09/28/2020, 4:45 PM

## 2020-09-28 NOTE — Evaluation (Signed)
Clinical/Bedside Swallow Evaluation Patient Details  Name: OSUALDO TAVIS MRN: [id number] Date of Birth: 1935/11/23  Today's Date: 09/28/2020 Time: SLP Start Time (ACUTE ONLY): 1004 SLP Stop Time (ACUTE ONLY): [id number] SLP Time Calculation (min) (ACUTE ONLY): 15 min  Past Medical History:  Past Medical History:  Diagnosis Date   Anemia    Anginal pain ([place])    2018; chronic stable angina 06/2020 (Dr. Atilano Median)   Cancer [facility])    bladder   CKD (chronic kidney disease)    Coronary artery disease    Diabetes mellitus without complication ([place])    type 2   Hypertension    Myocardial infarction ([place]) 1995   Pneumonia 1995   Sleep apnea 2012   Past Surgical History:  Past Surgical History:  Procedure Laterality Date   BACK SURGERY  2012   CAROTID ENDARTERECTOMY Right    right CEA ~ 2004, Dr. Heinz Knuckles, [place]  2012   CORONARY ARTERY BYPASS GRAFT  1995   EYE SURGERY Bilateral 2018   cataracts, since removed   HPI:  ENSON TURNER is a 85 y.o. male with medical history significant for CAD, T2DM, CKD stage IIIb, HTN, OSA on CPAP who was admitted under the neurosurgery service after undergoing L2-L4 decompression/fusion on 09/18/2020. Fever developed 9/6  with transient episodes of tachycardia and hypotension.  No clear infectious source.  CXR cardiomegaly with mild pulmonary interstitial edema and small right pleural effusion. Swallow ordered due to possible dysphagia.   Assessment / Plan / Recommendation Clinical Impression  Swallow assessment completed with wife at bedside who denies prior difficulty. Oral-motor is WFL's. He has upper extremity jerky movements and is oriented to self only. Aspiration from pharyngeal standpoint not concerning although suspect he may have esophageal involvement. Eructation, trunk extension, eyes watery. He states he has reflux and therapist reviewed precautions. Recommend continue thin, regular, esophageal precautions, pills with  thin and no further ST needed. SLP Visit Diagnosis: Dysphagia, unspecified (R13.10)    Aspiration Risk  Mild aspiration risk    Diet Recommendation Regular;Thin liquid   Liquid Administration via: Cup;Straw Medication Administration: Whole meds with liquid Supervision: Patient able to self feed Compensations: Slow rate;Small sips/bites Postural Changes: Remain upright for at least 30 minutes after po intake;Seated upright at 90 degrees    Other  Recommendations Oral Care Recommendations: Oral care BID   Follow up Recommendations None      Frequency and Duration            Prognosis        Swallow Study   General Date of Onset: 09/18/20 HPI: NORBERTO PROFFITT is a 85 y.o. male with medical history significant for CAD, T2DM, CKD stage IIIb, HTN, OSA on CPAP who was admitted under the neurosurgery service after undergoing L2-L4 decompression/fusion on 09/18/2020. Fever developed 9/6  with transient episodes of tachycardia and hypotension.  No clear infectious source.  CXR cardiomegaly with mild pulmonary interstitial edema and small right pleural effusion. Swallow ordered due to possible dysphagia. Type of Study: Bedside Swallow Evaluation Previous Swallow Assessment:  (no) Diet Prior to this Study: Regular;Thin liquids Temperature Spikes Noted: No Respiratory Status: Nasal cannula History of Recent Intubation: Yes (surgery 10 days ago) Length of Intubations (days):  (surgery) Behavior/Cognition: Alert;Cooperative;Pleasant mood;Confused;Requires cueing Oral Cavity Assessment: Other (comment) (pharyngeal candidias) Oral Care Completed by SLP: No Oral Cavity - Dentition: Dentures, top;Dentures, bottom Vision: Functional for self-feeding Self-Feeding Abilities: Needs assist (d/t jerky movements) Patient Positioning: Upright  in bed Baseline Vocal Quality: Normal Volitional Cough: Strong Volitional Swallow: Able to elicit    Oral/Motor/Sensory Function Overall Oral Motor/Sensory  Function: Within functional limits   Ice Chips Ice chips: Not tested   Thin Liquid Thin Liquid: Within functional limits Presentation: Cup;Straw    Nectar Thick Nectar Thick Liquid: Not tested   Honey Thick Honey Thick Liquid: Not tested   Puree Puree: Not tested   Solid     Solid: Within functional limits      Houston Siren 09/28/2020,10:52 AM Orbie Pyo Colvin Caroli.Ed Risk analyst [phone] Office [phone]

## 2020-09-28 NOTE — Progress Notes (Signed)
  NEUROSURGERY PROGRESS NOTE   No issues overnight. Pt reports mild back pain while laying in bed, otherwise no complaints.  EXAM:  BP (!) 166/88 (BP Location: Left Arm)   Pulse 90   Temp 97.6 F (36.4 C) (Oral)   Resp 19   Ht '5\' 9"'$  (1.753 m)   Wt 94.8 kg   SpO2 98%   BMI 30.86 kg/m   Awake, alert, oriented x3 but confused Speech fluent, appropriate  CN grossly intact  5/5 BUE/BLE  Wound with some serosanguinous drainage but not purulent. Minimal erythema.  IMPRESSION:  85 y.o. male s/p lumbar fusion with serratia bacteremia. Urine cx negative. ?Pneumonia as source. Wound appears ok.   -CKD appears at baseline -Postop anemia stabilized after previous transfusion -Mild hyponatremia  PLAN: - Cont cefepime.  - Appreciate medicine/ID input - Cont to mobilize with PT/OT   Consuella Lose, MD [facility]

## 2020-09-29 ENCOUNTER — Inpatient Hospital Stay (HOSPITAL_COMMUNITY): Payer: Medicare Other

## 2020-09-29 DIAGNOSIS — R6510 Systemic inflammatory response syndrome (SIRS) of non-infectious origin without acute organ dysfunction: Secondary | ICD-10-CM | POA: Diagnosis not present

## 2020-09-29 LAB — URINALYSIS, ROUTINE W REFLEX MICROSCOPIC
Bilirubin Urine: NEGATIVE
Glucose, UA: NEGATIVE mg/dL
Hgb urine dipstick: NEGATIVE
Ketones, ur: NEGATIVE mg/dL
Nitrite: NEGATIVE
Protein, ur: NEGATIVE mg/dL
Specific Gravity, Urine: 1.015 (ref 1.005–1.030)
pH: 5.5 (ref 5.0–8.0)

## 2020-09-29 LAB — COMPREHENSIVE METABOLIC PANEL
ALT: 42 U/L (ref 0–44)
AST: 48 U/L — ABNORMAL HIGH (ref 15–41)
Albumin: 2.4 g/dL — ABNORMAL LOW (ref 3.5–5.0)
Alkaline Phosphatase: 72 U/L (ref 38–126)
Anion gap: 11 (ref 5–15)
BUN: 26 mg/dL — ABNORMAL HIGH (ref 8–23)
CO2: 27 mmol/L (ref 22–32)
Calcium: 9.1 mg/dL (ref 8.9–10.3)
Chloride: 94 mmol/L — ABNORMAL LOW (ref 98–111)
Creatinine, Ser: 1.54 mg/dL — ABNORMAL HIGH (ref 0.61–1.24)
GFR, Estimated: 44 mL/min — ABNORMAL LOW (ref >60)
Glucose, Bld: 138 mg/dL — ABNORMAL HIGH (ref 70–99)
Potassium: 4.6 mmol/L (ref 3.5–5.1)
Sodium: 132 mmol/L — ABNORMAL LOW (ref 135–145)
Total Bilirubin: 0.8 mg/dL (ref 0.3–1.2)
Total Protein: 6.8 g/dL (ref 6.5–8.1)

## 2020-09-29 LAB — GLUCOSE, CAPILLARY
Glucose-Capillary: 159 mg/dL — ABNORMAL HIGH (ref 70–99)
Glucose-Capillary: 148 mg/dL — ABNORMAL HIGH (ref 70–99)
Glucose-Capillary: 225 mg/dL — ABNORMAL HIGH (ref 70–99)
Glucose-Capillary: 126 mg/dL — ABNORMAL HIGH (ref 70–99)

## 2020-09-29 LAB — CBC WITH DIFFERENTIAL/PLATELET
Abs Immature Granulocytes: 0.05 10*3/uL (ref 0.00–0.07)
Basophils Absolute: 0 10*3/uL (ref 0.0–0.1)
Basophils Relative: 0 %
Eosinophils Absolute: 0.2 10*3/uL (ref 0.0–0.5)
Eosinophils Relative: 2 %
HCT: 27.3 % — ABNORMAL LOW (ref 39.0–52.0)
Hemoglobin: 8.6 g/dL — ABNORMAL LOW (ref 13.0–17.0)
Immature Granulocytes: 1 %
Lymphocytes Relative: 10 %
Lymphs Abs: 0.7 10*3/uL (ref 0.7–4.0)
MCH: 29.8 pg (ref 26.0–34.0)
MCHC: 31.5 g/dL (ref 30.0–36.0)
MCV: 94.5 fL (ref 80.0–100.0)
Monocytes Absolute: 0.8 10*3/uL (ref 0.1–1.0)
Monocytes Relative: 11 %
Neutro Abs: 5.5 10*3/uL (ref 1.7–7.7)
Neutrophils Relative %: 76 %
Platelets: 181 10*3/uL (ref 150–400)
RBC: 2.89 MIL/uL — ABNORMAL LOW (ref 4.22–5.81)
RDW: 14.4 % (ref 11.5–15.5)
WBC: 7.3 10*3/uL (ref 4.0–10.5)
nRBC: 0 % (ref 0.0–0.2)

## 2020-09-29 LAB — URINALYSIS, MICROSCOPIC (REFLEX)

## 2020-09-29 LAB — PHOSPHORUS: Phosphorus: 4 mg/dL (ref 2.5–4.6)

## 2020-09-29 LAB — MAGNESIUM: Magnesium: 2.1 mg/dL (ref 1.7–2.4)

## 2020-09-29 LAB — BRAIN NATRIURETIC PEPTIDE: B Natriuretic Peptide: 831.5 pg/mL — ABNORMAL HIGH (ref 0.0–100.0)

## 2020-09-29 MED ORDER — LORAZEPAM 0.5 MG PO TABS
0.2500 mg | ORAL_TABLET | Freq: Every evening | ORAL | Status: DC | PRN
  Administered 2020-09-29 – 2020-10-02 (×4): 0.5 mg via ORAL
  Filled 2020-09-29 (×4): qty 1

## 2020-09-29 MED ORDER — MELATONIN 3 MG PO TABS
3.0000 mg | ORAL_TABLET | Freq: Every day | ORAL | Status: DC
  Administered 2020-09-29 – 2020-10-04 (×6): 3 mg via ORAL
  Filled 2020-09-29 (×6): qty 1

## 2020-09-29 MED ORDER — FUROSEMIDE 10 MG/ML IJ SOLN: 40.0000 mg | Freq: Once | INTRAMUSCULAR | Status: DC

## 2020-09-29 MED ORDER — VANCOMYCIN HCL 2000 MG/400ML IV SOLN
2000.0000 mg | Freq: Once | INTRAVENOUS | Status: AC
  Administered 2020-09-29: 2000 mg via INTRAVENOUS
  Filled 2020-09-29: qty 400

## 2020-09-29 MED ORDER — INFLUENZA VAC A&B SA ADJ QUAD 0.5 ML IM PRSY
0.5000 mL | PREFILLED_SYRINGE | INTRAMUSCULAR | Status: DC
  Filled 2020-09-29: qty 0.5

## 2020-09-29 MED ORDER — VANCOMYCIN HCL 750 MG/150ML IV SOLN
750.0000 mg | INTRAVENOUS | Status: DC
  Filled 2020-09-29: qty 150

## 2020-09-29 MED ORDER — TRAZODONE HCL 50 MG PO TABS: 50.0000 mg | ORAL_TABLET | Freq: Every day | ORAL | Status: DC

## 2020-09-29 MED ORDER — FUROSEMIDE 10 MG/ML IJ SOLN
40.0000 mg | Freq: Two times a day (BID) | INTRAMUSCULAR | Status: AC
  Administered 2020-09-29: 40 mg via INTRAVENOUS
  Filled 2020-09-29 (×3): qty 4

## 2020-09-29 NOTE — Progress Notes (Signed)
Neurosurgery Service Progress Note  Subjective: No acute events overnight, per nursing pt's having altered sleep cycles and some confusion, no complaints this morning  Objective: Vitals:   09/29/20 0454 09/29/20 0719 09/29/20 0819 09/29/20 0848  BP:  (!) 158/83    Pulse:  89    Resp:  13    Temp:  99.2 F (37.3 C)    TempSrc:  Axillary Oral   SpO2:  97%  93%  Weight: 93.3 kg     Height:        Physical Exam: Somnolent but eyes open to voice, Ox2, PERRL, EOMI, FS, TM, Strength 5/5 x4, SILTx4, Fcx4, speech fluent but mildly dysarthric Wound w/ some mild erythema and scant areas of drainage with some small areas of dehiscence  Assessment & Plan: 85 y.o. man s/p lumbar fusion w/ delayed post-op bacteremia / sepsis / confusion.  -cont honeycomb to catch any drainage, discussed w/ the pt's wife that we'll have to watch the wound closely to see which direction it goes  -medicine recs / ID recs  Judith Part  09/29/20 10:22 AM

## 2020-09-29 NOTE — Plan of Care (Signed)
  Problem: Education: Goal: Ability to verbalize activity precautions or restrictions will improve Outcome: Progressing Goal: Knowledge of the prescribed therapeutic regimen will improve Outcome: Progressing Goal: Understanding of discharge needs will improve Outcome: Progressing   Problem: Activity: Goal: Ability to avoid complications of mobility impairment will improve Outcome: Progressing Goal: Ability to tolerate increased activity will improve Outcome: Progressing Goal: Will remain free from falls Outcome: Progressing   Problem: Bowel/Gastric: Goal: Gastrointestinal status for postoperative course will improve Outcome: Progressing   Problem: Clinical Measurements: Goal: Ability to maintain clinical measurements within normal limits will improve Outcome: Progressing Goal: Postoperative complications will be avoided or minimized Outcome: Progressing Goal: Diagnostic test results will improve Outcome: Progressing   Problem: Pain Management: Goal: Pain level will decrease Outcome: Progressing   Problem: Skin Integrity: Goal: Will show signs of wound healing Outcome: Progressing   Problem: Health Behavior/Discharge Planning: Goal: Identification of resources available to assist in meeting health care needs will improve Outcome: Progressing   Problem: Bladder/Genitourinary: Goal: Urinary functional status for postoperative course will improve Outcome: Progressing   Problem: Safety: Goal: Ability to remain free from injury will improve Outcome: Progressing   Problem: Safety: Goal: Non-violent Restraint(s) Outcome: Progressing

## 2020-09-29 NOTE — Progress Notes (Signed)
Pharmacy Antibiotic Note  Ricky Nelson is a 85 y.o. male admitted on 09/18/2020 s/p lumbar fusion. Pharmacy has been consulted for Cefepime dosing for serratia bacteremia and now adding back vancomycin for wound infection per Neurosurgery. Patient was given loading dose only of vancomycin on 9/7 prior to d/c. SCr down to 1.54.  Plan: Cefepime 2g IV q12h Vancomycin 2g IV now, then '750mg'$  IV q24h Goal AUC 400-550. Expected AUC: 465. SCr used: 1.54, Vd coeff 0.5 Monitor clinical progress, c/s, renal function F/u de-escalation plan/LOT, vancomycin levels as indicated   Height: '5\' 9"'$  (175.3 cm) Weight: 93.3 kg (205 lb 11 oz) IBW/kg (Calculated) : 70.7  Temp (24hrs), Avg:98.6 F (37 C), Min:97.7 F (36.5 C), Max:99.9 F (37.7 C)  Recent Labs  Lab 09/25/20 0243 09/25/20 2146 09/26/20 0201 09/26/20 0213 09/26/20 0443 09/27/20 0352 09/28/20 0151 09/29/20 0228  WBC 7.6 17.6* 13.5*  --   --  6.5 7.4 7.3  CREATININE 1.87*  --  2.08*  --   --  1.84* 1.73* 1.54*  LATICACIDVEN  --   --   --  1.5 1.0  --   --   --      Estimated Creatinine Clearance: 41 mL/min (A) (by C-G formula based on SCr of 1.54 mg/dL (H)).    Allergies  Allergen Reactions   Allergy Relief [Chlorpheniramine Maleate] Shortness Of Breath   Diazepam     Other reaction(s): Hypotension, Low blood pressure, Other (See Comments)   Diphenhydramine Hcl Shortness Of Breath    Other reaction(s): Other (See Comments), Stopped breathing Other reaction(s): Shortness Of Breath Shortness of breath with benadryl    Diphenhydramine-Zinc Acetate Shortness Of Breath   Atorvastatin Other (See Comments)    Other reaction(s): Muscle pain, Other (See Comments), Unknown Elevated CK      Fluvastatin Other (See Comments)    Other reaction(s): Other (See Comments) myalgias   Lisinopril     Other reaction(s): Hyperkalemia, Other (See Comments)   Metformin And Related     Other reaction(s): Other Renal function    Piperacillin-Tazobactam In Dex Rash    Other reaction(s): Rash Other reaction(s): Rash    Diphenhydramine     Other reaction(s): Low blood pressure   Other Other (See Comments)    [place]    Arturo Morton, PharmD, BCPS Please check AMION for all [place] contact numbers Clinical Pharmacist 09/29/2020 11:44 AM

## 2020-09-29 NOTE — Progress Notes (Signed)
PT somewhat disoriented and in rist restraints, unable to place on cpap for the night.

## 2020-09-29 NOTE — Progress Notes (Addendum)
Consult NOTE    Ricky Nelson  [id number] DOB: Apr 09, 1935 DOA: 09/18/2020 PCP: Pcp, No   No chief complaint on file.  Brief Narrative:  Ricky Nelson is Ricky Nelson 85 y.o. male with medical history significant for CAD, T2DM, CKD stage IIIb, HTN, OSA on CPAP who was admitted under the neurosurgery service after undergoing L2-L4 decompression/fusion on 09/18/2020.  Patient had new fever developing 9/6  with transient episodes of tachycardia and hypotension.  No clear infectious source.  Surgical site appears clean.  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  Blood cultures and urine cultures were ordered but still pending collection.  Assessment & Plan:   Active Problems:   Lumbar spinal stenosis   Sleep apnea   Diabetes mellitus without complication (HCC)   Coronary artery disease   CKD (chronic kidney disease)   SIRS (systemic inflammatory response syndrome) (HCC)   Hypoxia   Pleural effusion  Sepsis 2/2 Serratia Marcescens Bacteremia: Fever with T-max 103.7 F rectally on 9/6. Also with reported rigors. associated tachycardia, tachypnea, transient hypotension Now with 1/2 sets blood cultures showing serratia Repeat blood cultures from 9/8 pending  Temp 99.9 on 9/9 Source not clear -> urinary vs pulmonary - post op site without si/sx infection per nsgy  Urine cx no growth (after abx) - UA is pending CXR notable for mild interstitial edema and R pleural effusion COVID and influenza negative.   Narrow to cefepime for now, though with concerns for wound infection, after discussion with NSGY, broadening back to cefepime/vanc ID c/s, appreciate recommendations - recommending 7 days abx (may need to discuss again with ID pending progression of concern for wound infection)  Acute Hypoxic Respiratory Failure  History of COPD  Mild Pulmonary Edema   Heart Failure Exacerbation - could be related to COPD (though not chronically on O2), small R effusion on CT scan could represent pna - suspect this is volume  overload from heart failure +/- pneumonia (interesting that he's net negative and seems to be autodiuresing with downtrending weights, but will trial lasix) - currently on 3-4 L - CXR with cardiomegaly and mild pulm edema with small R effusion - mild wheeze noted today - transmitted upper airway sounds? Schedule nebs. - repeat CXR 9/10 -> similar, moderate CHF, increased R and developing L base airspace disease, likely atelectasis - follow echo -> EF 50-55%, inferolateral hypokinesis, grade II diastolic dysfunction - moderately elevated PASP, mildly reduced RVSF - strict I/O, daily weights (he's net negative, weights downtrending) - hold additional IVF for now, consider lasix (though he's net negative 10.7 L at this time and weight is down for admission - doesn't look significantly overloaded on exam) - wean as tolerated, w/u further as indicated - SLP eval   Acute encephalopathy: Persistent today Likely 2/2 above ammonia (wnl).  Follow B12, folate, TSH (mildly elevated).  VBG without hypercarbia. Minimize sedating medications, hold narcotics, Robaxin.  Hold gabapentin for now. delirium precautions - will allow family to stay at bedside overnight Follow EKG for QT eval - consider seroquel (plan for trazodone right now) addendum - qt prolonged, hold qt prolonging agens -> trial melatonin   Hyponatremia: With Ricky Nelson normal serum osm, will continue to monitor Urine sodium 83 Follow with diuresis   HFpEF  Inferolateral Hypokinesis Diuresis as noted above Continue metoprolol Consider cardiology for wall motion abnormality - unclear chronicity, no prior echos  ?Presyncope C/o vision going black and tremors/jerking when standing with therapy Needs additional f/u, monitoring Orthostatics when able based on his mental status  S/p  L2-L4 decompression/fusion 09/18/2020: Per neurosurgery. Wound with mild erythema and scant drainage with small areas of dehiscence - discussed with Dr. Zada Finders,  will rebroaden abx while we monitor wound   CKD stage IIIb: Creatinine 2.46 at presentation Baseline unclear, as low as 1.58 here Fluctuating and now 1.54 Renal US (without evidence hydro - ? Bladder diverticulum), UA (pending) Hold additional IVF - net negative here - CXR appears overloaded, follow with lasix   Type 2 diabetes: Hold home glipizide, Jardiance, Victoza and reduce Levemir  Adjust insulin as needed, continue SSI    CAD: Plavix has been held in the perioperative period.  Continue Toprol-XL and Imdur as BP allows.   Iron deficiency anemia: S/p 2 unit PRBC for acute blood loss anemia (9/2 and 9/3)   Hypertension: Continue Toprol-XL and Imdur as BP allows.   OSA: Continue CPAP nightly.  Bladder Diverticulum Needs follow up with dedicated CT or [place] as allowed  DVT prophylaxis: SCD Code Status: full  Family Communication: wife at bedside Disposition:   Per [name]       Consultants:  [facility]   Procedures: PROCEDURE: 1. L2, L3 laminectomy with facetectomy for decompression of exiting nerve roots, more than would be required for placement of interbody graft 2. Placement of anterior interbody device - Medtronic epandable 54m cage x4 3. Posterior segmental instrumentation using Medtronic Solera pedicle screws and previously place Stryker D90 screws, L2 - L5 4. Interbody arthrodesis, L2-3, L3-4 5. Use of locally harvested bone autograft 6. Use of non-structural bone allograft - ProteOs 7. Removal of previous rod, L4-5  Antimicrobials: Anti-infectives (From admission, onward)    Start     Dose/Rate Route Frequency Ordered Stop   09/28/20 0200  vancomycin (VANCOREADY) IVPB 1500 mg/300 mL  Status:  Discontinued        1,500 mg 150 mL/hr over 120 Minutes Intravenous Every 48 hours 09/26/20 0035 09/27/20 1106   09/26/20 1400  ceFEPIme (MAXIPIME) 2 g in sodium chloride 0.9 % 100 mL IVPB        2 g 200 mL/hr over 30 Minutes Intravenous Every 12 hours  09/26/20 0035 10/02/20 2359   09/26/20 0130  vancomycin (VANCOREADY) IVPB 2000 mg/400 mL        2,000 mg 200 mL/hr over 120 Minutes Intravenous STAT 09/26/20 0035 09/26/20 0854   09/26/20 0100  ceFEPIme (MAXIPIME) 2 g in sodium chloride 0.9 % 100 mL IVPB        2 g 200 mL/hr over 30 Minutes Intravenous STAT 09/26/20 0035 09/26/20 0852   09/18/20 2300  ceFAZolin (ANCEF) IVPB 2g/100 mL premix        2 g 200 mL/hr over 30 Minutes Intravenous Every 8 hours 09/18/20 1852 09/19/20 0633   09/18/20 0930  ceFAZolin (ANCEF) IVPB 2g/100 mL premix        2 g 200 mL/hr over 30 Minutes Intravenous On call to O.R. 09/18/20 0925 09/18/20 1520          Subjective: Continues to be confused  Issues sleeping at night  Objective: Vitals:   09/29/20 0454 09/29/20 0719 09/29/20 0819 09/29/20 0848  BP:  (!) 158/83    Pulse:  89    Resp:  13    Temp:  99.2 F (37.3 C)    TempSrc:  Axillary Oral   SpO2:  97%  93%  Weight: 93.3 kg     Height:        Intake/Output Summary (Last 24 hours) at 09/29/2020 1101 Last data  filed at 09/29/2020 0337 Gross per 24 hour  Intake --  Output 2550 ml  Net -2550 ml   Filed Weights   09/18/20 0928 09/28/20 0452 09/29/20 0454  Weight: 98.2 kg 94.8 kg 93.3 kg    Examination:  General: No acute distress. Cardiovascular: RRR Lungs: distant lung sounds, on 3-4 L  Abdomen: Soft, nontender, nondistended  Neurological: Confused, lethargic. Moves all extremities 4 . Cranial nerves II through XII grossly intact. Skin: dressing intact to lower back surgical site - per discussion and nsgy note, some mild erythema and scant drainage with small areas of dehiscence Extremities: No clubbing or cyanosis. No LE edema, some trace dependent edmea to hip     Data Reviewed: I have personally reviewed following labs and imaging studies  CBC: Recent Labs  Lab 09/25/20 2146 09/26/20 0201 09/27/20 0352 09/28/20 0151 09/29/20 0228  WBC 17.6* 13.5* 6.5 7.4 7.3   NEUTROABS 15.7*  --  5.0  --  5.5  HGB 9.0* 8.7* 8.0* 8.4* 8.6*  HCT 28.6* 28.0* 25.1* 26.2* 27.3*  MCV 96.9 97.6 95.1 94.9 94.5  PLT 206 201 171 174 0000000    Basic Metabolic Panel: Recent Labs  Lab 09/25/20 0243 09/26/20 0201 09/26/20 1151 09/27/20 0352 09/28/20 0151 09/28/20 1209 09/29/20 0228  NA 130* 130*  --  129* 129*  --  132*  K 4.6 5.5* 4.8 4.4 4.8  --  4.6  CL 95* 94*  --  93* 94*  --  94*  CO2 26 26  --  26 26  --  27  GLUCOSE 115* 176*  --  154* 157*  --  138*  BUN 36* 41*  --  37* 31*  --  26*  CREATININE 1.87* 2.08*  --  1.84* 1.73*  --  1.54*  CALCIUM 8.7* 8.6*  --  9.1 8.7*  --  9.1  MG  --   --   --  2.2  --  2.3 2.1  PHOS  --   --   --  4.2  --  4.0 4.0    GFR: Estimated Creatinine Clearance: 41 mL/min (Ricky Nelson) (by C-G formula based on SCr of 1.54 mg/dL (H)).  Liver Function Tests: Recent Labs  Lab 09/27/20 0352 09/29/20 0228  AST 80* 48*  ALT 41 42  ALKPHOS 68 72  BILITOT 0.4 0.8  PROT 6.4* 6.8  ALBUMIN 2.2* 2.4*    CBG: Recent Labs  Lab 09/28/20 0738 09/28/20 1215 09/28/20 1606 09/28/20 2102 09/29/20 0830  GLUCAP 147* 160* 85 175* 159*     Recent Results (from the past 240 hour(s))  Resp Panel by RT-PCR (Flu Ricky Nelson&B, Covid) Nasopharyngeal Swab     Status: None   Collection Time: 09/25/20 12:44 PM   Specimen: Nasopharyngeal Swab; Nasopharyngeal(NP) swabs in vial transport medium  Result Value Ref Range Status   SARS Coronavirus 2 by RT PCR NEGATIVE NEGATIVE Final    Comment: (NOTE) SARS-CoV-2 target nucleic acids are NOT DETECTED.  The SARS-CoV-2 RNA is generally detectable in upper respiratory specimens during the acute phase of infection. The lowest concentration of SARS-CoV-2 viral copies this assay can detect is 138 copies/mL. Ricky Nelson negative result does not preclude SARS-Cov-2 infection and should not be used as the sole basis for treatment or other patient management decisions. Ricky Nelson negative result may occur with  improper specimen  collection/handling, submission of specimen other than nasopharyngeal swab, presence of viral mutation(s) within the areas targeted by this assay, and inadequate number of viral copies(<138  copies/mL). Ricky Nelson negative result must be combined with clinical observations, patient history, and epidemiological information. The expected result is Negative.  Fact Sheet for Patients:  EntrepreneurPulse.com.au  Fact Sheet for Healthcare Providers:  IncredibleEmployment.be  This test is no t yet approved or cleared by the [place] FDA and  has been authorized for detection and/or diagnosis of SARS-CoV-2 by FDA under an Emergency Use Authorization (EUA). This EUA will remain  in effect (meaning this test can be used) for the duration of the COVID-19 declaration under Section 564(b)(1) of the Act, 21 U.S.C.section 360bbb-3(b)(1), unless the authorization is terminated  or revoked sooner.       Influenza Quinn[place] Calvin by PCR NEGATIVE NEGATIVE Final   Influenza B by PCR NEGATIVE NEGATIVE Final    Comment: (NOTE) The Xpert Xpress SARS-CoV-2/FLU/RSV plus assay is intended as an aid in the diagnosis of influenza from Nasopharyngeal swab specimens and should not be used as Ricky Nelson sole basis for treatment. Nasal washings and aspirates are unacceptable for Xpert Xpress SARS-CoV-2/FLU/RSV testing.  Fact Sheet for Patients: EntrepreneurPulse.com.au  Fact Sheet for Healthcare Providers: IncredibleEmployment.be  This test is not yet approved or cleared by the [place] FDA and has been authorized for detection and/or diagnosis of SARS-CoV-2 by FDA under an Emergency Use Authorization (EUA). This EUA will remain in effect (meaning this test can be used) for the duration of the COVID-19 declaration under Section 564(b)(1) of the Act, 21 U.S.C. section 360bbb-3(b)(1), unless the authorization is terminated or revoked.  Performed at [facility], [place] [address], [place], [place] [address]   Culture, blood (routine x 2)     Status: Abnormal   Collection Time: 09/25/20  9:46 PM   Specimen: BLOOD LEFT HAND  Result Value Ref Range Status   Specimen Description BLOOD LEFT HAND  Final   Special Requests AEROBIC BOTTLE ONLY Blood Culture adequate volume  Final   Culture  Setup Time   Final    GRAM NEGATIVE RODS AEROBIC BOTTLE ONLY CRITICAL RESULT CALLED TO, READ BACK BY AND VERIFIED WITH: PHARMD FRANK WILSON 09/27/20'@00'$ :40 BY TW Performed at [facility], [place] [address], [place], [place] [address]    Culture SERRATIA MARCESCENS (Ricky Nelson)  Final   Report Status 09/28/2020 FINAL  Final   Organism ID, Bacteria SERRATIA MARCESCENS  Final      Susceptibility   Serratia marcescens - MIC*    CEFAZOLIN >=64 RESISTANT Resistant     CEFEPIME <=0.12 SENSITIVE Sensitive     CEFTAZIDIME <=1 SENSITIVE Sensitive     CEFTRIAXONE <=0.25 SENSITIVE Sensitive     CIPROFLOXACIN <=0.25 SENSITIVE Sensitive     GENTAMICIN <=1 SENSITIVE Sensitive     TRIMETH/SULFA <=20 SENSITIVE Sensitive     * SERRATIA MARCESCENS  Culture, blood (routine x 2)     Status: None (Preliminary result)   Collection Time: 09/25/20  9:46 PM   Specimen: BLOOD RIGHT HAND  Result Value Ref Range Status   Specimen Description BLOOD RIGHT HAND  Final   Special Requests AEROBIC BOTTLE ONLY Blood Culture adequate volume  Final   Culture   Final    NO GROWTH 3 DAYS Performed at [facility], [address], [place], [place] [address]    Report Status PENDING  Incomplete  Blood Culture ID Panel (Reflexed)     Status: Abnormal   Collection Time: 09/25/20  9:46 PM  Result Value Ref Range Status   Enterococcus faecalis NOT DETECTED NOT DETECTED Final  Enterococcus Faecium NOT DETECTED NOT DETECTED Final   Listeria monocytogenes NOT DETECTED NOT DETECTED Final   Staphylococcus species NOT DETECTED NOT DETECTED Final   Staphylococcus aureus (BCID) NOT DETECTED  NOT DETECTED Final   Staphylococcus epidermidis NOT DETECTED NOT DETECTED Final   Staphylococcus lugdunensis NOT DETECTED NOT DETECTED Final   Streptococcus species NOT DETECTED NOT DETECTED Final   Streptococcus agalactiae NOT DETECTED NOT DETECTED Final   Streptococcus pneumoniae NOT DETECTED NOT DETECTED Final   Streptococcus pyogenes NOT DETECTED NOT DETECTED Final   Phill Steck.calcoaceticus-baumannii NOT DETECTED NOT DETECTED Final   Bacteroides fragilis NOT DETECTED NOT DETECTED Final   Enterobacterales DETECTED (Kevante Lunt) NOT DETECTED Final    Comment: Enterobacterales represent Staley Budzinski large order of gram negative bacteria, not Ricky Nelson single organism. CRITICAL RESULT CALLED TO, READ BACK BY AND VERIFIED WITH: PHARMD FRANK WILSON 09/27/20'@00'$ :40 BY TW    Enterobacter cloacae complex NOT DETECTED NOT DETECTED Final   Escherichia coli NOT DETECTED NOT DETECTED Final   Klebsiella aerogenes NOT DETECTED NOT DETECTED Final   Klebsiella oxytoca NOT DETECTED NOT DETECTED Final   Klebsiella pneumoniae NOT DETECTED NOT DETECTED Final   Proteus species NOT DETECTED NOT DETECTED Final   Salmonella species NOT DETECTED NOT DETECTED Final   Serratia marcescens DETECTED (Ricky Nelson) NOT DETECTED Final    Comment: CRITICAL RESULT CALLED TO, READ BACK BY AND VERIFIED WITH: PHARMD FRANK WILSON 09/27/20'@00'$ :40 BY TW    Haemophilus influenzae NOT DETECTED NOT DETECTED Final   Neisseria meningitidis NOT DETECTED NOT DETECTED Final   Pseudomonas aeruginosa NOT DETECTED NOT DETECTED Final   Stenotrophomonas maltophilia NOT DETECTED NOT DETECTED Final   Candida albicans NOT DETECTED NOT DETECTED Final   Candida auris NOT DETECTED NOT DETECTED Final   Candida glabrata NOT DETECTED NOT DETECTED Final   Candida krusei NOT DETECTED NOT DETECTED Final   Candida parapsilosis NOT DETECTED NOT DETECTED Final   Candida tropicalis NOT DETECTED NOT DETECTED Final   Cryptococcus neoformans/gattii NOT DETECTED NOT DETECTED Final   CTX-M ESBL NOT  DETECTED NOT DETECTED Final   Carbapenem resistance IMP NOT DETECTED NOT DETECTED Final   Carbapenem resistance KPC NOT DETECTED NOT DETECTED Final   Carbapenem resistance NDM NOT DETECTED NOT DETECTED Final   Carbapenem resist OXA 48 LIKE NOT DETECTED NOT DETECTED Final   Carbapenem resistance VIM NOT DETECTED NOT DETECTED Final    Comment: Performed at [facility], [address], [place], [place] [address]  Urine Culture     Status: None   Collection Time: 09/26/20 11:06 AM   Specimen: Urine, Catheterized  Result Value Ref Range Status   Specimen Description URINE, CATHETERIZED  Final   Special Requests NONE  Final   Culture   Final    NO GROWTH Performed at [facility], [address] [place], [place] [address]    Report Status 09/27/2020 FINAL  Final  Culture, blood (routine x 2)     Status: None (Preliminary result)   Collection Time: 09/27/20  8:17 AM   Specimen: BLOOD  Result Value Ref Range Status   Specimen Description BLOOD LEFT ANTECUBITAL  Final   Special Requests   Final    BOTTLES DRAWN AEROBIC AND ANAEROBIC Blood Culture adequate volume   Culture   Final    NO GROWTH 1 DAY Performed at [facility], [place] [address] [place], [place] [address]    Report Status PENDING  Incomplete  Culture, blood (routine x 2)  Status: None (Preliminary result)   Collection Time: 09/27/20  8:19 AM   Specimen: BLOOD  Result Value Ref Range Status   Specimen Description BLOOD BLOOD RIGHT HAND  Final   Special Requests   Final    BOTTLES DRAWN AEROBIC AND ANAEROBIC Blood Culture adequate volume   Culture   Final    NO GROWTH 1 DAY Performed at [facility], [address], [place], [place] [address]    Report Status PENDING  Incomplete         Radiology Studies: DG CHEST PORT 1 VIEW  Result Date: 09/29/2020 CLINICAL DATA:  Hypoxia EXAM: PORTABLE CHEST 1 VIEW COMPARISON:  09/27/2020 FINDINGS: Prior median sternotomy. Midline trachea. Mild  cardiomegaly. Atherosclerosis in the transverse aorta. No pleural effusion or pneumothorax. Interstitial prominence and indistinctness persists. Increase right and developing left base airspace disease. IMPRESSION: Moderate congestive heart failure, similar. Increased right and developing left base Airspace disease, likely atelectasis. Aortic Atherosclerosis (ICD10-I70.0). Electronically Signed   By: Ricky Nelson M.D.   On: 09/29/2020 10:00   ECHOCARDIOGRAM COMPLETE  Result Date: 09/28/2020    ECHOCARDIOGRAM REPORT   Patient Name:   KELL ELPERS Date of Exam: 09/28/2020 Medical Rec #:  [id number]       Height:       69.0 in Accession #:    [id number]      Weight:       209.0 lb Date of Birth:  1935-10-12       BSA:          2.105 m Patient Age:    57 years        BP:           133/70 mmHg Patient Gender: M               HR:           79 bpm. Exam Location:  Inpatient Procedure: 2D Echo and Intracardiac Opacification Agent Indications:    edema  History:        Patient has no prior history of Echocardiogram examinations.                 CAD, chronic kidney disease; Risk Factors:Diabetes and Sleep                 Apnea.  Sonographer:    Ricky Nelson RDCS Referring Phys: [phone] Da Michelle CALDWELL POWELL JR  Sonographer Comments: Image acquisition challenging due to uncooperative patient and Image acquisition challenging due to respiratory motion. IMPRESSIONS  1. Technically difficult study. Left ventricular ejection fraction, by estimation, is 50 to 55%. The left ventricle has low normal function. The left ventricle demonstrates regional wall motion abnormalities. Inferolateral hypoinesis. There is mild left  ventricular hypertrophy. Left ventricular diastolic parameters are consistent with Grade II diastolic dysfunction (pseudonormalization). Elevated left atrial pressure.  2. Right ventricular systolic function is mildly reduced. The right ventricular size is normal. There is moderately elevated pulmonary artery  systolic pressure.  3. Left atrial size was severely dilated.  4. The mitral valve is normal in structure. Mild mitral valve regurgitation.  5. The tricuspid valve is abnormal. Tricuspid valve regurgitation is moderate.  6. The aortic valve was not well visualized. Aortic valve regurgitation is not visualized. Mild to moderate aortic valve sclerosis/calcification is present, without any evidence of aortic stenosis.  7. The inferior vena cava is dilated in size with >50% respiratory variability, suggesting right atrial pressure of 8 mmHg. FINDINGS  Left  Ventricle: Left ventricular ejection fraction, by estimation, is 50 to 55%. The left ventricle has low normal function. The left ventricle demonstrates regional wall motion abnormalities. Definity contrast agent was given IV to delineate the left ventricular endocardial borders. The left ventricular internal cavity size was normal in size. There is mild left ventricular hypertrophy. Left ventricular diastolic parameters are consistent with Grade II diastolic dysfunction (pseudonormalization). Elevated left atrial pressure. Right Ventricle: The right ventricular size is normal. No increase in right ventricular wall thickness. Right ventricular systolic function is mildly reduced. There is moderately elevated pulmonary artery systolic pressure. The tricuspid regurgitant velocity is 3.09 m/s, and with an assumed right atrial pressure of 8 mmHg, the estimated right ventricular systolic pressure is [id number] mmHg. Left Atrium: Left atrial size was severely dilated. Right Atrium: Right atrial size was normal in size. Pericardium: There is no evidence of pericardial effusion. Mitral Valve: The mitral valve is normal in structure. Mild mitral valve regurgitation. Tricuspid Valve: The tricuspid valve is abnormal. Tricuspid valve regurgitation is moderate. Aortic Valve: The aortic valve was not well visualized. Aortic valve regurgitation is not visualized. Mild to moderate aortic  valve sclerosis/calcification is present, without any evidence of aortic stenosis. Pulmonic Valve: The pulmonic valve was normal in structure. Pulmonic valve regurgitation is not visualized. Aorta: The aortic root and ascending aorta are structurally normal, with no evidence of dilitation. Venous: The inferior vena cava is dilated in size with greater than 50% respiratory variability, suggesting right atrial pressure of 8 mmHg. IAS/Shunts: The interatrial septum was not well visualized.  LEFT VENTRICLE PLAX 2D LVIDd:         4.80 cm  Diastology LVIDs:         3.70 cm  LV e' medial:    6.74 cm/s LV PW:         1.30 cm  LV E/e' medial:  12.7 LV IVS:        1.30 cm  LV e' lateral:   8.81 cm/s LVOT diam:     2.00 cm  LV E/e' lateral: 9.7 LV SV:         42 LV SV Index:   20 LVOT Area:     3.14 cm  RIGHT VENTRICLE            IVC RV S prime:     9.46 cm/s  IVC diam: 2.30 cm TAPSE (M-mode): 1.4 cm LEFT ATRIUM              Index       RIGHT ATRIUM           Index LA diam:        4.50 cm  2.14 cm/m  RA Area:     20.00 cm LA Vol (A2C):   83.3 ml  39.57 ml/m RA Volume:   57.50 ml  27.32 ml/m LA Vol (A4C):   121.0 ml 57.48 ml/m LA Biplane Vol: 104.0 ml 49.41 ml/m  AORTIC VALVE LVOT Vmax:   63.75 cm/s LVOT Vmean:  41.500 cm/s LVOT VTI:    0.132 m  AORTA Ao Root diam: 3.10 cm Ao Asc diam:  3.60 cm MITRAL VALVE               TRICUSPID VALVE MV Area (PHT): 3.99 cm    TR Peak grad:   38.2 mmHg MV Decel Time: 190 msec    TR Vmax:        309.00 cm/s MV E velocity: 85.30 cm/s MV Giovannina Mun velocity:  57.40 cm/s  SHUNTS MV E/Nahome Bublitz ratio:  1.49        Systemic VTI:  0.13 m                            Systemic Diam: 2.00 cm Oswaldo Milian MD Electronically signed by Oswaldo Milian MD Signature Date/Time: 09/28/2020/8:38:09 PM    Final         Scheduled Meds:  buPROPion  150 mg Oral q morning   Chlorhexidine Gluconate Cloth  6 each Topical Daily   docusate sodium  100 mg Oral BID   DULoxetine  60 mg Oral Daily   feeding  supplement (GLUCERNA SHAKE)  237 mL Oral TID BM   furosemide  40 mg Intravenous BID   insulin aspart  0-20 Units Subcutaneous TID WC   insulin detemir  20 Units Subcutaneous Q2200   isosorbide mononitrate  30 mg Oral Daily   ketorolac  1 drop Right Eye Daily   magnesium oxide  400 mg Oral BID   mouth rinse  15 mL Mouth Rinse BID   metoprolol succinate  25 mg Oral Daily   multivitamin with minerals  1 tablet Oral Daily   omega-3 acid ethyl esters  1 g Oral Daily   pantoprazole  40 mg Oral QHS   polyethylene glycol  17 g Oral Daily   rosuvastatin  20 mg Oral Q2200   senna  1 tablet Oral BID   tamsulosin  0.4 mg Oral Daily   traZODone  50 mg Oral QHS   vitamin B-12  1,000 mcg Oral Daily   Continuous Infusions:  ceFEPime (MAXIPIME) IV 2 g (09/29/20 0111)     LOS: 11 days    Time spent: over 30 min    Fayrene Helper, MD [facility]   To contact the attending provider between 7A-7P or the covering provider during after hours 7P-7A, please log into the web site [email or web address] and access using universal [place] password for that web [email or web address] If you do not have the password, please call the hospital operator.  09/29/2020, 11:01 AM

## 2020-09-30 ENCOUNTER — Inpatient Hospital Stay (HOSPITAL_COMMUNITY): Payer: Medicare Other

## 2020-09-30 DIAGNOSIS — R6510 Systemic inflammatory response syndrome (SIRS) of non-infectious origin without acute organ dysfunction: Secondary | ICD-10-CM | POA: Diagnosis not present

## 2020-09-30 LAB — COMPREHENSIVE METABOLIC PANEL
ALT: 38 U/L (ref 0–44)
AST: 35 U/L (ref 15–41)
Albumin: 2.8 g/dL — ABNORMAL LOW (ref 3.5–5.0)
Alkaline Phosphatase: 75 U/L (ref 38–126)
Anion gap: 14 (ref 5–15)
BUN: 30 mg/dL — ABNORMAL HIGH (ref 8–23)
CO2: 24 mmol/L (ref 22–32)
Calcium: 9.3 mg/dL (ref 8.9–10.3)
Chloride: 94 mmol/L — ABNORMAL LOW (ref 98–111)
Creatinine, Ser: 1.89 mg/dL — ABNORMAL HIGH (ref 0.61–1.24)
GFR, Estimated: 35 mL/min — ABNORMAL LOW (ref >60)
Glucose, Bld: 179 mg/dL — ABNORMAL HIGH (ref 70–99)
Potassium: 4.4 mmol/L (ref 3.5–5.1)
Sodium: 132 mmol/L — ABNORMAL LOW (ref 135–145)
Total Bilirubin: 0.9 mg/dL (ref 0.3–1.2)
Total Protein: 7.8 g/dL (ref 6.5–8.1)

## 2020-09-30 LAB — CBC WITH DIFFERENTIAL/PLATELET
Abs Immature Granulocytes: 0.09 10*3/uL — ABNORMAL HIGH (ref 0.00–0.07)
Basophils Absolute: 0 10*3/uL (ref 0.0–0.1)
Basophils Relative: 0 %
Eosinophils Absolute: 0.2 10*3/uL (ref 0.0–0.5)
Eosinophils Relative: 2 %
HCT: 29.5 % — ABNORMAL LOW (ref 39.0–52.0)
Hemoglobin: 9.3 g/dL — ABNORMAL LOW (ref 13.0–17.0)
Immature Granulocytes: 1 %
Lymphocytes Relative: 9 %
Lymphs Abs: 0.9 10*3/uL (ref 0.7–4.0)
MCH: 29.6 pg (ref 26.0–34.0)
MCHC: 31.5 g/dL (ref 30.0–36.0)
MCV: 93.9 fL (ref 80.0–100.0)
Monocytes Absolute: 1.1 10*3/uL — ABNORMAL HIGH (ref 0.1–1.0)
Monocytes Relative: 11 %
Neutro Abs: 8 10*3/uL — ABNORMAL HIGH (ref 1.7–7.7)
Neutrophils Relative %: 77 %
Platelets: 226 10*3/uL (ref 150–400)
RBC: 3.14 MIL/uL — ABNORMAL LOW (ref 4.22–5.81)
RDW: 14.2 % (ref 11.5–15.5)
WBC: 10.4 10*3/uL (ref 4.0–10.5)
nRBC: 0 % (ref 0.0–0.2)

## 2020-09-30 LAB — GLUCOSE, CAPILLARY
Glucose-Capillary: 176 mg/dL — ABNORMAL HIGH (ref 70–99)
Glucose-Capillary: 191 mg/dL — ABNORMAL HIGH (ref 70–99)
Glucose-Capillary: 260 mg/dL — ABNORMAL HIGH (ref 70–99)
Glucose-Capillary: 154 mg/dL — ABNORMAL HIGH (ref 70–99)

## 2020-09-30 LAB — CULTURE, BLOOD (ROUTINE X 2)
Culture: NO GROWTH
Special Requests: ADEQUATE

## 2020-09-30 LAB — MAGNESIUM: Magnesium: 2.4 mg/dL (ref 1.7–2.4)

## 2020-09-30 LAB — PHOSPHORUS: Phosphorus: 4.4 mg/dL (ref 2.5–4.6)

## 2020-09-30 MED ORDER — VANCOMYCIN HCL 1250 MG/250ML IV SOLN
1250.0000 mg | INTRAVENOUS | Status: DC
  Administered 2020-10-01: 1250 mg via INTRAVENOUS
  Filled 2020-09-30: qty 250

## 2020-09-30 MED ORDER — WHITE PETROLATUM EX OINT
TOPICAL_OINTMENT | CUTANEOUS | Status: AC
  Filled 2020-09-30: qty 28.35

## 2020-09-30 NOTE — Progress Notes (Signed)
Consult NOTE    Ricky Nelson  [id number] DOB: Mar 04, 1935 DOA: 09/18/2020 PCP: Pcp, No   No chief complaint on file.  Brief Narrative:  Ricky Nelson is Ricky Nelson 85 y.o. male with medical history significant for CAD, T2DM, CKD stage IIIb, HTN, OSA on CPAP who was admitted under the neurosurgery service after undergoing L2-L4 decompression/fusion on 09/18/2020.  Patient had new fever developing 9/6  with transient episodes of tachycardia and hypotension.  No clear infectious source.  Surgical site appears clean.  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  Blood cultures and urine cultures were ordered but still pending collection.  Assessment & Plan:   Active Problems:   Lumbar spinal stenosis   Sleep apnea   Diabetes mellitus without complication (HCC)   Coronary artery disease   CKD (chronic kidney disease)   SIRS (systemic inflammatory response syndrome) (HCC)   Hypoxia   Pleural effusion  Sepsis 2/2 Serratia Marcescens Bacteremia: Fever with T-max 103.7 F rectally on 9/6. Also with reported rigors. associated tachycardia, tachypnea, transient hypotension Now with 1/2 sets blood cultures showing serratia Repeat blood cultures from 9/8 pending  Source not clear -> urinary vs pulmonary  Urine cx no growth (after abx) - UA with rare bacteria, 11-20 WBC's (collected late) CXR notable for mild interstitial edema and R pleural effusion COVID and influenza negative.   Initially narrowed to cefepime, though with concerns for wound infection, after discussion with NSGY, broadening back to cefepime/vanc Will continue to monitor wound per NSGY ID c/s, appreciate recommendations - recommending 7 days abx (may need to discuss again with ID pending progression of concern for wound infection)  Acute Hypoxic Respiratory Failure  History of COPD  Mild Pulmonary Edema   Heart Failure Exacerbation - could be related to COPD (though not chronically on O2), small R effusion on CT scan could represent pna - suspect  this is volume overload from heart failure +/- pneumonia (doesn't appear significantly volume overloaded on exam, he's also autodiuresing - net negative with weight downtrending) - currently on 1 L  - CXR with cardiomegaly and mild pulm edema with small R effusion - mild wheeze noted today - transmitted upper airway sounds? Schedule nebs. - repeat CXR 9/10 -> similar, moderate CHF, increased R and developing L base airspace disease, likely atelectasis - repeat CXR today pending - follow echo -> EF 50-55%, inferolateral hypokinesis, grade II diastolic dysfunction - moderately elevated PASP, mildly reduced RVSF - strict I/O, daily weights (he's net negative, weights downtrending) - hold additional IVF for now, s/p 1 dose lasix on 9/10 - creatinine has since bumped - will follow today - wean as tolerated, w/u further as indicated - SLP eval   Acute encephalopathy: Persistent today Likely 2/2 above ammonia (wnl).  Follow B12, folate, TSH (mildly elevated).  VBG without hypercarbia. Minimize sedating medications, hold narcotics, Robaxin.  Hold gabapentin for now. delirium precautions - will allow family to stay at bedside overnight Follow EKG for QT eval - consider seroquel (plan for trazodone right now) addendum - qt prolonged, hold qt prolonging agens -> trial melatonin, ativan qhs prn Delirium has been prolonged, concerning -> discussed potential to have to discuss goals of care/involving palliative if he's not improving   Hyponatremia: With Ricky Nelson normal serum osm, will continue to monitor Urine sodium 83 Continue to monitor    HFpEF  Inferolateral Hypokinesis Diuresis as noted above Continue metoprolol Consider cardiology for wall motion abnormality - unclear chronicity, no prior echos  ?Presyncope C/o vision going black and tremors/jerking  when standing with therapy Needs additional f/u, monitoring Orthostatics when able based on his mental status  S/p L2-L4 decompression/fusion  09/18/2020: Per neurosurgery. Wound with mild erythema and scant drainage with small areas of dehiscence - discussed with Dr. Zada Finders, will rebroaden abx while we monitor wound - per nsgy   CKD stage IIIb: Creatinine 2.46 at presentation Baseline unclear, as low as 1.58 here Fluctuating and now 1.54 Renal US (without evidence hydro - ? Bladder diverticulum), UA (pending) Hold additional IVF - net negative here - CXR appears overloaded, follow with lasix   Type 2 diabetes: Hold home glipizide, Jardiance, Victoza and reduce Levemir  Adjust insulin as needed, continue SSI    CAD: Plavix has been held in the perioperative period.  Continue Toprol-XL and Imdur as BP allows.   Iron deficiency anemia: S/p 2 unit PRBC for acute blood loss anemia (9/2 and 9/3)   Hypertension: Continue Toprol-XL and Imdur as BP allows.   OSA: Continue CPAP nightly.  Bladder Diverticulum Needs follow up with dedicated CT or [place] as allowed  DVT prophylaxis: SCD Code Status: full  Family Communication: son, daughter in law at bedside Disposition:   Per Ricky Nelson       Consultants:  [employer] [facility]   Procedures: PROCEDURE: 1. L2, L3 laminectomy with facetectomy for decompression of exiting nerve roots, more than would be required for placement of interbody graft 2. Placement of anterior interbody device - Medtronic epandable 98m cage x4 3. Posterior segmental instrumentation using Medtronic Solera pedicle screws and previously place Stryker D90 screws, L2 - L5 4. Interbody arthrodesis, L2-3, L3-4 5. Use of locally harvested bone autograft 6. Use of non-structural bone allograft - ProteOs 7. Removal of previous rod, L4-5  Antimicrobials: Anti-infectives (From admission, onward)    Start     Dose/Rate Route Frequency Ordered Stop   10/01/20 1300  vancomycin (VANCOREADY) IVPB 1250 mg/250 mL        1,250 mg 166.7 mL/hr over 90 Minutes Intravenous Every 48 hours 09/30/20 1223     09/30/20 1300   vancomycin (VANCOREADY) IVPB 750 mg/150 mL  Status:  Discontinued        750 mg 150 mL/hr over 60 Minutes Intravenous Every 24 hours 09/29/20 1144 09/30/20 1221   09/29/20 1230  vancomycin (VANCOREADY) IVPB 2000 mg/400 mL        2,000 mg 200 mL/hr over 120 Minutes Intravenous  Once 09/29/20 1144 09/29/20 1456   09/28/20 0200  vancomycin (VANCOREADY) IVPB 1500 mg/300 mL  Status:  Discontinued        1,500 mg 150 mL/hr over 120 Minutes Intravenous Every 48 hours 09/26/20 0035 09/27/20 1106   09/26/20 1400  ceFEPIme (MAXIPIME) 2 g in sodium chloride 0.9 % 100 mL IVPB        2 g 200 mL/hr over 30 Minutes Intravenous Every 12 hours 09/26/20 0035 10/02/20 2359   09/26/20 0130  vancomycin (VANCOREADY) IVPB 2000 mg/400 mL        2,000 mg 200 mL/hr over 120 Minutes Intravenous STAT 09/26/20 0035 09/26/20 0854   09/26/20 0100  ceFEPIme (MAXIPIME) 2 g in sodium chloride 0.9 % 100 mL IVPB        2 g 200 mL/hr over 30 Minutes Intravenous STAT 09/26/20 0035 09/26/20 0852   09/18/20 2300  ceFAZolin (ANCEF) IVPB 2g/100 mL premix        2 g 200 mL/hr over 30 Minutes Intravenous Every 8 hours 09/18/20 1852 09/19/20 0633   09/18/20 0930  ceFAZolin (  ANCEF) IVPB 2g/100 mL premix        2 g 200 mL/hr over 30 Minutes Intravenous On call to O.R. 09/18/20 0925 09/18/20 1520          Subjective: Confused   Objective: Vitals:   09/30/20 0257 09/30/20 0744 09/30/20 1056 09/30/20 1507  BP: (!) 150/79 (!) 156/93 121/87 (!) 148/89  Pulse: 98 (!) 104 (!) 111 100  Resp: 20 19 (!) 24 17  Temp: 98.6 F (37 C) 98.2 F (36.8 C) 98 F (36.7 C) 97.9 F (36.6 C)  TempSrc: Axillary Axillary Oral Axillary  SpO2: 94% 92% 93% 93%  Weight:      Height:        Intake/Output Summary (Last 24 hours) at 09/30/2020 1556 Last data filed at 09/30/2020 0318 Gross per 24 hour  Intake --  Output 650 ml  Net -650 ml   Filed Weights   09/18/20 0928 09/28/20 0452 09/29/20 0454  Weight: 98.2 kg 94.8 kg 93.3 kg     Examination:  General: No acute distress. Cardiovascular: RRR Lungs: no clear adventitious lung sounds Abdomen: Soft, nontender, nondistended  Neurological: Alert, but disoriented, lethargic. Moves all extremities 4. Cranial nerves II through XII grossly intact. Skin: lumbar wound not examined today Extremities: no LEE - trace edema to hips      Data Reviewed: I have personally reviewed following labs and imaging studies  CBC: Recent Labs  Lab 09/25/20 2146 09/26/20 0201 09/27/20 0352 09/28/20 0151 09/29/20 0228 09/30/20 0511  WBC 17.6* 13.5* 6.5 7.4 7.3 10.4  NEUTROABS 15.7*  --  5.0  --  5.5 8.0*  HGB 9.0* 8.7* 8.0* 8.4* 8.6* 9.3*  HCT 28.6* 28.0* 25.1* 26.2* 27.3* 29.5*  MCV 96.9 97.6 95.1 94.9 94.5 93.9  PLT 206 201 171 174 181 [id number]    Basic Metabolic Panel: Recent Labs  Lab 09/26/20 0201 09/26/20 1151 09/27/20 0352 09/28/20 0151 09/28/20 1209 09/29/20 0228 09/30/20 0511  NA 130*  --  129* 129*  --  132* 132*  K 5.5* 4.8 4.4 4.8  --  4.6 4.4  CL 94*  --  93* 94*  --  94* 94*  CO2 26  --  26 26  --  27 24  GLUCOSE 176*  --  154* 157*  --  138* 179*  BUN 41*  --  37* 31*  --  26* 30*  CREATININE 2.08*  --  1.84* 1.73*  --  1.54* 1.89*  CALCIUM 8.6*  --  9.1 8.7*  --  9.1 9.3  MG  --   --  2.2  --  2.3 2.1 2.4  PHOS  --   --  4.2  --  4.0 4.0 4.4    GFR: Estimated Creatinine Clearance: 33.4 mL/min (Kaitlin Ardito) (by C-G formula based on SCr of 1.89 mg/dL (H)).  Liver Function Tests: Recent Labs  Lab 09/27/20 0352 09/29/20 0228 09/30/20 0511  AST 80* 48* 35  ALT 41 42 38  ALKPHOS 68 72 75  BILITOT 0.4 0.8 0.9  PROT 6.4* 6.8 7.8  ALBUMIN 2.2* 2.4* 2.8*    CBG: Recent Labs  Lab 09/29/20 1113 09/29/20 1706 09/29/20 2151 09/30/20 0742 09/30/20 1245  GLUCAP 148* 225* 126* 176* 191*     Recent Results (from the past 240 hour(s))  Resp Panel by RT-PCR (Flu Modesta Sammons&B, Covid) Nasopharyngeal Swab     Status: None   Collection Time: 09/25/20 12:44 PM    Specimen: Nasopharyngeal Swab; Nasopharyngeal(NP) swabs in vial transport  medium  Result Value Ref Range Status   SARS Coronavirus 2 by RT PCR NEGATIVE NEGATIVE Final    Comment: (NOTE) SARS-CoV-2 target nucleic acids are NOT DETECTED.  The SARS-CoV-2 RNA is generally detectable in upper respiratory specimens during the acute phase of infection. The lowest concentration of SARS-CoV-2 viral copies this assay can detect is 138 copies/mL. Genavie Boettger negative result does not preclude SARS-Cov-2 infection and should not be used as the sole basis for treatment or other patient management decisions. Aurelie Dicenzo negative result may occur with  improper specimen collection/handling, submission of specimen other than nasopharyngeal swab, presence of viral mutation(s) within the areas targeted by this assay, and inadequate number of viral copies(<138 copies/mL). Thad Osoria negative result must be combined with clinical observations, patient history, and epidemiological information. The expected result is Negative.  Fact Sheet for Patients:  EntrepreneurPulse.com.au  Fact Sheet for Healthcare Providers:  IncredibleEmployment.be  This test is no t yet approved or cleared by the [place] FDA and  has been authorized for detection and/or diagnosis of SARS-CoV-2 by FDA under an Emergency Use Authorization (EUA). This EUA will remain  in effect (meaning this test can be used) for the duration of the COVID-19 declaration under Section 564(b)(1) of the Act, 21 U.S.C.section 360bbb-3(b)(1), unless the authorization is terminated  or revoked sooner.       Influenza Ilani Otterson by PCR NEGATIVE NEGATIVE Final   Influenza B by PCR NEGATIVE NEGATIVE Final    Comment: (NOTE) The Xpert Xpress SARS-CoV-2/FLU/RSV plus assay is intended as an aid in the diagnosis of influenza from Nasopharyngeal swab specimens and should not be used as Yahya Boldman sole basis for treatment. Nasal washings and aspirates are  unacceptable for Xpert Xpress SARS-CoV-2/FLU/RSV testing.  Fact Sheet for Patients: EntrepreneurPulse.com.au  Fact Sheet for Healthcare Providers: IncredibleEmployment.be  This test is not yet approved or cleared by the [place] FDA and has been authorized for detection and/or diagnosis of SARS-CoV-2 by FDA under an Emergency Use Authorization (EUA). This EUA will remain in effect (meaning this test can be used) for the duration of the COVID-19 declaration under Section 564(b)(1) of the Act, 21 U.S.C. section 360bbb-3(b)(1), unless the authorization is terminated or revoked.  Performed at [facility], [place] [address], [place], [place] [address]   Culture, blood (routine x 2)     Status: Abnormal   Collection Time: 09/25/20  9:46 PM   Specimen: BLOOD LEFT HAND  Result Value Ref Range Status   Specimen Description BLOOD LEFT HAND  Final   Special Requests AEROBIC BOTTLE ONLY Blood Culture adequate volume  Final   Culture  Setup Time   Final    GRAM NEGATIVE RODS AEROBIC BOTTLE ONLY CRITICAL RESULT CALLED TO, READ BACK BY AND VERIFIED WITH: PHARMD FRANK WILSON 09/27/20'@00'$ :40 BY TW Performed at [facility], [place] [address] [place], [place] [address]    Culture SERRATIA MARCESCENS (Mariana Wiederholt)  Final   Report Status 09/28/2020 FINAL  Final   Organism ID, Bacteria SERRATIA MARCESCENS  Final      Susceptibility   Serratia marcescens - MIC*    CEFAZOLIN >=64 RESISTANT Resistant     CEFEPIME <=0.12 SENSITIVE Sensitive     CEFTAZIDIME <=1 SENSITIVE Sensitive     CEFTRIAXONE <=0.25 SENSITIVE Sensitive     CIPROFLOXACIN <=0.25 SENSITIVE Sensitive     GENTAMICIN <=1 SENSITIVE Sensitive     TRIMETH/SULFA <=20 SENSITIVE Sensitive     * SERRATIA MARCESCENS  Culture, blood (routine x 2)  Status: None (Preliminary result)   Collection Time: 09/25/20  9:46 PM   Specimen: BLOOD RIGHT HAND  Result Value Ref Range Status   Specimen Description  BLOOD RIGHT HAND  Final   Special Requests AEROBIC BOTTLE ONLY Blood Culture adequate volume  Final   Culture   Final    NO GROWTH 4 DAYS Performed at [facility], [address] [place], [place] [address]    Report Status PENDING  Incomplete  Blood Culture ID Panel (Reflexed)     Status: Abnormal   Collection Time: 09/25/20  9:46 PM  Result Value Ref Range Status   Enterococcus faecalis NOT DETECTED NOT DETECTED Final   Enterococcus Faecium NOT DETECTED NOT DETECTED Final   Listeria monocytogenes NOT DETECTED NOT DETECTED Final   Staphylococcus species NOT DETECTED NOT DETECTED Final   Staphylococcus aureus (BCID) NOT DETECTED NOT DETECTED Final   Staphylococcus epidermidis NOT DETECTED NOT DETECTED Final   Staphylococcus lugdunensis NOT DETECTED NOT DETECTED Final   Streptococcus species NOT DETECTED NOT DETECTED Final   Streptococcus agalactiae NOT DETECTED NOT DETECTED Final   Streptococcus pneumoniae NOT DETECTED NOT DETECTED Final   Streptococcus pyogenes NOT DETECTED NOT DETECTED Final   Vidalia Serpas.calcoaceticus-baumannii NOT DETECTED NOT DETECTED Final   Bacteroides fragilis NOT DETECTED NOT DETECTED Final   Enterobacterales DETECTED (Ranbir Chew) NOT DETECTED Final    Comment: Enterobacterales represent Jalisa Sacco large order of gram negative bacteria, not Mordechai Matuszak single organism. CRITICAL RESULT CALLED TO, READ BACK BY AND VERIFIED WITH: PHARMD FRANK WILSON 09/27/20'@00'$ :40 BY TW    Enterobacter cloacae complex NOT DETECTED NOT DETECTED Final   Escherichia coli NOT DETECTED NOT DETECTED Final   Klebsiella aerogenes NOT DETECTED NOT DETECTED Final   Klebsiella oxytoca NOT DETECTED NOT DETECTED Final   Klebsiella pneumoniae NOT DETECTED NOT DETECTED Final   Proteus species NOT DETECTED NOT DETECTED Final   Salmonella species NOT DETECTED NOT DETECTED Final   Serratia marcescens DETECTED (Minola Guin) NOT DETECTED Final    Comment: CRITICAL RESULT CALLED TO, READ BACK BY AND VERIFIED WITH: PHARMD FRANK WILSON  09/27/20'@00'$ :40 BY TW    Haemophilus influenzae NOT DETECTED NOT DETECTED Final   Neisseria meningitidis NOT DETECTED NOT DETECTED Final   Pseudomonas aeruginosa NOT DETECTED NOT DETECTED Final   Stenotrophomonas maltophilia NOT DETECTED NOT DETECTED Final   Candida albicans NOT DETECTED NOT DETECTED Final   Candida auris NOT DETECTED NOT DETECTED Final   Candida glabrata NOT DETECTED NOT DETECTED Final   Candida krusei NOT DETECTED NOT DETECTED Final   Candida parapsilosis NOT DETECTED NOT DETECTED Final   Candida tropicalis NOT DETECTED NOT DETECTED Final   Cryptococcus neoformans/gattii NOT DETECTED NOT DETECTED Final   CTX-M ESBL NOT DETECTED NOT DETECTED Final   Carbapenem resistance IMP NOT DETECTED NOT DETECTED Final   Carbapenem resistance KPC NOT DETECTED NOT DETECTED Final   Carbapenem resistance NDM NOT DETECTED NOT DETECTED Final   Carbapenem resist OXA 48 LIKE NOT DETECTED NOT DETECTED Final   Carbapenem resistance VIM NOT DETECTED NOT DETECTED Final    Comment: Performed at [facility], [address] [place], [place] [address]  Urine Culture     Status: None   Collection Time: 09/26/20 11:06 AM   Specimen: Urine, Catheterized  Result Value Ref Range Status   Specimen Description URINE, CATHETERIZED  Final   Special Requests NONE  Final   Culture   Final    NO GROWTH Performed at [facility], [address], [place], [place] [address]    Report Status 09/27/2020 FINAL  Final  Culture, blood (routine x 2)     Status: None (Preliminary result)   Collection Time: 09/27/20  8:17 AM   Specimen: BLOOD  Result Value Ref Range Status   Specimen Description BLOOD LEFT ANTECUBITAL  Final   Special Requests   Final    BOTTLES DRAWN AEROBIC AND ANAEROBIC Blood Culture adequate volume   Culture   Final    NO GROWTH 2 DAYS Performed at [facility], [place] [address] [place], [place] [address]    Report Status PENDING  Incomplete  Culture, blood (routine x  2)     Status: None (Preliminary result)   Collection Time: 09/27/20  8:19 AM   Specimen: BLOOD  Result Value Ref Range Status   Specimen Description BLOOD BLOOD RIGHT HAND  Final   Special Requests   Final    BOTTLES DRAWN AEROBIC AND ANAEROBIC Blood Culture adequate volume   Culture   Final    NO GROWTH 2 DAYS Performed at [facility], [place] [address], [place],  [address]    Report Status PENDING  Incomplete         Radiology Studies: DG CHEST PORT 1 VIEW  Result Date: 09/29/2020 CLINICAL DATA:  Hypoxia EXAM: PORTABLE CHEST 1 VIEW COMPARISON:  09/27/2020 FINDINGS: Prior median sternotomy. Midline trachea. Mild cardiomegaly. Atherosclerosis in the transverse aorta. No pleural effusion or pneumothorax. Interstitial prominence and indistinctness persists. Increase right and developing left base airspace disease. IMPRESSION: Moderate congestive heart failure, similar. Increased right and developing left base Airspace disease, likely atelectasis. Aortic Atherosclerosis (ICD10-I70.0). Electronically Signed   By: Abigail Miyamoto M.D.   On: 09/29/2020 10:00   ECHOCARDIOGRAM COMPLETE  Result Date: 09/28/2020    ECHOCARDIOGRAM REPORT   Patient Name:   MARDEN TUNG Date of Exam: 09/28/2020 Medical Rec #:  [id number]       Height:       69.0 in Accession #:    [id number]      Weight:       209.0 lb Date of Birth:  1935-07-18       BSA:          2.105 m Patient Age:    48 years        BP:           133/70 mmHg Patient Gender: M               HR:           79 bpm. Exam Location:  Inpatient Procedure: 2D Echo and Intracardiac Opacification Agent Indications:    edema  History:        Patient has no prior history of Echocardiogram examinations.                 CAD, chronic kidney disease; Risk Factors:Diabetes and Sleep                 Apnea.  Sonographer:    Johny Chess RDCS Referring Phys: [phone] Jalyah Weinheimer CALDWELL POWELL JR  Sonographer Comments: Image acquisition challenging due to  uncooperative patient and Image acquisition challenging due to respiratory motion. IMPRESSIONS  1. Technically difficult study. Left ventricular ejection fraction, by estimation, is 50 to 55%. The left ventricle has low normal function. The left ventricle demonstrates regional wall motion abnormalities. Inferolateral hypoinesis. There is mild left  ventricular hypertrophy. Left ventricular diastolic parameters are consistent with Grade  II diastolic dysfunction (pseudonormalization). Elevated left atrial pressure.  2. Right ventricular systolic function is mildly reduced. The right ventricular size is normal. There is moderately elevated pulmonary artery systolic pressure.  3. Left atrial size was severely dilated.  4. The mitral valve is normal in structure. Mild mitral valve regurgitation.  5. The tricuspid valve is abnormal. Tricuspid valve regurgitation is moderate.  6. The aortic valve was not well visualized. Aortic valve regurgitation is not visualized. Mild to moderate aortic valve sclerosis/calcification is present, without any evidence of aortic stenosis.  7. The inferior vena cava is dilated in size with >50% respiratory variability, suggesting right atrial pressure of 8 mmHg. FINDINGS  Left Ventricle: Left ventricular ejection fraction, by estimation, is 50 to 55%. The left ventricle has low normal function. The left ventricle demonstrates regional wall motion abnormalities. Definity contrast agent was given IV to delineate the left ventricular endocardial borders. The left ventricular internal cavity size was normal in size. There is mild left ventricular hypertrophy. Left ventricular diastolic parameters are consistent with Grade II diastolic dysfunction (pseudonormalization). Elevated left atrial pressure. Right Ventricle: The right ventricular size is normal. No increase in right ventricular wall thickness. Right ventricular systolic function is mildly reduced. There is moderately elevated pulmonary  artery systolic pressure. The tricuspid regurgitant velocity is 3.09 m/s, and with an assumed right atrial pressure of 8 mmHg, the estimated right ventricular systolic pressure is [id number] mmHg. Left Atrium: Left atrial size was severely dilated. Right Atrium: Right atrial size was normal in size. Pericardium: There is no evidence of pericardial effusion. Mitral Valve: The mitral valve is normal in structure. Mild mitral valve regurgitation. Tricuspid Valve: The tricuspid valve is abnormal. Tricuspid valve regurgitation is moderate. Aortic Valve: The aortic valve was not well visualized. Aortic valve regurgitation is not visualized. Mild to moderate aortic valve sclerosis/calcification is present, without any evidence of aortic stenosis. Pulmonic Valve: The pulmonic valve was normal in structure. Pulmonic valve regurgitation is not visualized. Aorta: The aortic root and ascending aorta are structurally normal, with no evidence of dilitation. Venous: The inferior vena cava is dilated in size with greater than 50% respiratory variability, suggesting right atrial pressure of 8 mmHg. IAS/Shunts: The interatrial septum was not well visualized.  LEFT VENTRICLE PLAX 2D LVIDd:         4.80 cm  Diastology LVIDs:         3.70 cm  LV e' medial:    6.74 cm/s LV PW:         1.30 cm  LV E/e' medial:  12.7 LV IVS:        1.30 cm  LV e' lateral:   8.81 cm/s LVOT diam:     2.00 cm  LV E/e' lateral: 9.7 LV SV:         42 LV SV Index:   20 LVOT Area:     3.14 cm  RIGHT VENTRICLE            IVC RV S prime:     9.46 cm/s  IVC diam: 2.30 cm TAPSE (M-mode): 1.4 cm LEFT ATRIUM              Index       RIGHT ATRIUM           Index LA diam:        4.50 cm  2.14 cm/m  RA Area:     20.00 cm LA Vol (A2C):   83.3 ml  39.57 ml/m RA Volume:  57.50 ml  27.32 ml/m LA Vol (A4C):   121.0 ml 57.48 ml/m LA Biplane Vol: 104.0 ml 49.41 ml/m  AORTIC VALVE LVOT Vmax:   63.75 cm/s LVOT Vmean:  41.500 cm/s LVOT VTI:    0.132 m  AORTA Ao Root diam: 3.10 cm  Ao Asc diam:  3.60 cm MITRAL VALVE               TRICUSPID VALVE MV Area (PHT): 3.99 cm    TR Peak grad:   38.2 mmHg MV Decel Time: 190 msec    TR Vmax:        309.00 cm/s MV E velocity: 85.30 cm/s MV Retina Bernardy velocity: 57.40 cm/s  SHUNTS MV E/Jeneen Doutt ratio:  1.49        Systemic VTI:  0.13 m                            Systemic Diam: 2.00 cm Oswaldo Milian MD Electronically signed by Oswaldo Milian MD Signature Date/Time: 09/28/2020/8:38:09 PM    Final         Scheduled Meds:  buPROPion  150 mg Oral q morning   Chlorhexidine Gluconate Cloth  6 each Topical Daily   docusate sodium  100 mg Oral BID   DULoxetine  60 mg Oral Daily   feeding supplement (GLUCERNA SHAKE)  237 mL Oral TID BM   influenza vaccine adjuvanted  0.5 mL Intramuscular Tomorrow-1000   insulin aspart  0-20 Units Subcutaneous TID WC   insulin detemir  20 Units Subcutaneous Q2200   isosorbide mononitrate  30 mg Oral Daily   ketorolac  1 drop Right Eye Daily   magnesium oxide  400 mg Oral BID   mouth rinse  15 mL Mouth Rinse BID   melatonin  3 mg Oral QHS   metoprolol succinate  25 mg Oral Daily   multivitamin with minerals  1 tablet Oral Daily   omega-3 acid ethyl esters  1 g Oral Daily   pantoprazole  40 mg Oral QHS   polyethylene glycol  17 g Oral Daily   rosuvastatin  20 mg Oral Q2200   senna  1 tablet Oral BID   tamsulosin  0.4 mg Oral Daily   vitamin B-12  1,000 mcg Oral Daily   white petrolatum       Continuous Infusions:  ceFEPime (MAXIPIME) IV 2 g (09/30/20 0109)   [START ON 10/01/2020] vancomycin       LOS: 12 days    Time spent: over 30 min    Fayrene Helper, MD [facility]   To contact the attending provider between 7A-7P or the covering provider during after hours 7P-7A, please log into the web site [email or web address] and access using universal [place] password for that web site. If you do not have the password, please call the hospital operator.  09/30/2020, 3:56 PM

## 2020-09-30 NOTE — Progress Notes (Signed)
Neurosurgery Service Progress Note  Subjective: No acute events overnight, cont'd confusion, no new complaints, said his back feels fine and asked me how my back is feeling  Objective: Vitals:   09/29/20 2002 09/29/20 2314 09/30/20 0257 09/30/20 0744  BP: 105/85 109/76 (!) 150/79 (!) 156/93  Pulse: 93 100 98 (!) 104  Resp: '20 20 20 19  '$ Temp: 98.2 F (36.8 C) 98.7 F (37.1 C) 98.6 F (37 C) 98.2 F (36.8 C)  TempSrc: Oral Oral Axillary Axillary  SpO2: 96% 93% 94% 92%  Weight:      Height:        Physical Exam: Awake, Ox2, PERRL, EOMI, FS, TM, Strength 5/5 x4, SILTx4, Fcx4, speech fluent but mildly dysarthric Wound stable w/ some mild erythema and scant areas of drainage with some small areas of dehiscence  Assessment & Plan: 85 y.o. man s/p lumbar fusion w/ delayed post-op bacteremia / sepsis / confusion.  -cont honeycomb to catch any drainage, discussed w/ the pt's wife that we'll have to watch the wound closely to see which direction it goes  -medicine recs / ID recs  Judith Part  09/30/20 9:56 AM

## 2020-09-30 NOTE — Progress Notes (Addendum)
Pharmacy Antibiotic Note  Ricky Nelson is a 85 y.o. male admitted on 09/18/2020 s/p lumbar fusion. Pharmacy has been consulted for Cefepime dosing for serratia bacteremia and now adding back vancomycin for wound infection per Neurosurgery (day #2). Patient was given loading dose only of vancomycin on 9/7 prior to d/c.  SCr trended back up today to 1.89.  Plan: Cefepime 2g IV q12h Adjust vancomycin to '1250mg'$  IV q48h Goal AUC 400-550. Expected AUC: 462. SCr used: 1.89 Monitor clinical progress, c/s Watch renal function trend closely and adjust dose as appropriate, check vancomycin levels as indicated F/u de-escalation plan/LOT  Height: '5\' 9"'$  (175.3 cm) Weight: 93.3 kg (205 lb 11 oz) IBW/kg (Calculated) : 70.7  Temp (24hrs), Avg:98.4 F (36.9 C), Min:98.1 F (36.7 C), Max:98.7 F (37.1 C)  Recent Labs  Lab 09/26/20 0201 09/26/20 0213 09/26/20 0443 09/27/20 0352 09/28/20 0151 09/29/20 0228 09/30/20 0511  WBC 13.5*  --   --  6.5 7.4 7.3 10.4  CREATININE 2.08*  --   --  1.84* 1.73* 1.54* 1.89*  LATICACIDVEN  --  1.5 1.0  --   --   --   --      Estimated Creatinine Clearance: 33.4 mL/min (A) (by C-G formula based on SCr of 1.89 mg/dL (H)).    Allergies  Allergen Reactions   Allergy Relief [Chlorpheniramine Maleate] Shortness Of Breath   Diazepam     Other reaction(s): Hypotension, Low blood pressure, Other (See Comments)   Diphenhydramine Hcl Shortness Of Breath    Other reaction(s): Other (See Comments), Stopped breathing Other reaction(s): Shortness Of Breath Shortness of breath with benadryl    Diphenhydramine-Zinc Acetate Shortness Of Breath   Atorvastatin Other (See Comments)    Other reaction(s): Muscle pain, Other (See Comments), Unknown Elevated CK      Fluvastatin Other (See Comments)    Other reaction(s): Other (See Comments) myalgias   Lisinopril     Other reaction(s): Hyperkalemia, Other (See Comments)   Metformin And Related     Other reaction(s):  Other Renal function   Piperacillin-Tazobactam In Dex Rash    Other reaction(s): Rash Other reaction(s): Rash    Diphenhydramine     Other reaction(s): Low blood pressure   Other Other (See Comments)    [place]    Arturo Morton, PharmD, BCPS Please check AMION for all [place] contact numbers Clinical Pharmacist 09/30/2020 12:23 PM

## 2020-09-30 NOTE — Progress Notes (Signed)
Son and daughter-in-law concerned about pt plan going home, esp with elderly wife. Son wants to talk to social work with plan when availalble.   [name] Mohamad (Son) [phone]

## 2020-10-01 DIAGNOSIS — R410 Disorientation, unspecified: Secondary | ICD-10-CM | POA: Diagnosis not present

## 2020-10-01 LAB — COMPREHENSIVE METABOLIC PANEL
ALT: 31 U/L (ref 0–44)
AST: 29 U/L (ref 15–41)
Albumin: 2.6 g/dL — ABNORMAL LOW (ref 3.5–5.0)
Alkaline Phosphatase: 79 U/L (ref 38–126)
Anion gap: 13 (ref 5–15)
BUN: 32 mg/dL — ABNORMAL HIGH (ref 8–23)
CO2: 24 mmol/L (ref 22–32)
Calcium: 9.3 mg/dL (ref 8.9–10.3)
Chloride: 96 mmol/L — ABNORMAL LOW (ref 98–111)
Creatinine, Ser: 1.78 mg/dL — ABNORMAL HIGH (ref 0.61–1.24)
GFR, Estimated: 37 mL/min — ABNORMAL LOW (ref >60)
Glucose, Bld: 168 mg/dL — ABNORMAL HIGH (ref 70–99)
Potassium: 4.4 mmol/L (ref 3.5–5.1)
Sodium: 133 mmol/L — ABNORMAL LOW (ref 135–145)
Total Bilirubin: 0.7 mg/dL (ref 0.3–1.2)
Total Protein: 7.5 g/dL (ref 6.5–8.1)

## 2020-10-01 LAB — CBC WITH DIFFERENTIAL/PLATELET
Abs Immature Granulocytes: 0.07 10*3/uL (ref 0.00–0.07)
Basophils Absolute: 0 10*3/uL (ref 0.0–0.1)
Basophils Relative: 0 %
Eosinophils Absolute: 0.2 10*3/uL (ref 0.0–0.5)
Eosinophils Relative: 2 %
HCT: 33.4 % — ABNORMAL LOW (ref 39.0–52.0)
Hemoglobin: 10.5 g/dL — ABNORMAL LOW (ref 13.0–17.0)
Immature Granulocytes: 1 %
Lymphocytes Relative: 8 %
Lymphs Abs: 0.8 10*3/uL (ref 0.7–4.0)
MCH: 30 pg (ref 26.0–34.0)
MCHC: 31.4 g/dL (ref 30.0–36.0)
MCV: 95.4 fL (ref 80.0–100.0)
Monocytes Absolute: 1 10*3/uL (ref 0.1–1.0)
Monocytes Relative: 10 %
Neutro Abs: 7.1 10*3/uL (ref 1.7–7.7)
Neutrophils Relative %: 79 %
Platelets: 228 10*3/uL (ref 150–400)
RBC: 3.5 MIL/uL — ABNORMAL LOW (ref 4.22–5.81)
RDW: 14.3 % (ref 11.5–15.5)
WBC: 9.1 10*3/uL (ref 4.0–10.5)
nRBC: 0 % (ref 0.0–0.2)

## 2020-10-01 LAB — PHOSPHORUS: Phosphorus: 3.8 mg/dL (ref 2.5–4.6)

## 2020-10-01 LAB — GLUCOSE, CAPILLARY
Glucose-Capillary: 181 mg/dL — ABNORMAL HIGH (ref 70–99)
Glucose-Capillary: 196 mg/dL — ABNORMAL HIGH (ref 70–99)
Glucose-Capillary: 188 mg/dL — ABNORMAL HIGH (ref 70–99)
Glucose-Capillary: 296 mg/dL — ABNORMAL HIGH (ref 70–99)
Glucose-Capillary: 148 mg/dL — ABNORMAL HIGH (ref 70–99)

## 2020-10-01 LAB — MAGNESIUM: Magnesium: 2.4 mg/dL (ref 1.7–2.4)

## 2020-10-01 LAB — BRAIN NATRIURETIC PEPTIDE: B Natriuretic Peptide: 552.2 pg/mL — ABNORMAL HIGH (ref 0.0–100.0)

## 2020-10-01 MED ORDER — VALACYCLOVIR HCL 500 MG PO TABS: 500.0000 mg | ORAL_TABLET | Freq: Two times a day (BID) | ORAL | Status: DC

## 2020-10-01 MED ORDER — CLOPIDOGREL BISULFATE 75 MG PO TABS
75.0000 mg | ORAL_TABLET | Freq: Every day | ORAL | Status: DC
  Administered 2020-10-03 – 2020-10-11 (×9): 75 mg via ORAL
  Filled 2020-10-01 (×10): qty 1

## 2020-10-01 MED ORDER — NYSTATIN 100000 UNIT/ML MT SUSP
5.0000 mL | Freq: Four times a day (QID) | OROMUCOSAL | Status: DC
  Administered 2020-10-01 – 2020-10-02 (×5): 500000 [IU] via ORAL
  Filled 2020-10-01 (×6): qty 5

## 2020-10-01 MED ORDER — LIDOCAINE VISCOUS HCL 2 % MT SOLN
15.0000 mL | Freq: Four times a day (QID) | OROMUCOSAL | Status: DC | PRN
  Administered 2020-10-05: 15 mL via OROMUCOSAL
  Filled 2020-10-01 (×3): qty 15

## 2020-10-01 NOTE — Progress Notes (Signed)
Consult NOTE    Ricky Nelson  [id number] DOB: 12-Dec-1935 DOA: 09/18/2020 PCP: Pcp, No   No chief complaint on file.  Brief Narrative:  Ricky Nelson is Ricky Nelson 85 y.o. male with medical history significant for CAD, T2DM, CKD stage IIIb, HTN, OSA on CPAP who was admitted under the neurosurgery service after undergoing L2-L4 decompression/fusion on 09/18/2020.  Patient had new fever developing 9/6  with transient episodes of tachycardia and hypotension.  No clear infectious source.  Surgical site appears clean.  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  Blood cultures and urine cultures were ordered but still pending collection.  Assessment & Plan:   Active Problems:   Lumbar spinal stenosis   Sleep apnea   Diabetes mellitus without complication (HCC)   Coronary artery disease   CKD (chronic kidney disease)   SIRS (systemic inflammatory response syndrome) (HCC)   Hypoxia   Pleural effusion  Acute encephalopathy  Concern for neurocognitive deficit: Persistent today Presumed 2/2 serratia bacteremia, surgery, acute hospitalization, etc ammonia (wnl).  Follow B12, folate, TSH (mildly elevated -> repeat).  VBG without hypercarbia. Minimize sedating medications, hold narcotics, Robaxin.  Hold gabapentin for now. delirium precautions - will allow family to stay at bedside overnight - notably wife notes symptoms concerning for sundowning occasionally at home - ? Dementia, not diagnosed Follow EKG for QT eval - consider seroquel (plan for trazodone right now) addendum - qt prolonged, hold qt prolonging agens -> trial melatonin, ativan qhs prn Delirium has been prolonged, concerning -> discussed potential to have to discuss goals of care/involving palliative if he's not improving Try to minimize use of restraints - utilize sitter, telesitter, family when possible   Sepsis 2/2 Serratia Marcescens Bacteremia: Fever with T-max 103.7 F rectally on 9/6. Also with reported rigors. associated tachycardia, tachypnea,  transient hypotension Now with 1/2 sets blood cultures showing serratia Repeat blood cultures from 9/8 pending  Source not clear -> urinary vs pulmonary  Urine cx no growth (after abx) - UA with rare bacteria, 11-20 WBC's (collected late) CXR notable for mild interstitial edema and R pleural effusion COVID and influenza negative.   Continue cefepime - plan for 7 days ID c/s, appreciate recommendations - recommending 7 days abx (minimal concern for wound infection per discussion with NSGY, d/c vanc)  Herpes Simplex Labialis   Oral Ulcers Start valtrex Lidocaine, nystatin ? If oral ulcers related to aphthous ulcers vs ulcers related to dentures or HSV Will continue to monitor for now  Acute Hypoxic Respiratory Failure  History of COPD  Mild Pulmonary Edema   Heart Failure Exacerbation - could be related to COPD (though not chronically on O2), small R effusion on CT scan could represent pna - pneumonia +/- HF exacerbation - CT without visible PE, small R effusion - CXR 9/11 without acute CP disease - currently on 1 L  - follow echo -> EF 50-55%, inferolateral hypokinesis, grade II diastolic dysfunction - moderately elevated PASP, mildly reduced RVSF - strict I/O, daily weights (he's net negative, weights downtrending) - hold additional IVF for now - /p 1 dose lasix on 9/10 (he's autodiuresed with net negative 14 L) - wean as tolerated, w/u further as indicated - SLP eval   Hyponatremia: With Ricky Nelson normal serum osm, will continue to monitor Urine sodium 83 Continue to monitor    HFpEF  Inferolateral Hypokinesis Diuresis as noted above Continue metoprolol Consider cardiology for wall motion abnormality - unclear chronicity, no prior echos  ?Presyncope C/o vision going black and tremors/jerking when standing  with therapy Needs additional f/u, monitoring BP's look ok, will continue to monitor   S/p L2-L4 decompression/fusion 09/18/2020: Per neurosurgery. Wound doesn't appear  infected per Dr. Kathyrn Sheriff   CKD stage IIIb: Creatinine 2.46 at presentation Baseline unclear, as low as 1.58 here Fluctuating and now 1.78 Renal US (without evidence hydro - ? Bladder diverticulum), UA (pending) Hold additional IVF - net negative here  Type 2 diabetes: Hold home glipizide, Jardiance, Victoza and reduce Levemir  Adjust insulin as needed, continue SSI    CAD: Will resume.  Continue Toprol-XL and Imdur as BP allows.   Iron deficiency anemia: S/p 2 unit PRBC for acute blood loss anemia (9/2 and 9/3)   Hypertension: Continue Toprol-XL and Imdur as BP allows.   OSA: Continue CPAP nightly.  Bladder Diverticulum Needs follow up with dedicated CT or [place] as allowed  DVT prophylaxis: SCD Code Status: full  Family Communication: son at bedside Disposition:   Per [place]       Consultants:  [facility]   Procedures: PROCEDURE: 1. L2, L3 laminectomy with facetectomy for decompression of exiting nerve roots, more than would be required for placement of interbody graft 2. Placement of anterior interbody device - Medtronic epandable 21m cage x4 3. Posterior segmental instrumentation using Medtronic Solera pedicle screws and previously place Stryker D90 screws, L2 - L5 4. Interbody arthrodesis, L2-3, L3-4 5. Use of locally harvested bone autograft 6. Use of non-structural bone allograft - ProteOs 7. Removal of previous rod, L4-5  Antimicrobials: Anti-infectives (From admission, onward)    Start     Dose/Rate Route Frequency Ordered Stop   10/01/20 1000  vancomycin (VANCOREADY) IVPB 1250 mg/250 mL  Status:  Discontinued        1,250 mg 166.7 mL/hr over 90 Minutes Intravenous Every 48 hours 09/30/20 1223 10/01/20 1237   09/30/20 1300  vancomycin (VANCOREADY) IVPB 750 mg/150 mL  Status:  Discontinued        750 mg 150 mL/hr over 60 Minutes Intravenous Every 24 hours 09/29/20 1144 09/30/20 1221   09/29/20 1230  vancomycin (VANCOREADY) IVPB 2000 mg/400 mL         2,000 mg 200 mL/hr over 120 Minutes Intravenous  Once 09/29/20 1144 09/29/20 1456   09/28/20 0200  vancomycin (VANCOREADY) IVPB 1500 mg/300 mL  Status:  Discontinued        1,500 mg 150 mL/hr over 120 Minutes Intravenous Every 48 hours 09/26/20 0035 09/27/20 1106   09/26/20 1400  ceFEPIme (MAXIPIME) 2 g in sodium chloride 0.9 % 100 mL IVPB        2 g 200 mL/hr over 30 Minutes Intravenous Every 12 hours 09/26/20 0035 10/02/20 2359   09/26/20 0130  vancomycin (VANCOREADY) IVPB 2000 mg/400 mL        2,000 mg 200 mL/hr over 120 Minutes Intravenous STAT 09/26/20 0035 09/26/20 0854   09/26/20 0100  ceFEPIme (MAXIPIME) 2 g in sodium chloride 0.9 % 100 mL IVPB        2 g 200 mL/hr over 30 Minutes Intravenous STAT 09/26/20 0035 09/26/20 0852   09/18/20 2300  ceFAZolin (ANCEF) IVPB 2g/100 mL premix        2 g 200 mL/hr over 30 Minutes Intravenous Every 8 hours 09/18/20 1852 09/19/20 0633   09/18/20 0930  ceFAZolin (ANCEF) IVPB 2g/100 mL premix        2 g 200 mL/hr over 30 Minutes Intravenous On call to O.R. 09/18/20 0BW:[id number]/30/22 1520  Subjective: confused  Objective: Vitals:   10/01/20 0437 10/01/20 0700 10/01/20 0806 10/01/20 1100  BP:  (!) 141/79 (!) 149/97   Pulse:   100   Resp:      Temp:  98.4 F (36.9 C)  98.1 F (36.7 C)  TempSrc:  Oral    SpO2:  97%    Weight: 93.3 kg     Height:       No intake or output data in the 24 hours ending 10/01/20 1533  Filed Weights   09/28/20 0452 09/29/20 0454 10/01/20 0437  Weight: 94.8 kg 93.3 kg 93.3 kg    Examination:  General: No acute distress. Cardiovascular: RRR Lungs: unlabored Abdomen: Soft, nontender, nondistended  Neurological: confused, pleasant, moving all extremities - falls asleep quickly  Skin: wound not examined, discussed with nsgy Extremities: No clubbing or cyanosis. No edema.    Data Reviewed: I have personally reviewed following labs and imaging studies  CBC: Recent Labs  Lab  09/25/20 2146 09/26/20 0201 09/27/20 0352 09/28/20 0151 09/29/20 0228 09/30/20 0511 10/01/20 0355  WBC 17.6*   < > 6.5 7.4 7.3 10.4 9.1  NEUTROABS 15.7*  --  5.0  --  5.5 8.0* 7.1  HGB 9.0*   < > 8.0* 8.4* 8.6* 9.3* 10.5*  HCT 28.6*   < > 25.1* 26.2* 27.3* 29.5* 33.4*  MCV 96.9   < > 95.1 94.9 94.5 93.9 95.4  PLT 206   < > 171 174 181 226 228   < > = values in this interval not displayed.    Basic Metabolic Panel: Recent Labs  Lab 09/27/20 0352 09/28/20 0151 09/28/20 1209 09/29/20 0228 09/30/20 0511 10/01/20 0355  NA 129* 129*  --  132* 132* 133*  K 4.4 4.8  --  4.6 4.4 4.4  CL 93* 94*  --  94* 94* 96*  CO2 26 26  --  '27 24 24  '$ GLUCOSE 154* 157*  --  138* 179* 168*  BUN 37* 31*  --  26* 30* 32*  CREATININE 1.84* 1.73*  --  1.54* 1.89* 1.78*  CALCIUM 9.1 8.7*  --  9.1 9.3 9.3  MG 2.2  --  2.3 2.1 2.4 2.4  PHOS 4.2  --  4.0 4.0 4.4 3.8    GFR: Estimated Creatinine Clearance: 35.4 mL/min (Janeese Mcgloin) (by C-G formula based on SCr of 1.78 mg/dL (H)).  Liver Function Tests: Recent Labs  Lab 09/27/20 0352 09/29/20 0228 09/30/20 0511 10/01/20 0355  AST 80* 48* 35 29  ALT 41 42 38 31  ALKPHOS 68 72 75 79  BILITOT 0.4 0.8 0.9 0.7  PROT 6.4* 6.8 7.8 7.5  ALBUMIN 2.2* 2.4* 2.8* 2.6*    CBG: Recent Labs  Lab 09/30/20 1713 09/30/20 2120 10/01/20 0721 10/01/20 1100 10/01/20 1217  GLUCAP 260* 154* 181* 196* 188*     Recent Results (from the past 240 hour(s))  Resp Panel by RT-PCR (Flu Keylen Uzelac&B, Covid) Nasopharyngeal Swab     Status: None   Collection Time: 09/25/20 12:44 PM   Specimen: Nasopharyngeal Swab; Nasopharyngeal(NP) swabs in vial transport medium  Result Value Ref Range Status   SARS Coronavirus 2 by RT PCR NEGATIVE NEGATIVE Final    Comment: (NOTE) SARS-CoV-2 target nucleic acids are NOT DETECTED.  The SARS-CoV-2 RNA is generally detectable in upper respiratory specimens during the acute phase of infection. The lowest concentration of SARS-CoV-2 viral copies  this assay can detect is 138 copies/mL. Ashantee Deupree negative result does not preclude SARS-Cov-2 infection  and should not be used as the sole basis for treatment or other patient management decisions. Yanina Knupp negative result may occur with  improper specimen collection/handling, submission of specimen other than nasopharyngeal swab, presence of viral mutation(s) within the areas targeted by this assay, and inadequate number of viral copies(<138 copies/mL). Maneh Sieben negative result must be combined with clinical observations, patient history, and epidemiological information. The expected result is Negative.  Fact Sheet for Patients:  EntrepreneurPulse.com.au  Fact Sheet for Healthcare Providers:  IncredibleEmployment.be  This test is no t yet approved or cleared by the [place] FDA and  has been authorized for detection and/or diagnosis of SARS-CoV-2 by FDA under an Emergency Use Authorization (EUA). This EUA will remain  in effect (meaning this test can be used) for the duration of the COVID-19 declaration under Section 564(b)(1) of the Act, 21 U.S.C.section 360bbb-3(b)(1), unless the authorization is terminated  or revoked sooner.       Influenza Naseem Varden by PCR NEGATIVE NEGATIVE Final   Influenza B by PCR NEGATIVE NEGATIVE Final    Comment: (NOTE) The Xpert Xpress SARS-CoV-2/FLU/RSV plus assay is intended as an aid in the diagnosis of influenza from Nasopharyngeal swab specimens and should not be used as Eve Rey sole basis for treatment. Nasal washings and aspirates are unacceptable for Xpert Xpress SARS-CoV-2/FLU/RSV testing.  Fact Sheet for Patients: EntrepreneurPulse.com.au  Fact Sheet for Healthcare Providers: IncredibleEmployment.be  This test is not yet approved or cleared by the [place] FDA and has been authorized for detection and/or diagnosis of SARS-CoV-2 by FDA under an Emergency Use Authorization (EUA). This EUA  will remain in effect (meaning this test can be used) for the duration of the COVID-19 declaration under Section 564(b)(1) of the Act, 21 U.S.C. section 360bbb-3(b)(1), unless the authorization is terminated or revoked.  Performed at [facility], [place] [address], [place], [place] [address]   Culture, blood (routine x 2)     Status: Abnormal   Collection Time: 09/25/20  9:46 PM   Specimen: BLOOD LEFT HAND  Result Value Ref Range Status   Specimen Description BLOOD LEFT HAND  Final   Special Requests AEROBIC BOTTLE ONLY Blood Culture adequate volume  Final   Culture  Setup Time   Final    GRAM NEGATIVE RODS AEROBIC BOTTLE ONLY CRITICAL RESULT CALLED TO, READ BACK BY AND VERIFIED WITH: PHARMD FRANK WILSON 09/27/20'@00'$ :40 BY TW Performed at [facility], [place] [address], [place], [place] [address]    Culture SERRATIA MARCESCENS (Renley Banwart)  Final   Report Status 09/28/2020 FINAL  Final   Organism ID, Bacteria SERRATIA MARCESCENS  Final      Susceptibility   Serratia marcescens - MIC*    CEFAZOLIN >=64 RESISTANT Resistant     CEFEPIME <=0.12 SENSITIVE Sensitive     CEFTAZIDIME <=1 SENSITIVE Sensitive     CEFTRIAXONE <=0.25 SENSITIVE Sensitive     CIPROFLOXACIN <=0.25 SENSITIVE Sensitive     GENTAMICIN <=1 SENSITIVE Sensitive     TRIMETH/SULFA <=20 SENSITIVE Sensitive     * SERRATIA MARCESCENS  Culture, blood (routine x 2)     Status: None   Collection Time: 09/25/20  9:46 PM   Specimen: BLOOD RIGHT HAND  Result Value Ref Range Status   Specimen Description BLOOD RIGHT HAND  Final   Special Requests AEROBIC BOTTLE ONLY Blood Culture adequate volume  Final   Culture   Final    NO GROWTH 5 DAYS Performed at [facility], [place]  [address], [place], [place] [address]    Report Status 09/30/2020 FINAL  Final  Blood Culture ID Panel (Reflexed)     Status: Abnormal   Collection Time: 09/25/20  9:46 PM  Result Value Ref Range Status   Enterococcus faecalis NOT DETECTED NOT  DETECTED Final   Enterococcus Faecium NOT DETECTED NOT DETECTED Final   Listeria monocytogenes NOT DETECTED NOT DETECTED Final   Staphylococcus species NOT DETECTED NOT DETECTED Final   Staphylococcus aureus (BCID) NOT DETECTED NOT DETECTED Final   Staphylococcus epidermidis NOT DETECTED NOT DETECTED Final   Staphylococcus lugdunensis NOT DETECTED NOT DETECTED Final   Streptococcus species NOT DETECTED NOT DETECTED Final   Streptococcus agalactiae NOT DETECTED NOT DETECTED Final   Streptococcus pneumoniae NOT DETECTED NOT DETECTED Final   Streptococcus pyogenes NOT DETECTED NOT DETECTED Final   Joshoa Shawler.calcoaceticus-baumannii NOT DETECTED NOT DETECTED Final   Bacteroides fragilis NOT DETECTED NOT DETECTED Final   Enterobacterales DETECTED (Dawne Casali) NOT DETECTED Final    Comment: Enterobacterales represent Covey Baller large order of gram negative bacteria, not Ova Meegan single organism. CRITICAL RESULT CALLED TO, READ BACK BY AND VERIFIED WITH: PHARMD FRANK WILSON 09/27/20'@00'$ :40 BY TW    Enterobacter cloacae complex NOT DETECTED NOT DETECTED Final   Escherichia coli NOT DETECTED NOT DETECTED Final   Klebsiella aerogenes NOT DETECTED NOT DETECTED Final   Klebsiella oxytoca NOT DETECTED NOT DETECTED Final   Klebsiella pneumoniae NOT DETECTED NOT DETECTED Final   Proteus species NOT DETECTED NOT DETECTED Final   Salmonella species NOT DETECTED NOT DETECTED Final   Serratia marcescens DETECTED (Stephanie Mcglone) NOT DETECTED Final    Comment: CRITICAL RESULT CALLED TO, READ BACK BY AND VERIFIED WITH: PHARMD FRANK WILSON 09/27/20'@00'$ :40 BY TW    Haemophilus influenzae NOT DETECTED NOT DETECTED Final   Neisseria meningitidis NOT DETECTED NOT DETECTED Final   Pseudomonas aeruginosa NOT DETECTED NOT DETECTED Final   Stenotrophomonas maltophilia NOT DETECTED NOT DETECTED Final   Candida albicans NOT DETECTED NOT DETECTED Final   Candida auris NOT DETECTED NOT DETECTED Final   Candida glabrata NOT DETECTED NOT DETECTED Final   Candida  krusei NOT DETECTED NOT DETECTED Final   Candida parapsilosis NOT DETECTED NOT DETECTED Final   Candida tropicalis NOT DETECTED NOT DETECTED Final   Cryptococcus neoformans/gattii NOT DETECTED NOT DETECTED Final   CTX-M ESBL NOT DETECTED NOT DETECTED Final   Carbapenem resistance IMP NOT DETECTED NOT DETECTED Final   Carbapenem resistance KPC NOT DETECTED NOT DETECTED Final   Carbapenem resistance NDM NOT DETECTED NOT DETECTED Final   Carbapenem resist OXA 48 LIKE NOT DETECTED NOT DETECTED Final   Carbapenem resistance VIM NOT DETECTED NOT DETECTED Final    Comment: Performed at [facility], [address] [place], [place] [address]  Urine Culture     Status: None   Collection Time: 09/26/20 11:06 AM   Specimen: Urine, Catheterized  Result Value Ref Range Status   Specimen Description URINE, CATHETERIZED  Final   Special Requests NONE  Final   Culture   Final    NO GROWTH Performed at [facility], [address], [place], [place] [address]    Report Status 09/27/2020 FINAL  Final  Culture, blood (routine x 2)     Status: None (Preliminary result)   Collection Time: 09/27/20  8:17 AM   Specimen: BLOOD  Result Value Ref Range Status   Specimen Description BLOOD LEFT ANTECUBITAL  Final   Special Requests   Final    BOTTLES DRAWN AEROBIC  AND ANAEROBIC Blood Culture adequate volume   Culture   Final    NO GROWTH 4 DAYS Performed at [facility] [address], [place], [place] [address]    Report Status PENDING  Incomplete  Culture, blood (routine x 2)     Status: None (Preliminary result)   Collection Time: 09/27/20  8:19 AM   Specimen: BLOOD  Result Value Ref Range Status   Specimen Description BLOOD BLOOD RIGHT HAND  Final   Special Requests   Final    BOTTLES DRAWN AEROBIC AND ANAEROBIC Blood Culture adequate volume   Culture   Final    NO GROWTH 4 DAYS Performed at [facility], [place] [address] [place], [place] [address]    Report Status  PENDING  Incomplete         Radiology Studies: DG CHEST PORT 1 VIEW  Result Date: 09/30/2020 CLINICAL DATA:  Altered mental status, congestion. EXAM: PORTABLE CHEST 1 VIEW COMPARISON:  Comparison made with September 29, 2020. FINDINGS: Post median sternotomy for CABG as before. Post RIGHT coronary artery stenting. Cardiomediastinal contours and hilar structures are stable. EKG leads project over the chest. Lungs are clear. No pneumothorax. No sign of pleural effusion on frontal radiograph. On limited assessment there is no acute skeletal process. IMPRESSION: No acute cardiopulmonary disease. Electronically Signed   By: Zetta Bills M.D.   On: 09/30/2020 16:33        Scheduled Meds:  buPROPion  150 mg Oral q morning   Chlorhexidine Gluconate Cloth  6 each Topical Daily   docusate sodium  100 mg Oral BID   DULoxetine  60 mg Oral Daily   feeding supplement (GLUCERNA SHAKE)  237 mL Oral TID BM   influenza vaccine adjuvanted  0.5 mL Intramuscular Tomorrow-1000   insulin aspart  0-20 Units Subcutaneous TID WC   insulin detemir  20 Units Subcutaneous Q2200   isosorbide mononitrate  30 mg Oral Daily   ketorolac  1 drop Right Eye Daily   magnesium oxide  400 mg Oral BID   mouth rinse  15 mL Mouth Rinse BID   melatonin  3 mg Oral QHS   metoprolol succinate  25 mg Oral Daily   multivitamin with minerals  1 tablet Oral Daily   nystatin  5 mL Oral QID   omega-3 acid ethyl esters  1 g Oral Daily   pantoprazole  40 mg Oral QHS   polyethylene glycol  17 g Oral Daily   rosuvastatin  20 mg Oral Q2200   senna  1 tablet Oral BID   tamsulosin  0.4 mg Oral Daily   vitamin B-12  1,000 mcg Oral Daily   Continuous Infusions:  ceFEPime (MAXIPIME) IV 2 g (10/01/20 1411)     LOS: 13 days    Time spent: over 30 min    Fayrene Helper, MD [facility]   To contact the attending provider between 7A-7P or the covering provider during after hours 7P-7A, please log into the web site  [email or web address] and access using universal [place] password for that web site. If you do not have the password, please call the hospital operator.  10/01/2020, 3:33 PM

## 2020-10-01 NOTE — Progress Notes (Signed)
  NEUROSURGERY PROGRESS NOTE   No issues overnight.   EXAM:  BP (!) 149/97   Pulse 100   Temp 98.4 F (36.9 C) (Oral)   Resp 15   Ht '5\' 9"'$  (1.753 m)   Wt 93.3 kg   SpO2 97%   BMI 30.38 kg/m   Awake, alert, not responding verbally but does follow simple commands  CN grossly intact  Moving BLE good strength Wound stable from Friday with minimal erythema, minimal to no drainage and very superficial dehiscence  IMPRESSION:  85 y.o. male s/p lumbar fusion with serratia bacteremia now cleared (culture 9/8 negative to date). Wound looks stable, does not appear infected.  Delirium, etiology unclear  PLAN: - Cont to monitor mental status - Do not see any need for operative wound exploration/debridement - Cont abx   Consuella Lose, MD [facility]

## 2020-10-01 NOTE — Progress Notes (Signed)
1500 Pt sat up in chair for about an hour, affect calm and pleasant.  1620 Telesitter initiated to decrease use of restraints. Pt tolerated well for about 1.5 and then became agitated and combative with staff. Pt  attempting to get out of bed, behavior erratic and irritable. Staff placed pt back in restraints so that he wouldn't injure himself or staff.  Will update CN and Provider.

## 2020-10-01 NOTE — Progress Notes (Signed)
Physical Therapy Treatment Patient Details Name: Ricky Nelson MRN: [id number] DOB: [birth year] Today's Date: 10/01/2020   History of Present Illness Pt is an 85 y/o male admitted to [facility] on 8/30 for L2-4 fusion with extension of hardware L2-L5. PMH significant for HTN, DM2, Bladder cancer, MI in 1995, Sleep apnea.    PT Comments    Patient progressing this session as more alert and following commands more automatically for mobility.  He is not able to follow spinal precautions and needs assistance for safety as he continues to have buckling in knees while standing.  Feel Stedy appropriate for OOB transfers.  PT to continue to follow acutely and agree with follow up SNF level rehab at d/c.     Recommendations for follow up therapy are one component of a multi-disciplinary discharge planning process, led by the attending physician.  Recommendations may be updated based on patient status, additional functional criteria and insurance authorization.  Follow Up Recommendations  SNF;Supervision/Assistance - 24 hour     Equipment Recommendations  Rolling walker with 5" wheels    Recommendations for Other Services       Precautions / Restrictions Precautions Precautions: Back;Fall Required Braces or Orthoses: Spinal Brace Spinal Brace: Lumbar corset;Applied in sitting position     Mobility  Bed Mobility Overal bed mobility: Needs Assistance Bed Mobility: Supine to Sit     Supine to sit: Supervision     General bed mobility comments: initiated with cues and able to get up without physical help using rail with increased time, supervision assist for safety and lines    Transfers Overall transfer level: Needs assistance Equipment used: Rolling walker (2 wheeled) Transfers: Sit to/from [facility] Sit to Stand: Min assist;+2 physical assistance Stand pivot transfers: Mod assist;+2 physical assistance       General transfer comment: up into standing with little  lifting help, stands briefly then returns to sit due to knees buckline; assisted with hand under his hips and pt able to take side steps toward [facility] with knees briefly giving out then with assist under hip can regain his stability.  Stand step to recliner with increased time mod cues for stepping/sequencing and help under hips to prevent LOB when knees buckle  Ambulation/Gait                 Stairs             Wheelchair Mobility    Modified Rankin (Stroke Patients Only)       Balance Overall balance assessment: Needs assistance   Sitting balance-Leahy Scale: Fair Sitting balance - Comments: EOB with S for safety   Standing balance support: Bilateral upper extremity supported Standing balance-Leahy Scale: Poor Standing balance comment: UE support and +2 A for safety                            Cognition Arousal/Alertness: Awake/alert Behavior During Therapy: Restless Overall Cognitive Status: Impaired/Different from baseline Area of Impairment: Orientation;Attention;Memory;Following commands;Safety/judgement;Problem solving                 Orientation Level: Disoriented to;Place;Time;Situation Current Attention Level: Focused   Following Commands: Follows one step commands consistently;Follows one step commands with increased time Safety/Judgement: Decreased awareness of safety;Decreased awareness of deficits   Problem Solving: Slow processing;Requires verbal cues;Requires tactile cues General Comments: Reports he is on a boat      Exercises      General Comments  Pertinent Vitals/Pain Pain Assessment: Faces Faces Pain Scale: No hurt    Home Living                      Prior Function            PT Goals (current goals can now be found in the care plan section) Progress towards PT goals: Progressing toward goals    Frequency    Min 3X/week      PT Plan Current plan remains appropriate;Frequency needs to  be updated    Co-evaluation              AM-PAC PT "6 Clicks" Mobility   Outcome Measure  Help needed turning from your back to your side while in a flat bed without using bedrails?: A Little Help needed moving from lying on your back to sitting on the side of a flat bed without using bedrails?: A Little Help needed moving to and from a bed to a chair (including a wheelchair)?: A Lot Help needed standing up from a chair using your arms (e.g., wheelchair or bedside chair)?: A Lot Help needed to walk in hospital room?: Total Help needed climbing 3-5 steps with a railing? : Total 6 Click Score: 12    End of Session Equipment Utilized During Treatment: Back brace Activity Tolerance: Patient tolerated treatment well Patient left: in chair;with call bell/phone within reach;with chair alarm set Nurse Communication: Mobility status;Need for lift equipment PT Visit Diagnosis: Other abnormalities of gait and mobility (R26.89);Muscle weakness (generalized) (M62.81);Other symptoms and signs involving the nervous system (R29.898)     Time: [id number] PT Time Calculation (min) (ACUTE ONLY): 28 min  Charges:  $Therapeutic Activity: 23-37 mins                     Magda Kiel, PT Acute Rehabilitation Services [id number] Office:[phone] 10/01/2020    Reginia Naas 10/01/2020, 6:09 PM

## 2020-10-01 NOTE — Progress Notes (Signed)
RT NOTE:  Pt unable to wear CPAP due to wrist restraints.

## 2020-10-01 NOTE — Progress Notes (Signed)
Occupational Therapy Treatment Patient Details Name: Ricky Nelson MRN: [id number] DOB: 26-Dec-1935 Today's Date: 10/01/2020   History of present illness Pt is an 85 y/o male admitted to [facility] on 8/30 for L2-4 fusion with extension of hardware L2-L5. PMH significant for HTN, DM2, Bladder cancer, MI in 1995, Sleep apnea.   OT comments  Pt presented with decreased arousal and lethargy today upon arrival. Pt with decreased ability to follow simple commands and with incoherent mumbling throughout session. Pt required total A for bed mobility. Pt standing twice from EOB with Max A +2, support from RW, and max cues; pt with knee buckling during second stand and requiring guided assist back to EOB safely. Continuing to recommend SNF with 24 hr supervision assistance due to pt needing increased assistance for all ADLs. Will continue OT in the acute setting.    Recommendations for follow up therapy are one component of a multi-disciplinary discharge planning process, led by the attending physician.  Recommendations may be updated based on patient status, additional functional criteria and insurance authorization.    Follow Up Recommendations  SNF;Supervision/Assistance - 24 hour    Equipment Recommendations  Other (comment) (RW)    Recommendations for Other Services      Precautions / Restrictions Precautions Precautions: Back;Fall Precaution Booklet Issued: No Required Braces or Orthoses: Spinal Brace Spinal Brace: Lumbar corset;Applied in sitting position Restrictions Weight Bearing Restrictions: No       Mobility Bed Mobility Overal bed mobility: Needs Assistance Bed Mobility: Rolling;Sidelying to Sit;Sit to Sidelying Rolling: Total assist;+2 for physical assistance Sidelying to sit: Total assist;+2 for physical assistance     Sit to sidelying: Total assist;+2 for physical assistance General bed mobility comments: Pt unable to initiate or follow commands to complete log rolling to  sit EOB. Required total physical assist.    Transfers Overall transfer level: Needs assistance Equipment used: Rolling walker (2 wheeled);2 person hand held assist Transfers: Sit to/from Stand Sit to Stand: Max assist;+2 safety/equipment;+2 physical assistance         General transfer comment: unable to attempt mobility secondary to buckling without warning and unable to correct. Above information    Balance Overall balance assessment: Needs assistance Sitting-balance support: Feet supported;Bilateral upper extremity supported Sitting balance-Leahy Scale: Poor Sitting balance - Comments: Pt required Max A physical assistance to support trunk sitting EOB Postural control: Posterior lean Standing balance support: Bilateral upper extremity supported;During functional activity Standing balance-Leahy Scale: Poor Standing balance comment: Reliant on UE support, RW, and 2+ assistance. Knees buckled during standing and guided back to sit EOB.                           ADL either performed or assessed with clinical judgement   ADL Overall ADL's : Needs assistance/impaired     Grooming: Wash/dry face;Bed level;set up Grooming Details (indicate cue type and reason): Pt required set up with verbal cues to wash face and given wet washcloth.             Lower Body Dressing: Maximal assistance;Adhering to back precautions Lower Body Dressing Details (indicate cue type and reason): Max A to donn socks EOB for physical assist and sequencing, able to lift one foot after being cued. Toilet Transfer: Maximal assistance;RW;Cueing for sequencing Toilet Transfer Details (indicate cue type and reason): Pt required Max A for power up in to standing from sit<>stand EOB. (simulated EOB)         Functional mobility  during ADLs: Maximal assistance;Rolling walker;+2 for physical assistance General ADL Comments: Pt with increased lethargy this session limiting ability to complete  functional mobility and self care tasks. Pt Max A x2 for sit<>stand for power up. Pt with knee buckling in standing and guided to sitting EOB.     Vision   Vision Assessment?: No apparent visual deficits Additional Comments: Pt intermittently closing eyes throughout session and appeared to be falling asleep.   Perception     Praxis      Cognition Arousal/Alertness: Lethargic Behavior During Therapy: Restless Overall Cognitive Status: Impaired/Different from baseline Area of Impairment: Orientation;Attention;Following commands;Safety/judgement;Awareness;Memory                 Orientation Level: Person;Place;Time;Situation;Disoriented to Current Attention Level: Focused Memory: Decreased recall of precautions;Decreased short-term memory Following Commands: Follows one step commands inconsistently Safety/Judgement: Decreased awareness of safety;Decreased awareness of deficits Awareness: Intellectual Problem Solving: Difficulty sequencing;Requires verbal cues;Requires tactile cues General Comments: Pt with increased lethargy and arousal from previous visits. Pt inconsistently responding to questions and cues with mumbling and incoherent responses. Pt also falling asleep intermittently througout session. Pt unable to assist with bed mobility and required Max A for sit<>stands.        Exercises     Shoulder Instructions       General Comments Son present    Pertinent Vitals/ Pain       Pain Assessment: No/denies pain Faces Pain Scale: No hurt  Home Living                                          Prior Functioning/Environment              Frequency  Min 2X/week        Progress Toward Goals  OT Goals(current goals can now be found in the care plan section)  Progress towards OT goals: Progressing toward goals  Acute Rehab OT Goals Patient Stated Goal: did not state OT Goal Formulation: With patient/family Time For Goal Achievement:  10/03/20 Potential to Achieve Goals: Good ADL Goals Pt Will Perform Lower Body Bathing: with modified independence;with adaptive equipment;sitting/lateral leans;sit to/from stand Pt Will Perform Lower Body Dressing: with modified independence;with adaptive equipment;sitting/lateral leans;sit to/from stand Pt Will Transfer to Toilet: with modified independence;ambulating Pt Will Perform Toileting - Clothing Manipulation and hygiene: with modified independence;with adaptive equipment;sitting/lateral leans;sit to/from stand  Plan Frequency remains appropriate    Co-evaluation                 AM-PAC OT "6 Clicks" Daily Activity     Outcome Measure   Help from another person eating meals?: A Lot Help from another person taking care of personal grooming?: A Lot Help from another person toileting, which includes using toliet, bedpan, or urinal?: A Lot Help from another person bathing (including washing, rinsing, drying)?: A Lot Help from another person to put on and taking off regular upper body clothing?: A Lot Help from another person to put on and taking off regular lower body clothing?: A Lot 6 Click Score: 12    End of Session    OT Visit Diagnosis: Unsteadiness on feet (R26.81);Muscle weakness (generalized) (M62.81);Other abnormalities of gait and mobility (R26.89);Pain   Activity Tolerance     Patient Left     Nurse Communication          Time: [id number] OT Time Calculation (  min): 39 min  Charges: OT General Charges $OT Visit: 1 Visit OT Treatments $Self Care/Home Management : 38-52 mins  Jackquline Denmark, OTS Acute Rehab Office: [phone]   Noland Pizano 10/01/2020, 12:02 PM

## 2020-10-02 DIAGNOSIS — B370 Candidal stomatitis: Secondary | ICD-10-CM

## 2020-10-02 DIAGNOSIS — B002 Herpesviral gingivostomatitis and pharyngotonsillitis: Secondary | ICD-10-CM

## 2020-10-02 DIAGNOSIS — L740 Miliaria rubra: Secondary | ICD-10-CM

## 2020-10-02 DIAGNOSIS — L22 Diaper dermatitis: Secondary | ICD-10-CM | POA: Diagnosis not present

## 2020-10-02 DIAGNOSIS — A4153 Sepsis due to Serratia: Secondary | ICD-10-CM | POA: Diagnosis not present

## 2020-10-02 DIAGNOSIS — Z967 Presence of other bone and tendon implants: Secondary | ICD-10-CM

## 2020-10-02 DIAGNOSIS — Z9889 Other specified postprocedural states: Secondary | ICD-10-CM

## 2020-10-02 LAB — COMPREHENSIVE METABOLIC PANEL
ALT: 26 U/L (ref 0–44)
AST: 24 U/L (ref 15–41)
Albumin: 2.6 g/dL — ABNORMAL LOW (ref 3.5–5.0)
Alkaline Phosphatase: 77 U/L (ref 38–126)
Anion gap: 13 (ref 5–15)
BUN: 37 mg/dL — ABNORMAL HIGH (ref 8–23)
CO2: 23 mmol/L (ref 22–32)
Calcium: 9.2 mg/dL (ref 8.9–10.3)
Chloride: 97 mmol/L — ABNORMAL LOW (ref 98–111)
Creatinine, Ser: 1.87 mg/dL — ABNORMAL HIGH (ref 0.61–1.24)
GFR, Estimated: 35 mL/min — ABNORMAL LOW (ref >60)
Glucose, Bld: 182 mg/dL — ABNORMAL HIGH (ref 70–99)
Potassium: 4.5 mmol/L (ref 3.5–5.1)
Sodium: 133 mmol/L — ABNORMAL LOW (ref 135–145)
Total Bilirubin: 0.8 mg/dL (ref 0.3–1.2)
Total Protein: 7.5 g/dL (ref 6.5–8.1)

## 2020-10-02 LAB — GLUCOSE, CAPILLARY
Glucose-Capillary: 235 mg/dL — ABNORMAL HIGH (ref 70–99)
Glucose-Capillary: 208 mg/dL — ABNORMAL HIGH (ref 70–99)
Glucose-Capillary: 208 mg/dL — ABNORMAL HIGH (ref 70–99)
Glucose-Capillary: 166 mg/dL — ABNORMAL HIGH (ref 70–99)
Glucose-Capillary: 141 mg/dL — ABNORMAL HIGH (ref 70–99)
Glucose-Capillary: 183 mg/dL — ABNORMAL HIGH (ref 70–99)
Glucose-Capillary: 158 mg/dL — ABNORMAL HIGH (ref 70–99)

## 2020-10-02 LAB — CBC WITH DIFFERENTIAL/PLATELET
Abs Immature Granulocytes: 0.08 10*3/uL — ABNORMAL HIGH (ref 0.00–0.07)
Basophils Absolute: 0 10*3/uL (ref 0.0–0.1)
Basophils Relative: 0 %
Eosinophils Absolute: 0.3 10*3/uL (ref 0.0–0.5)
Eosinophils Relative: 2 %
HCT: 31.5 % — ABNORMAL LOW (ref 39.0–52.0)
Hemoglobin: 9.9 g/dL — ABNORMAL LOW (ref 13.0–17.0)
Immature Granulocytes: 1 %
Lymphocytes Relative: 10 %
Lymphs Abs: 1 10*3/uL (ref 0.7–4.0)
MCH: 29.8 pg (ref 26.0–34.0)
MCHC: 31.4 g/dL (ref 30.0–36.0)
MCV: 94.9 fL (ref 80.0–100.0)
Monocytes Absolute: 1.1 10*3/uL — ABNORMAL HIGH (ref 0.1–1.0)
Monocytes Relative: 10 %
Neutro Abs: 8.2 10*3/uL — ABNORMAL HIGH (ref 1.7–7.7)
Neutrophils Relative %: 77 %
Platelets: 251 10*3/uL (ref 150–400)
RBC: 3.32 MIL/uL — ABNORMAL LOW (ref 4.22–5.81)
RDW: 14.3 % (ref 11.5–15.5)
WBC: 10.7 10*3/uL — ABNORMAL HIGH (ref 4.0–10.5)
nRBC: 0 % (ref 0.0–0.2)

## 2020-10-02 LAB — CULTURE, BLOOD (ROUTINE X 2)
Culture: NO GROWTH
Culture: NO GROWTH
Special Requests: ADEQUATE
Special Requests: ADEQUATE

## 2020-10-02 LAB — MAGNESIUM: Magnesium: 2.3 mg/dL (ref 1.7–2.4)

## 2020-10-02 LAB — T4, FREE: Free T4: 1.17 ng/dL — ABNORMAL HIGH (ref 0.61–1.12)

## 2020-10-02 LAB — TSH: TSH: 3.198 u[IU]/mL (ref 0.350–4.500)

## 2020-10-02 LAB — PHOSPHORUS: Phosphorus: 3.6 mg/dL (ref 2.5–4.6)

## 2020-10-02 MED ORDER — SODIUM CHLORIDE 0.9 % IV SOLN: 2.0000 g | Freq: Two times a day (BID) | INTRAVENOUS | Status: DC

## 2020-10-02 MED ORDER — SODIUM CHLORIDE 0.9 % IV SOLN
1.0000 g | INTRAVENOUS | Status: DC
  Administered 2020-10-03 – 2020-10-04 (×2): 1000 mg via INTRAVENOUS
  Filled 2020-10-02 (×2): qty 1

## 2020-10-02 MED ORDER — SODIUM CHLORIDE 0.9 % IV SOLN: INTRAVENOUS | Status: DC

## 2020-10-02 MED ORDER — FLUCONAZOLE 200 MG PO TABS
200.0000 mg | ORAL_TABLET | Freq: Every day | ORAL | Status: DC
  Administered 2020-10-02 – 2020-10-11 (×10): 200 mg via ORAL
  Filled 2020-10-02 (×10): qty 1

## 2020-10-02 MED ORDER — DEXTROSE 5 % IV SOLN
10.0000 mg/kg | Freq: Two times a day (BID) | INTRAVENOUS | Status: DC
  Filled 2020-10-02 (×2): qty 18.6

## 2020-10-02 MED ORDER — VALACYCLOVIR HCL 500 MG PO TABS
1000.0000 mg | ORAL_TABLET | Freq: Two times a day (BID) | ORAL | Status: DC
  Administered 2020-10-02 – 2020-10-03 (×3): 1000 mg via ORAL
  Filled 2020-10-02 (×4): qty 2

## 2020-10-02 NOTE — Progress Notes (Signed)
  NEUROSURGERY PROGRESS NOTE   No issues overnight. Pt awake and talking to me this am, but confused.  EXAM:  BP (!) 151/77   Pulse 96   Temp (!) 97.2 F (36.2 C) (Oral)   Resp 18   Ht '5\' 9"'$  (1.753 m)   Wt 93 kg   SpO2 96%   BMI 30.28 kg/m   Awake, alert, oriented x 1 Speech fluent CN grossly intact  5/5 BUE/BLE  Wound appears stable, no drainage.  IMPRESSION:  85 y.o. male s/p lumbar fusion, postop course complicated by serratia bacteremia, now cleared but with persistent delirium  Appeared to do better with PT yesterday evening  PLAN: - Complete abx course - Cont PT/OT   Consuella Lose, MD [facility] ][

## 2020-10-02 NOTE — Progress Notes (Addendum)
Pharmacy Antibiotic Note  Ricky Nelson is a 85 y.o. male admitted on 09/18/2020 under the Neurosurgery Service after undergoing L2-L4 decompression/fusion on 09/18/20. Pt  with oral herpes simplex/ulcers, for which he was on valacyclovir, which was inadvertently discontinued yesterday. Today, oral lesions are much worse and pt has pain on swallowing, with esophagitis suspected. 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  WBC up to 10.7, Tmax 99.5 F; Scr 1.87, CrCl 33.7 ml/min (renal function ~stable)  Plan: Acyclovir 10 mg/kg TBW (930 mg) IV Q 12 hrs (using high-dose acyclovir, due to concern for disseminated HSV infection) Normal saline at 50 ml/hr Monitor WBC, temp, clinical course, renal function (monitor BMET daily for first 3 days of tx), neurotoxicity (confusion, agitation, hallucination)  Height: '5\' 9"'$  (175.3 cm) Weight: 93 kg (205 lb 0.4 oz) IBW/kg (Calculated) : 70.7  Temp (24hrs), Avg:98.5 F (36.9 C), Min:97.2 F (36.2 C), Max:99.5 F (37.5 C)  Recent Labs  Lab 09/26/20 0213 09/26/20 0443 09/27/20 0352 09/28/20 0151 09/29/20 0228 09/30/20 0511 10/01/20 0355 10/02/20 0327  WBC  --   --    < > 7.4 7.3 10.4 9.1 10.7*  CREATININE  --   --    < > 1.73* 1.54* 1.89* 1.78* 1.87*  LATICACIDVEN 1.5 1.0  --   --   --   --   --   --    < > = values in this interval not displayed.    Estimated Creatinine Clearance: 33.7 mL/min (A) (by C-G formula based on SCr of 1.87 mg/dL (H)).    Allergies  Allergen Reactions   Allergy Relief [Chlorpheniramine Maleate] Shortness Of Breath   Diazepam     Other reaction(s): Hypotension, Low blood pressure, Other (See Comments)   Diphenhydramine Hcl Shortness Of Breath    Other reaction(s): Other (See Comments), Stopped breathing Other reaction(s): Shortness Of Breath Shortness of breath with benadryl    Diphenhydramine-Zinc Acetate Shortness Of Breath    Atorvastatin Other (See Comments)    Other reaction(s): Muscle pain, Other (See Comments), Unknown Elevated CK      Fluvastatin Other (See Comments)    Other reaction(s): Other (See Comments) myalgias   Lisinopril     Other reaction(s): Hyperkalemia, Other (See Comments)   Metformin And Related     Other reaction(s): Other Renal function   Piperacillin-Tazobactam In Dex Rash    Other reaction(s): Rash Other reaction(s): Rash    Diphenhydramine     Other reaction(s): Low blood pressure   Other Other (See Comments)    Lescol    Antimicrobials this admission: Cefazolin 8/30-8/31 Cefepime 9/7 >> Vancomycin 9/9-9/12 Valacyclovir 9/12 IV Acyclovir 9/13 >>  Microbiology results: 9/6 COVID, flu A, flu B: negative 9/6 Bld cx: Serratia marcesens (treated with cefepime) 9/7 Urine cx: NG/final 9/8 Bld cx: NG/final  Thank you for allowing pharmacy to be a part of this patient's care.  Gillermina Hu, PharmD, BCPS, [facility] Clinical Pharmacist 10/02/2020 3:09 PM

## 2020-10-02 NOTE — Progress Notes (Addendum)
Consult NOTE    Ricky Nelson  [id number] DOB: December 06, 1935 DOA: 09/18/2020 PCP: Pcp, No   No chief complaint on file.  Brief Narrative:  [name] is Ricky Nelson 85 y.o. male with medical history significant for CAD, T2DM, CKD stage IIIb, HTN, OSA on CPAP who was admitted under the neurosurgery service after undergoing L2-L4 decompression/fusion on 09/18/2020.  Patient had new fever developing 9/6  with transient episodes of tachycardia and hypotension.  No clear infectious source.  Surgical site appears clean.  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  Blood cultures and urine cultures were ordered but still pending collection.  He had serratia bacteremia.  ID saw and initially recommended 7 days of abx.   Course complicated by delirium which has been persistent.    He's now got oral lesions and pain with swallowing concerning for HSV.   ID reconsulted.    Assessment & Plan:   Active Problems:   Lumbar spinal stenosis   Sleep apnea   Diabetes mellitus without complication (HCC)   Coronary artery disease   CKD (chronic kidney disease)   SIRS (systemic inflammatory response syndrome) (HCC)   Hypoxia   Pleural effusion   Delirium  Oral Herpes Simplex  Oral Ulcers Valtrex appears to have been inadvertently d/c'd yesterday Oral lesions much worse, c/o pain with swallowing Suspect esophagitis is likely  Start acyclovir Lidocaine, nystatin Will discuss with ID Poor PO intake related to this, follow closely  Maculopapular Rash with Crusting Back with new rash, worsened in past day or so Raised, has some crusting  Contact dermatitis given presence primarily on back With worsening oral lesions, disemminated HSV is on ddx  Will reconsult ID  Acute encephalopathy  Concern for neurocognitive deficit: Persistent today Presumed 2/2 serratia bacteremia, surgery, acute hospitalization, etc ammonia (wnl).  Follow B12, folate, TSH (mildly elevated -> repeat).  VBG without hypercarbia. Minimize sedating  medications, hold narcotics, Robaxin.  Hold gabapentin for now. delirium precautions - will allow family to stay at bedside overnight - notably wife notes symptoms concerning for sundowning occasionally at home - ? Dementia, not diagnosed Follow EKG for QT eval - consider seroquel (plan for trazodone right now) addendum - qt prolonged, hold qt prolonging agens -> trial melatonin, ativan qhs prn Delirium has been prolonged, concerning -> discussed potential to have to discuss goals of care/involving palliative if he's not improving Try to minimize use of restraints - utilize sitter, telesitter, family when possible   Sepsis 2/2 Serratia Marcescens Bacteremia: Fever with T-max 103.7 F rectally on 9/6. Also with reported rigors. associated tachycardia, tachypnea, transient hypotension Now with 1/2 sets blood cultures showing serratia Repeat blood cultures from 9/8 pending  Source not clear -> urinary vs pulmonary  Urine cx no growth (after abx) - UA with rare bacteria, 11-20 WBC's (collected late) CXR notable for mild interstitial edema and R pleural effusion COVID and influenza negative.   Continue cefepime - plan for 7 days ID c/s, appreciate recommendations - recommending 7 days abx (minimal concern for wound infection per discussion with NSGY, d/c vanc)  Acute Hypoxic Respiratory Failure  History of COPD  Mild Pulmonary Edema   Heart Failure Exacerbation - could be related to COPD (though not chronically on O2), small R effusion on CT scan could represent pna - pneumonia +/- HF exacerbation - CT without visible PE, small R effusion - CXR 9/11 without acute CP disease - currently on RA - follow echo -> EF 50-55%, inferolateral hypokinesis, grade II diastolic dysfunction - moderately elevated  PASP, mildly reduced RVSF - strict I/O, daily weights (he's net negative, weights downtrending) - hold additional IVF for now - s/p 1 dose lasix on 9/10 (he's autodiuresed with net negative 13.8  L) - wean as tolerated, w/u further as indicated - SLP eval   Hyponatremia: With Yolinda Duerr normal serum osm, will continue to monitor Urine sodium 83 Continue to monitor    HFpEF  Inferolateral Hypokinesis Diuresis as noted above Continue metoprolol Consider cardiology for wall motion abnormality - unclear chronicity, no prior echos  ?Presyncope C/o vision going black and tremors/jerking when standing with therapy Needs additional f/u, monitoring BP's look ok, will continue to monitor   S/p L2-L4 decompression/fusion 09/18/2020: Per neurosurgery. Wound doesn't appear infected per Dr. Kathyrn Sheriff   CKD stage IIIb: Creatinine 2.46 at presentation Baseline unclear, as low as 1.58 here Fluctuating, continue to monitor Renal US (without evidence hydro - ? Bladder diverticulum), UA (pending) net negative here  Type 2 diabetes: Hold home glipizide, Jardiance, Victoza and reduce Levemir  Adjust insulin as needed, continue SSI    CAD: Will resume.  Continue Toprol-XL and Imdur as BP allows.   Iron deficiency anemia: S/p 2 unit PRBC for acute blood loss anemia (9/2 and 9/3)   Hypertension: Continue Toprol-XL and Imdur as BP allows.   OSA: Continue CPAP nightly.  Bladder Diverticulum Needs follow up with dedicated CT or [place] as allowed  DVT prophylaxis: SCD Code Status: full  Family Communication: son at bedside Disposition:   Per [place]       Consultants:  [facility]   Procedures: PROCEDURE: 1. L2, L3 laminectomy with facetectomy for decompression of exiting nerve roots, more than would be required for placement of interbody graft 2. Placement of anterior interbody device - Medtronic epandable 60m cage x4 3. Posterior segmental instrumentation using Medtronic Solera pedicle screws and previously place Stryker D90 screws, L2 - L5 4. Interbody arthrodesis, L2-3, L3-4 5. Use of locally harvested bone autograft 6. Use of non-structural bone allograft - ProteOs 7.  Removal of previous rod, L4-5  Antimicrobials: Anti-infectives (From admission, onward)    Start     Dose/Rate Route Frequency Ordered Stop   10/01/20 2200  valACYclovir (VALTREX) tablet 500 mg  Status:  Discontinued        500 mg Oral 2 times daily 10/01/20 1558 10/01/20 1601   10/01/20 1000  vancomycin (VANCOREADY) IVPB 1250 mg/250 mL  Status:  Discontinued        1,250 mg 166.7 mL/hr over 90 Minutes Intravenous Every 48 hours 09/30/20 1223 10/01/20 1237   09/30/20 1300  vancomycin (VANCOREADY) IVPB 750 mg/150 mL  Status:  Discontinued        750 mg 150 mL/hr over 60 Minutes Intravenous Every 24 hours 09/29/20 1144 09/30/20 1221   09/29/20 1230  vancomycin (VANCOREADY) IVPB 2000 mg/400 mL        2,000 mg 200 mL/hr over 120 Minutes Intravenous  Once 09/29/20 1144 09/29/20 1456   09/28/20 0200  vancomycin (VANCOREADY) IVPB 1500 mg/300 mL  Status:  Discontinued        1,500 mg 150 mL/hr over 120 Minutes Intravenous Every 48 hours 09/26/20 0035 09/27/20 1106   09/26/20 1400  ceFEPIme (MAXIPIME) 2 g in sodium chloride 0.9 % 100 mL IVPB        2 g 200 mL/hr over 30 Minutes Intravenous Every 12 hours 09/26/20 0035 10/02/20 1445   09/26/20 0130  vancomycin (VANCOREADY) IVPB 2000 mg/400 mL  2,000 mg 200 mL/hr over 120 Minutes Intravenous STAT 09/26/20 0035 09/26/20 0854   09/26/20 0100  ceFEPIme (MAXIPIME) 2 g in sodium chloride 0.9 % 100 mL IVPB        2 g 200 mL/hr over 30 Minutes Intravenous STAT 09/26/20 0035 09/26/20 0852   09/18/20 2300  ceFAZolin (ANCEF) IVPB 2g/100 mL premix        2 g 200 mL/hr over 30 Minutes Intravenous Every 8 hours 09/18/20 1852 09/19/20 0633   09/18/20 0930  ceFAZolin (ANCEF) IVPB 2g/100 mL premix        2 g 200 mL/hr over 30 Minutes Intravenous On call to O.R. 09/18/20 0925 09/18/20 1520          Subjective: Persistently confused Pleasant today, gregarious Per son spent 2 hrs in chair, better than yesterday  Objective: Vitals:   10/02/20  0500 10/02/20 0700 10/02/20 0936 10/02/20 1115  BP:  (!) 108/94 (!) 151/77 (!) 154/74  Pulse:   96 100  Resp:    18  Temp:  (!) 97.2 F (36.2 C)  98.7 F (37.1 C)  TempSrc:  Oral  Axillary  SpO2:    98%  Weight: 93 kg     Height:        Intake/Output Summary (Last 24 hours) at 10/02/2020 1451 Last data filed at 10/02/2020 0700 Gross per 24 hour  Intake 240 ml  Output --  Net 240 ml    Filed Weights   09/29/20 0454 10/01/20 0437 10/02/20 0500  Weight: 93.3 kg 93.3 kg 93 kg    Examination:  General: No acute distress. Cardiovascular: RRR Lungs: unalbored Abdomen: Soft, nontender, nondistended . Neurological: Pleasantly confused. Moves all extremities 4. Cranial nerves II through XII grossly intact. Skin: oral lesions c/w HSV - maculopapular rash on back with overlying crusting - dressing itnact to lower back  Extremities: No clubbing or cyanosis. No edema.   Data Reviewed: I have personally reviewed following labs and imaging studies  CBC: Recent Labs  Lab 09/27/20 0352 09/28/20 0151 09/29/20 0228 09/30/20 0511 10/01/20 0355 10/02/20 0327  WBC 6.5 7.4 7.3 10.4 9.1 10.7*  NEUTROABS 5.0  --  5.5 8.0* 7.1 8.2*  HGB 8.0* 8.4* 8.6* 9.3* 10.5* 9.9*  HCT 25.1* 26.2* 27.3* 29.5* 33.4* 31.5*  MCV 95.1 94.9 94.5 93.9 95.4 94.9  PLT 171 174 181 226 228 [id number]    Basic Metabolic Panel: Recent Labs  Lab 09/28/20 0151 09/28/20 1209 09/29/20 0228 09/30/20 0511 10/01/20 0355 10/02/20 0327  NA 129*  --  132* 132* 133* 133*  K 4.8  --  4.6 4.4 4.4 4.5  CL 94*  --  94* 94* 96* 97*  CO2 26  --  '27 24 24 23  '$ GLUCOSE 157*  --  138* 179* 168* 182*  BUN 31*  --  26* 30* 32* 37*  CREATININE 1.73*  --  1.54* 1.89* 1.78* 1.87*  CALCIUM 8.7*  --  9.1 9.3 9.3 9.2  MG  --  2.3 2.1 2.4 2.4 2.3  PHOS  --  4.0 4.0 4.4 3.8 3.6    GFR: Estimated Creatinine Clearance: 33.7 mL/min (Rosemaria Inabinet) (by C-G formula based on SCr of 1.87 mg/dL (H)).  Liver Function Tests: Recent Labs  Lab  09/27/20 0352 09/29/20 0228 09/30/20 0511 10/01/20 0355 10/02/20 0327  AST 80* 48* 35 29 24  ALT 41 42 38 31 26  ALKPHOS 68 72 75 79 77  BILITOT 0.4 0.8 0.9 0.7 0.8  PROT 6.4*  6.8 7.8 7.5 7.5  ALBUMIN 2.2* 2.4* 2.8* 2.6* 2.6*    CBG: Recent Labs  Lab 10/01/20 1736 10/01/20 2153 10/02/20 0735 10/02/20 0922 10/02/20 1118  GLUCAP 296* 148* 208* 208* 166*     Recent Results (from the past 240 hour(s))  Resp Panel by RT-PCR (Flu Katrece Roediger&B, Covid) Nasopharyngeal Swab     Status: None   Collection Time: 09/25/20 12:44 PM   Specimen: Nasopharyngeal Swab; Nasopharyngeal(NP) swabs in vial transport medium  Result Value Ref Range Status   SARS Coronavirus 2 by RT PCR NEGATIVE NEGATIVE Final    Comment: (NOTE) SARS-CoV-2 target nucleic acids are NOT DETECTED.  The SARS-CoV-2 RNA is generally detectable in upper respiratory specimens during the acute phase of infection. The lowest concentration of SARS-CoV-2 viral copies this assay can detect is 138 copies/mL. Fatime Biswell negative result does not preclude SARS-Cov-2 infection and should not be used as the sole basis for treatment or other patient management decisions. Lavaeh Bau negative result may occur with  improper specimen collection/handling, submission of specimen other than nasopharyngeal swab, presence of viral mutation(s) within the areas targeted by this assay, and inadequate number of viral copies(<138 copies/mL). Terecia Plaut negative result must be combined with clinical observations, patient history, and epidemiological information. The expected result is Negative.  Fact Sheet for Patients:  EntrepreneurPulse.com.au  Fact Sheet for Healthcare Providers:  IncredibleEmployment.be  This test is no t yet approved or cleared by the [place] FDA and  has been authorized for detection and/or diagnosis of SARS-CoV-2 by FDA under an Emergency Use Authorization (EUA). This EUA will remain  in effect (meaning this  test can be used) for the duration of the COVID-19 declaration under Section 564(b)(1) of the Act, 21 U.S.C.section 360bbb-3(b)(1), unless the authorization is terminated  or revoked sooner.       Influenza Devynn Scheff by PCR NEGATIVE NEGATIVE Final   Influenza B by PCR NEGATIVE NEGATIVE Final    Comment: (NOTE) The Xpert Xpress SARS-CoV-2/FLU/RSV plus assay is intended as an aid in the diagnosis of influenza from Nasopharyngeal swab specimens and should not be used as Judithann Villamar sole basis for treatment. Nasal washings and aspirates are unacceptable for Xpert Xpress SARS-CoV-2/FLU/RSV testing.  Fact Sheet for Patients: EntrepreneurPulse.com.au  Fact Sheet for Healthcare Providers: IncredibleEmployment.be  This test is not yet approved or cleared by the [place] FDA and has been authorized for detection and/or diagnosis of SARS-CoV-2 by FDA under an Emergency Use Authorization (EUA). This EUA will remain in effect (meaning this test can be used) for the duration of the COVID-19 declaration under Section 564(b)(1) of the Act, 21 U.S.C. section 360bbb-3(b)(1), unless the authorization is terminated or revoked.  Performed at [facility], [place] [address], [place], [place] [address]   Culture, blood (routine x 2)     Status: Abnormal   Collection Time: 09/25/20  9:46 PM   Specimen: BLOOD LEFT HAND  Result Value Ref Range Status   Specimen Description BLOOD LEFT HAND  Final   Special Requests AEROBIC BOTTLE ONLY Blood Culture adequate volume  Final   Culture  Setup Time   Final    GRAM NEGATIVE RODS AEROBIC BOTTLE ONLY CRITICAL RESULT CALLED TO, READ BACK BY AND VERIFIED WITH: PHARMD FRANK WILSON 09/27/20'@00'$ :40 BY TW Performed at [facility], [place] [address], [place], [place] [address]    Culture SERRATIA MARCESCENS (Kassidi Elza)  Final   Report Status 09/28/2020 FINAL  Final   Organism ID, Bacteria SERRATIA MARCESCENS  Final  Susceptibility    Serratia marcescens - MIC*    CEFAZOLIN >=64 RESISTANT Resistant     CEFEPIME <=0.12 SENSITIVE Sensitive     CEFTAZIDIME <=1 SENSITIVE Sensitive     CEFTRIAXONE <=0.25 SENSITIVE Sensitive     CIPROFLOXACIN <=0.25 SENSITIVE Sensitive     GENTAMICIN <=1 SENSITIVE Sensitive     TRIMETH/SULFA <=20 SENSITIVE Sensitive     * SERRATIA MARCESCENS  Culture, blood (routine x 2)     Status: None   Collection Time: 09/25/20  9:46 PM   Specimen: BLOOD RIGHT HAND  Result Value Ref Range Status   Specimen Description BLOOD RIGHT HAND  Final   Special Requests AEROBIC BOTTLE ONLY Blood Culture adequate volume  Final   Culture   Final    NO GROWTH 5 DAYS Performed at [facility], [address], [place], [place] [address]    Report Status 09/30/2020 FINAL  Final  Blood Culture ID Panel (Reflexed)     Status: Abnormal   Collection Time: 09/25/20  9:46 PM  Result Value Ref Range Status   Enterococcus faecalis NOT DETECTED NOT DETECTED Final   Enterococcus Faecium NOT DETECTED NOT DETECTED Final   Listeria monocytogenes NOT DETECTED NOT DETECTED Final   Staphylococcus species NOT DETECTED NOT DETECTED Final   Staphylococcus aureus (BCID) NOT DETECTED NOT DETECTED Final   Staphylococcus epidermidis NOT DETECTED NOT DETECTED Final   Staphylococcus lugdunensis NOT DETECTED NOT DETECTED Final   Streptococcus species NOT DETECTED NOT DETECTED Final   Streptococcus agalactiae NOT DETECTED NOT DETECTED Final   Streptococcus pneumoniae NOT DETECTED NOT DETECTED Final   Streptococcus pyogenes NOT DETECTED NOT DETECTED Final   Olson Lucarelli.calcoaceticus-baumannii NOT DETECTED NOT DETECTED Final   Bacteroides fragilis NOT DETECTED NOT DETECTED Final   Enterobacterales DETECTED (Shawnna Pancake) NOT DETECTED Final    Comment: Enterobacterales represent Albino Bufford large order of gram negative bacteria, not Aquarius Latouche single organism. CRITICAL RESULT CALLED TO, READ BACK BY AND VERIFIED WITH: PHARMD FRANK WILSON 09/27/20'@00'$ :40 BY TW     Enterobacter cloacae complex NOT DETECTED NOT DETECTED Final   Escherichia coli NOT DETECTED NOT DETECTED Final   Klebsiella aerogenes NOT DETECTED NOT DETECTED Final   Klebsiella oxytoca NOT DETECTED NOT DETECTED Final   Klebsiella pneumoniae NOT DETECTED NOT DETECTED Final   Proteus species NOT DETECTED NOT DETECTED Final   Salmonella species NOT DETECTED NOT DETECTED Final   Serratia marcescens DETECTED (Shaiann Mcmanamon) NOT DETECTED Final    Comment: CRITICAL RESULT CALLED TO, READ BACK BY AND VERIFIED WITH: PHARMD FRANK WILSON 09/27/20'@00'$ :40 BY TW    Haemophilus influenzae NOT DETECTED NOT DETECTED Final   Neisseria meningitidis NOT DETECTED NOT DETECTED Final   Pseudomonas aeruginosa NOT DETECTED NOT DETECTED Final   Stenotrophomonas maltophilia NOT DETECTED NOT DETECTED Final   Candida albicans NOT DETECTED NOT DETECTED Final   Candida auris NOT DETECTED NOT DETECTED Final   Candida glabrata NOT DETECTED NOT DETECTED Final   Candida krusei NOT DETECTED NOT DETECTED Final   Candida parapsilosis NOT DETECTED NOT DETECTED Final   Candida tropicalis NOT DETECTED NOT DETECTED Final   Cryptococcus neoformans/gattii NOT DETECTED NOT DETECTED Final   CTX-M ESBL NOT DETECTED NOT DETECTED Final   Carbapenem resistance IMP NOT DETECTED NOT DETECTED Final   Carbapenem resistance KPC NOT DETECTED NOT DETECTED Final   Carbapenem resistance NDM NOT DETECTED NOT DETECTED Final   Carbapenem resist OXA 48 LIKE NOT DETECTED NOT DETECTED Final   Carbapenem resistance VIM NOT DETECTED NOT DETECTED Final  Comment: Performed at [facility], [place] [address], [place], [place] [address]  Urine Culture     Status: None   Collection Time: 09/26/20 11:06 AM   Specimen: Urine, Catheterized  Result Value Ref Range Status   Specimen Description URINE, CATHETERIZED  Final   Special Requests NONE  Final   Culture   Final    NO GROWTH Performed at [facility], [address], [place], [place] [address]     Report Status 09/27/2020 FINAL  Final  Culture, blood (routine x 2)     Status: None   Collection Time: 09/27/20  8:17 AM   Specimen: BLOOD  Result Value Ref Range Status   Specimen Description BLOOD LEFT ANTECUBITAL  Final   Special Requests   Final    BOTTLES DRAWN AEROBIC AND ANAEROBIC Blood Culture adequate volume   Culture   Final    NO GROWTH 5 DAYS Performed at [facility], [place] [address] [place], [place] [address]    Report Status 10/02/2020 FINAL  Final  Culture, blood (routine x 2)     Status: None   Collection Time: 09/27/20  8:19 AM   Specimen: BLOOD  Result Value Ref Range Status   Specimen Description BLOOD BLOOD RIGHT HAND  Final   Special Requests   Final    BOTTLES DRAWN AEROBIC AND ANAEROBIC Blood Culture adequate volume   Culture   Final    NO GROWTH 5 DAYS Performed at [facility], [place] [address], [place], [place] [address]    Report Status 10/02/2020 FINAL  Final         Radiology Studies: No results found.      Scheduled Meds:  buPROPion  150 mg Oral q morning   Chlorhexidine Gluconate Cloth  6 each Topical Daily   clopidogrel  75 mg Oral Daily   docusate sodium  100 mg Oral BID   DULoxetine  60 mg Oral Daily   feeding supplement (GLUCERNA SHAKE)  237 mL Oral TID BM   influenza vaccine adjuvanted  0.5 mL Intramuscular Tomorrow-1000   insulin aspart  0-20 Units Subcutaneous TID WC   insulin detemir  20 Units Subcutaneous Q2200   isosorbide mononitrate  30 mg Oral Daily   ketorolac  1 drop Right Eye Daily   magnesium oxide  400 mg Oral BID   mouth rinse  15 mL Mouth Rinse BID   melatonin  3 mg Oral QHS   metoprolol succinate  25 mg Oral Daily   multivitamin with minerals  1 tablet Oral Daily   nystatin  5 mL Oral QID   omega-3 acid ethyl esters  1 g Oral Daily   pantoprazole  40 mg Oral QHS   polyethylene glycol  17 g Oral Daily   rosuvastatin  20 mg Oral Q2200   senna  1 tablet Oral BID   tamsulosin  0.4 mg Oral Daily    vitamin B-12  1,000 mcg Oral Daily   Continuous Infusions:     LOS: 14 days    Time spent: over 30 min    Fayrene Helper, MD [facility]   To contact the attending provider between 7A-7P or the covering provider during after hours 7P-7A, please log into the web site [email or web address] and access using universal [place] password for that web site. If you do not have the password, please call the hospital operator.  10/02/2020, 2:51 PM

## 2020-10-02 NOTE — Progress Notes (Signed)
Pt refused oral meds this morning, and spit out his nystatin. He is less alert this morning, but was told he did not sleep as much as the night before. Pt son in room, will attempt meds at another time. Provider updated.

## 2020-10-02 NOTE — Consult Note (Addendum)
[facility]    Date of Admission:  09/18/2020     Reason for Consult: serratia bacteremia, recent back surgery/instrumentation    Referring Provider: Florene Glen    Lines:  Peripheral iv's  Abx: 9/7-c cefepime  9/7-12 vanc        Assessment: Serratia bacteremia S/p on 8/30 L2/3 laminectomy with anterior interbody device and posterior L2-L5 instrumentation; removal previous rod L4-5 Clinical herpes and candida stomatitis Genital diaper rash with candidiasis Back heat rash  85 yo male dm2, ckd3, htn, osa, prior lumbar instrumentation, admitted 8/30 for semielective lumbar decompression surgery with new instrumentation/removal old L4-5 rod, course complicated by nosocomial serratia bacteremia unclear source, clinical hsv stomatitis, metabolic encephalopathy, and a rash  Oral lesions appear to look like hsv stomatitis vs candida He has genital rash (wearing depend-diapers) which appear to be moisture related with also feature of candida Body rash only in the back appears to be heat rash not hsv and not bacterial folliculitis. Ddx yeast folliculitis Mentation more consistent with delirium but clinically doesn't appear to be hsv encephalitis.   Serratia bacteremia 9/6 concerning for surgical site occult involvement. Would treat longer. Plan 6 weeks iv/oral. For now would keep iv and transition to oral cipro outpatient  Plan: Discussed with primary team. Can defer brain mri for now as no strong need Restart serratia bsi tx with ertapenem (cefepime could have caused some encephalopathy) Plan 6 weeks abx but potentiallly once metnation improves here and eating well can transition to oral cipro Will need id outpatient follow up Start valacyclovir 1 gram po bid for oral stomatitis for 2 weeks; would also give him 2 week oral fluconazole (he has both ulcer s/o hsv, and also white plaque and angular cheilitis suggestive of candida stomatitis)  Avoid  excessive heat/moisture in back for the heat rash For diaper rash avoid diaper; expose to circulating air; prn use of nystatin powder.    I spent 60 minute reviewing data/chart, and coordinating care and >50% direct face to face time providing counseling/discussing diagnostics/treatment plan with patient      ------------------------------------------------ Active Problems:   Lumbar spinal stenosis   Sleep apnea   Diabetes mellitus without complication (HCC)   Coronary artery disease   CKD (chronic kidney disease)   SIRS (systemic inflammatory response syndrome) (HCC)   Hypoxia   Pleural effusion   Delirium    HPI: Ricky Nelson is a 85 y.o. male pmh dm2, ckd3, htn, osa, prior lumbar instrumentation, admitted 8/30 for semielective lumbar decompression surgery with new instrumentation/removal old L4-5 rod, course complicated by nosocomial serratia bacteremia unclear source, clinical hsv stomatitis, metabolic encephalopathy, and a rash   History via chart, discussion with primary team, nursing, and his wife Patient is still confused now and not able to give much history  Patient has initially relatively uncomplicated postop course but developed sepsis around HD #7 with serratia bsi. There was no central line. Source unclear. His sepsis defervesced quickly with cefepime  His primary hospitalist team reports that patient has had waxing/waning mentation the last few days. This is confirmed by wife and nursing staff. However, it appears to be better today.  His mentation change is in setting of cefepime iv abx, new rash (painful) in mouth that appears worse. Nursing staff also reports patient with discomfort and red rash along genital area. He has been wearing depend due to incontinence  The last couple days also has itchy rash on back  His surgical site according to nsg evaluation doesn't appear infected   Denies  headache, visual change, focal numbness/tingling/weakness, joint  pain, muscle pain, cough, sob, n/v/diarrhea   Fam hx: No rheumatologic process  Social History   Tobacco Use   Smoking status: Never   Smokeless tobacco: Never  Vaping Use   Vaping Use: Never used  Substance Use Topics   Alcohol use: Not Currently   Drug use: Never    Allergies  Allergen Reactions   Allergy Relief [Chlorpheniramine Maleate] Shortness Of Breath   Diazepam     Other reaction(s): Hypotension, Low blood pressure, Other (See Comments)   Diphenhydramine Hcl Shortness Of Breath    Other reaction(s): Other (See Comments), Stopped breathing Other reaction(s): Shortness Of Breath Shortness of breath with benadryl    Diphenhydramine-Zinc Acetate Shortness Of Breath   Atorvastatin Other (See Comments)    Other reaction(s): Muscle pain, Other (See Comments), Unknown Elevated CK      Fluvastatin Other (See Comments)    Other reaction(s): Other (See Comments) myalgias   Lisinopril     Other reaction(s): Hyperkalemia, Other (See Comments)   Metformin And Related     Other reaction(s): Other Renal function   Piperacillin-Tazobactam In Dex Rash    Other reaction(s): Rash Other reaction(s): Rash    Diphenhydramine     Other reaction(s): Low blood pressure   Other Other (See Comments)    Lescol    Review of Systems: ROS All Other ROS was negative, except mentioned above   Past Medical History:  Diagnosis Date   Anemia    Anginal pain ([facility])    2018; chronic stable angina 06/2020 (Dr. Atilano Median)   Cancer [facility])    bladder   CKD (chronic kidney disease)    Coronary artery disease    Diabetes mellitus without complication ([facility])    type 2   Hypertension    Myocardial infarction ([place]) 1995   Pneumonia 1995   Sleep apnea 2012       Scheduled Meds:  buPROPion  150 mg Oral q morning   Chlorhexidine Gluconate Cloth  6 each Topical Daily   clopidogrel  75 mg Oral Daily   docusate sodium  100 mg Oral BID   DULoxetine  60 mg Oral Daily   feeding  supplement (GLUCERNA SHAKE)  237 mL Oral TID BM   influenza vaccine adjuvanted  0.5 mL Intramuscular Tomorrow-1000   insulin aspart  0-20 Units Subcutaneous TID WC   insulin detemir  20 Units Subcutaneous Q2200   isosorbide mononitrate  30 mg Oral Daily   ketorolac  1 drop Right Eye Daily   magnesium oxide  400 mg Oral BID   mouth rinse  15 mL Mouth Rinse BID   melatonin  3 mg Oral QHS   metoprolol succinate  25 mg Oral Daily   multivitamin with minerals  1 tablet Oral Daily   nystatin  5 mL Oral QID   omega-3 acid ethyl esters  1 g Oral Daily   pantoprazole  40 mg Oral QHS   polyethylene glycol  17 g Oral Daily   rosuvastatin  20 mg Oral Q2200   senna  1 tablet Oral BID   tamsulosin  0.4 mg Oral Daily   vitamin B-12  1,000 mcg Oral Daily   Continuous Infusions: PRN Meds:.acetaminophen **OR** acetaminophen, bisacodyl, fluticasone, ipratropium-albuterol, lidocaine, LORazepam, menthol-cetylpyridinium **OR** phenol, nitroGLYCERIN, ondansetron **OR** ondansetron (ZOFRAN) IV, senna-docusate, triamcinolone cream   OBJECTIVE: Blood pressure (!) 154/74, pulse 100, temperature 98.7  F (37.1 C), temperature source Axillary, resp. rate 18, height '5\' 9"'$  (1.753 m), weight 93 kg, SpO2 98 %.  Physical Exam  General/constitutional: no distress, pleasant; verbal/conversant but without sustained thought process; cooperative with simple commands HEENT: Normocephalic, PER, Conj Clear, EOMI; see picture today for tongue/lips rash (ulcers base and surface of tongue tender -- white plaque on tongue as well -- along with chapped/cracked erythematous angles of mouth) Neck supple CV: rrr no mrg Lungs: clear to auscultation, normal respiratory effort Abd: Soft, Nontender Ext: no edema Skin: back with diffuse nontender erythematous macule/papule; along groin/scrotal/penile shaft are raw erythematous tender skin without any circular ulceration or pustule Neuro: nonfocal, although per wife more confused than  baseline MSK: no peripheral joint swelling/tenderness/warmth; back spines nontender -- back incision intact no fluctuance/tenderness/erythema Psych alert/oriented to self/wife   Central line presence: no   Lab Results Lab Results  Component Value Date   WBC 10.7 (H) 10/02/2020   HGB 9.9 (L) 10/02/2020   HCT 31.5 (L) 10/02/2020   MCV 94.9 10/02/2020   PLT 251 10/02/2020    Lab Results  Component Value Date   CREATININE 1.87 (H) 10/02/2020   BUN 37 (H) 10/02/2020   NA 133 (L) 10/02/2020   K 4.5 10/02/2020   CL 97 (L) 10/02/2020   CO2 23 10/02/2020    Lab Results  Component Value Date   ALT 26 10/02/2020   AST 24 10/02/2020   ALKPHOS 77 10/02/2020   BILITOT 0.8 10/02/2020      Microbiology: Recent Results (from the past 240 hour(s))  Resp Panel by RT-PCR (Flu A&B, Covid) Nasopharyngeal Swab     Status: None   Collection Time: 09/25/20 12:44 PM   Specimen: Nasopharyngeal Swab; Nasopharyngeal(NP) swabs in vial transport medium  Result Value Ref Range Status   SARS Coronavirus 2 by RT PCR NEGATIVE NEGATIVE Final    Comment: (NOTE) SARS-CoV-2 target nucleic acids are NOT DETECTED.  The SARS-CoV-2 RNA is generally detectable in upper respiratory specimens during the acute phase of infection. The lowest concentration of SARS-CoV-2 viral copies this assay can detect is 138 copies/mL. A negative result does not preclude SARS-Cov-2 infection and should not be used as the sole basis for treatment or other patient management decisions. A negative result may occur with  improper specimen collection/handling, submission of specimen other than nasopharyngeal swab, presence of viral mutation(s) within the areas targeted by this assay, and inadequate number of viral copies(<138 copies/mL). A negative result must be combined with clinical observations, patient history, and epidemiological information. The expected result is Negative.  Fact Sheet for Patients:   EntrepreneurPulse.com.au  Fact Sheet for Healthcare Providers:  IncredibleEmployment.be  This test is no t yet approved or cleared by the [place] FDA and  has been authorized for detection and/or diagnosis of SARS-CoV-2 by FDA under an Emergency Use Authorization (EUA). This EUA will remain  in effect (meaning this test can be used) for the duration of the COVID-19 declaration under Section 564(b)(1) of the Act, 21 U.S.C.section 360bbb-3(b)(1), unless the authorization is terminated  or revoked sooner.       Influenza A by PCR NEGATIVE NEGATIVE Final   Influenza B by PCR NEGATIVE NEGATIVE Final    Comment: (NOTE) The Xpert Xpress SARS-CoV-2/FLU/RSV plus assay is intended as an aid in the diagnosis of influenza from Nasopharyngeal swab specimens and should not be used as a sole basis for treatment. Nasal washings and aspirates are unacceptable for Xpert Xpress SARS-CoV-2/FLU/RSV testing.  Fact  Sheet for Patients: EntrepreneurPulse.com.au  Fact Sheet for Healthcare Providers: IncredibleEmployment.be  This test is not yet approved or cleared by the [place] FDA and has been authorized for detection and/or diagnosis of SARS-CoV-2 by FDA under an Emergency Use Authorization (EUA). This EUA will remain in effect (meaning this test can be used) for the duration of the COVID-19 declaration under Section 564(b)(1) of the Act, 21 U.S.C. section 360bbb-3(b)(1), unless the authorization is terminated or revoked.  Performed at [facility], [place] [address], [place], [place] [address]   Culture, blood (routine x 2)     Status: Abnormal   Collection Time: 09/25/20  9:46 PM   Specimen: BLOOD LEFT HAND  Result Value Ref Range Status   Specimen Description BLOOD LEFT HAND  Final   Special Requests AEROBIC BOTTLE ONLY Blood Culture adequate volume  Final   Culture  Setup Time   Final    GRAM NEGATIVE  RODS AEROBIC BOTTLE ONLY CRITICAL RESULT CALLED TO, READ BACK BY AND VERIFIED WITH: PHARMD FRANK WILSON 09/27/20'@00'$ :40 BY TW Performed at [facility], [place] [address] [place], [place] [address]    Culture SERRATIA MARCESCENS (A)  Final   Report Status 09/28/2020 FINAL  Final   Organism ID, Bacteria SERRATIA MARCESCENS  Final      Susceptibility   Serratia marcescens - MIC*    CEFAZOLIN >=64 RESISTANT Resistant     CEFEPIME <=0.12 SENSITIVE Sensitive     CEFTAZIDIME <=1 SENSITIVE Sensitive     CEFTRIAXONE <=0.25 SENSITIVE Sensitive     CIPROFLOXACIN <=0.25 SENSITIVE Sensitive     GENTAMICIN <=1 SENSITIVE Sensitive     TRIMETH/SULFA <=20 SENSITIVE Sensitive     * SERRATIA MARCESCENS  Culture, blood (routine x 2)     Status: None   Collection Time: 09/25/20  9:46 PM   Specimen: BLOOD RIGHT HAND  Result Value Ref Range Status   Specimen Description BLOOD RIGHT HAND  Final   Special Requests AEROBIC BOTTLE ONLY Blood Culture adequate volume  Final   Culture   Final    NO GROWTH 5 DAYS Performed at [facility], [place] [address], [place],  [address]    Report Status 09/30/2020 FINAL  Final  Blood Culture ID Panel (Reflexed)     Status: Abnormal   Collection Time: 09/25/20  9:46 PM  Result Value Ref Range Status   Enterococcus faecalis NOT DETECTED NOT DETECTED Final   Enterococcus Faecium NOT DETECTED NOT DETECTED Final   Listeria monocytogenes NOT DETECTED NOT DETECTED Final   Staphylococcus species NOT DETECTED NOT DETECTED Final   Staphylococcus aureus (BCID) NOT DETECTED NOT DETECTED Final   Staphylococcus epidermidis NOT DETECTED NOT DETECTED Final   Staphylococcus lugdunensis NOT DETECTED NOT DETECTED Final   Streptococcus species NOT DETECTED NOT DETECTED Final   Streptococcus agalactiae NOT DETECTED NOT DETECTED Final   Streptococcus pneumoniae NOT DETECTED NOT DETECTED Final   Streptococcus pyogenes NOT DETECTED NOT DETECTED Final    A.calcoaceticus-baumannii NOT DETECTED NOT DETECTED Final   Bacteroides fragilis NOT DETECTED NOT DETECTED Final   Enterobacterales DETECTED (A) NOT DETECTED Final    Comment: Enterobacterales represent a large order of gram negative bacteria, not a single organism. CRITICAL RESULT CALLED TO, READ BACK BY AND VERIFIED WITH: PHARMD FRANK WILSON 09/27/20'@00'$ :40 BY TW    Enterobacter cloacae complex NOT DETECTED NOT DETECTED Final   Escherichia coli NOT DETECTED NOT DETECTED Final   Klebsiella aerogenes NOT DETECTED NOT DETECTED Final  Klebsiella oxytoca NOT DETECTED NOT DETECTED Final   Klebsiella pneumoniae NOT DETECTED NOT DETECTED Final   Proteus species NOT DETECTED NOT DETECTED Final   Salmonella species NOT DETECTED NOT DETECTED Final   Serratia marcescens DETECTED (A) NOT DETECTED Final    Comment: CRITICAL RESULT CALLED TO, READ BACK BY AND VERIFIED WITH: PHARMD FRANK WILSON 09/27/20'@00'$ :40 BY TW    Haemophilus influenzae NOT DETECTED NOT DETECTED Final   Neisseria meningitidis NOT DETECTED NOT DETECTED Final   Pseudomonas aeruginosa NOT DETECTED NOT DETECTED Final   Stenotrophomonas maltophilia NOT DETECTED NOT DETECTED Final   Candida albicans NOT DETECTED NOT DETECTED Final   Candida auris NOT DETECTED NOT DETECTED Final   Candida glabrata NOT DETECTED NOT DETECTED Final   Candida krusei NOT DETECTED NOT DETECTED Final   Candida parapsilosis NOT DETECTED NOT DETECTED Final   Candida tropicalis NOT DETECTED NOT DETECTED Final   Cryptococcus neoformans/gattii NOT DETECTED NOT DETECTED Final   CTX-M ESBL NOT DETECTED NOT DETECTED Final   Carbapenem resistance IMP NOT DETECTED NOT DETECTED Final   Carbapenem resistance KPC NOT DETECTED NOT DETECTED Final   Carbapenem resistance NDM NOT DETECTED NOT DETECTED Final   Carbapenem resist OXA 48 LIKE NOT DETECTED NOT DETECTED Final   Carbapenem resistance VIM NOT DETECTED NOT DETECTED Final    Comment: Performed at [facility]  Lab, [address] [place], [place] [address]  Urine Culture     Status: None   Collection Time: 09/26/20 11:06 AM   Specimen: Urine, Catheterized  Result Value Ref Range Status   Specimen Description URINE, CATHETERIZED  Final   Special Requests NONE  Final   Culture   Final    NO GROWTH Performed at [facility], [address], [place], [place] [address]    Report Status 09/27/2020 FINAL  Final  Culture, blood (routine x 2)     Status: None   Collection Time: 09/27/20  8:17 AM   Specimen: BLOOD  Result Value Ref Range Status   Specimen Description BLOOD LEFT ANTECUBITAL  Final   Special Requests   Final    BOTTLES DRAWN AEROBIC AND ANAEROBIC Blood Culture adequate volume   Culture   Final    NO GROWTH 5 DAYS Performed at [facility], [place] [address], [place],  [address]    Report Status 10/02/2020 FINAL  Final  Culture, blood (routine x 2)     Status: None   Collection Time: 09/27/20  8:19 AM   Specimen: BLOOD  Result Value Ref Range Status   Specimen Description BLOOD BLOOD RIGHT HAND  Final   Special Requests   Final    BOTTLES DRAWN AEROBIC AND ANAEROBIC Blood Culture adequate volume   Culture   Final    NO GROWTH 5 DAYS Performed at [facility], [place] [address] [place],  [address]    Report Status 10/02/2020 FINAL  Final     Serology:    Imaging: If present, new imagings (plain films, ct scans, and mri) have been personally visualized and interpreted; radiology reports have been reviewed. Decision making incorporated into the Impression / Recommendations.  9/11 cxr  No acute cardiopulm process  9/6 ct chest angio No visible pulmonary embolus. Pulmonary arteries obscured in the lung bases due to respiratory motion.   Small right pleural effusion.  Bibasilar atelectasis.  Jabier Mutton, [facility] [place] [phone] pager    10/02/2020, 3:20  PM

## 2020-10-02 NOTE — Progress Notes (Signed)
Restraints removed this morning when son arrived. Patient was able to verbalize that he needed to have a BM. With 2 assist pt put on bedside commode. Pt. Legs buckled x3, requiring a three assist to recliner. Pt tolerated sitting in recliner for 2 hours before returning to bed.   1550 wife tried to feed patient.Telesitter called RN reporting a safety check. Patient repeatedly attempted to exit the bed saying he wanted to go to a restaurant to eat something better. Staff to room to redirect patient. Patient became irritable and non-redirectable. Restraints reapplied.

## 2020-10-03 DIAGNOSIS — L22 Diaper dermatitis: Secondary | ICD-10-CM | POA: Diagnosis not present

## 2020-10-03 DIAGNOSIS — B002 Herpesviral gingivostomatitis and pharyngotonsillitis: Secondary | ICD-10-CM | POA: Diagnosis not present

## 2020-10-03 DIAGNOSIS — R410 Disorientation, unspecified: Secondary | ICD-10-CM | POA: Diagnosis not present

## 2020-10-03 DIAGNOSIS — Z419 Encounter for procedure for purposes other than remedying health state, unspecified: Secondary | ICD-10-CM | POA: Diagnosis not present

## 2020-10-03 DIAGNOSIS — B370 Candidal stomatitis: Secondary | ICD-10-CM | POA: Diagnosis not present

## 2020-10-03 LAB — BASIC METABOLIC PANEL
Anion gap: 10 (ref 5–15)
BUN: 34 mg/dL — ABNORMAL HIGH (ref 8–23)
CO2: 27 mmol/L (ref 22–32)
Calcium: 9.4 mg/dL (ref 8.9–10.3)
Chloride: 97 mmol/L — ABNORMAL LOW (ref 98–111)
Creatinine, Ser: 1.98 mg/dL — ABNORMAL HIGH (ref 0.61–1.24)
GFR, Estimated: 33 mL/min — ABNORMAL LOW (ref >60)
Glucose, Bld: 192 mg/dL — ABNORMAL HIGH (ref 70–99)
Potassium: 4.4 mmol/L (ref 3.5–5.1)
Sodium: 134 mmol/L — ABNORMAL LOW (ref 135–145)

## 2020-10-03 LAB — GLUCOSE, CAPILLARY
Glucose-Capillary: 191 mg/dL — ABNORMAL HIGH (ref 70–99)
Glucose-Capillary: 153 mg/dL — ABNORMAL HIGH (ref 70–99)
Glucose-Capillary: 138 mg/dL — ABNORMAL HIGH (ref 70–99)
Glucose-Capillary: 174 mg/dL — ABNORMAL HIGH (ref 70–99)

## 2020-10-03 NOTE — Progress Notes (Signed)
Consult NOTE    Ricky Nelson  [id number] DOB: 08/28/1935 DOA: 09/18/2020 PCP: Pcp, No   No chief complaint on file.  Brief Narrative:  Ricky Nelson is a 85 y.o. male with medical history significant for CAD, T2DM, CKD stage IIIb, HTN, OSA on CPAP who was admitted under the neurosurgery service after undergoing L2-L4 decompression/fusion on 09/18/2020.  Patient had new fever developing 9/6  with transient episodes of tachycardia and hypotension.  No clear infectious source.  Surgical site appears clean.  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  Blood cultures and urine cultures were ordered but still pending collection.  He had serratia bacteremia.  ID saw and initially recommended 7 days of abx.   Course complicated by delirium which has been persistent.    He's now got oral lesions and pain with swallowing concerning for HSV.   ID reconsulted.    Assessment & Plan:   Active Problems:   Lumbar spinal stenosis   Sleep apnea   Diabetes mellitus without complication (HCC)   Coronary artery disease   CKD (chronic kidney disease)   SIRS (systemic inflammatory response syndrome) (HCC)   Hypoxia   Pleural effusion   Delirium   Serratia septicemia (HCC)   Herpes stomatitis   Candidal stomatitis   Diaper rash   Heat rash   S/P lumbar spine operation   Fixation hardware in spine  Oral Herpes Simplex  Oral Ulcers Stomatitis likely  On valtrex 1 g bid--Also started on Fluconazole additionally Lidocaine, nystatin Poor PO intake related to this, follow closely  Maculopapular Rash with Crusting Back with new rash, worsened in past day or so Raised, has some crusting  Contact dermatitis given presence primarily on back Appreciate ID  Acute encephalopathy  Concern for neurocognitive deficit: Persistent today Presumed 2/2 serratia bacteremia, surgery, acute hospitalization, etc ammonia (wnl).  Follow B12, folate, TSH (mildly elevated -> repeat).  VBG without hypercarbia. Minimize sedating  medications, hold narcotics, Robaxin, Ativan also held 9/14 Hold gabapentin for now. delirium precautions - will allow family to stay at bedside overnight - notably wife notes symptoms concerning for sundowning occasionally at home - ? Dementia, not diagnosed Follow EKG for QT eval - consider seroquel (plan for trazodone right now)  qt prolonged, hold qt prolonging agens -> trial melatonin Delirium has been prolonged, concerning -> discussed potential to have to discuss goals of care/involving palliative if he's not improving minimize use of restraints - utilize sitter, telesitter, family when possible   Sepsis 2/2 Serratia Marcescens Bacteremia: Fever with T-max 103.7 F rectally on 9/6. Also with reported rigors. associated tachycardia, tachypnea, transient hypotension Now with 1/2 sets blood cultures showing serratia Repeat blood cultures from 9/8 pending  Source not clear -> urinary vs pulmonary  Urine cx no growth (after abx) - UA with rare bacteria, 11-20 WBC's (collected late) CXR notable for mild interstitial edema and R pleural effusion COVID and influenza negative.   Cefepime changed to Ertapenem [cefepime could cause encephalopathy]--anticipating prolonged duration Rx ID c/s, appreciate recommendations - recommending 7 days abx (minimal concern for wound infection per discussion with NSGY, d/c vanc)  Acute Hypoxic Respiratory Failure  History of COPD  Mild Pulmonary Edema   Heart Failure Exacerbation - could be related to COPD (though not chronically on O2), small R effusion on CT scan could represent pna - pneumonia +/- HF exacerbation - CT without visible PE, small R effusion - CXR 9/11 without acute CP disease - currently on RA - follow echo -> EF 50-55%, inferolateral  hypokinesis, grade II diastolic dysfunction - moderately elevated PASP, mildly reduced RVSF - strict I/O, daily weights (he's net negative, weights downtrending) - hold additional IVF for now - s/p 1 dose  lasix on 9/10 (he's autodiuresed with net negative 13.8 L) - wean as tolerated, w/u further as indicated - SLP eval   Hyponatremia: With a normal serum osm, will continue to monitor Urine sodium 83    HFpEF  Inferolateral Hypokinesis Diuresis as noted above Continue metoprolol Consider cardiology for wall motion abnormality - unclear chronicity, no prior echos  ?Presyncope C/o vision going black and tremors/jerking when standing with therapy Needs additional f/u, monitoring BP's look ok, will continue to monitor   S/p L2-L4 decompression/fusion 09/18/2020: Per neurosurgery. Wound doesn't appear infected per Dr. Kathyrn Sheriff   CKD stage IIIb: Creatinine 2.46 at presentation Baseline unclear, as low as 1.58 here Fluctuating, continue to monitor Renal US (without evidence hydro - ? Bladder diverticulum), UA (pending) net negative here  Type 2 diabetes: Hold home glipizide, Jardiance, Victoza and reduce Levemir to 20 U CBG's 150-200  Adjust insulin as needed, continue SSI    CAD: Continue Toprol-XL and Imdur as BP allows.   Iron deficiency anemia: S/p 2 unit PRBC for acute blood loss anemia (9/2 and 9/3)   Hypertension: Continue Toprol-XL and Imdur as BP allows.   OSA: Continue CPAP nightly.  Bladder Diverticulum Needs follow up with dedicated CT or [place] as allowed  DVT prophylaxis: SCD Code Status: full  Family Communication: son at bedside Disposition:   Per [place]       Consultants:  [facility]   Procedures: PROCEDURE: 1. L2, L3 laminectomy with facetectomy for decompression of exiting nerve roots, more than would be required for placement of interbody graft 2. Placement of anterior interbody device - Medtronic epandable 75m cage x4 3. Posterior segmental instrumentation using Medtronic Solera pedicle screws and previously place Stryker D90 screws, L2 - L5 4. Interbody arthrodesis, L2-3, L3-4 5. Use of locally harvested bone autograft 6. Use of  non-structural bone allograft - ProteOs 7. Removal of previous rod, L4-5  Antimicrobials: Anti-infectives (From admission, onward)    Start     Dose/Rate Route Frequency Ordered Stop   10/03/20 0200  ceFEPIme (MAXIPIME) 2 g in sodium chloride 0.9 % 100 mL IVPB  Status:  Discontinued        2 g 200 mL/hr over 30 Minutes Intravenous Every 12 hours 10/02/20 1526 10/02/20 1534   10/03/20 0200  ertapenem (INVANZ) 1,000 mg in sodium chloride 0.9 % 100 mL IVPB        1 g 200 mL/hr over 30 Minutes Intravenous Every 24 hours 10/02/20 1534     10/02/20 1700  fluconazole (DIFLUCAN) tablet 200 mg        200 mg Oral Daily 10/02/20 1537     10/02/20 1630  valACYclovir (VALTREX) tablet 1,000 mg        1,000 mg Oral 2 times daily 10/02/20 1537     10/02/20 1600  acyclovir (ZOVIRAX) 930 mg in dextrose 5 % 150 mL IVPB  Status:  Discontinued        10 mg/kg  93 kg 168.6 mL/hr over 60 Minutes Intravenous Every 12 hours 10/02/20 1529 10/02/20 1537   10/01/20 2200  valACYclovir (VALTREX) tablet 500 mg  Status:  Discontinued        500 mg Oral 2 times daily 10/01/20 1558 10/01/20 1601   10/01/20 1000  vancomycin (VANCOREADY) IVPB 1250 mg/250 mL  Status:  Discontinued        1,250 mg 166.7 mL/hr over 90 Minutes Intravenous Every 48 hours 09/30/20 1223 10/01/20 1237   09/30/20 1300  vancomycin (VANCOREADY) IVPB 750 mg/150 mL  Status:  Discontinued        750 mg 150 mL/hr over 60 Minutes Intravenous Every 24 hours 09/29/20 1144 09/30/20 1221   09/29/20 1230  vancomycin (VANCOREADY) IVPB 2000 mg/400 mL        2,000 mg 200 mL/hr over 120 Minutes Intravenous  Once 09/29/20 1144 09/29/20 1456   09/28/20 0200  vancomycin (VANCOREADY) IVPB 1500 mg/300 mL  Status:  Discontinued        1,500 mg 150 mL/hr over 120 Minutes Intravenous Every 48 hours 09/26/20 0035 09/27/20 1106   09/26/20 1400  ceFEPIme (MAXIPIME) 2 g in sodium chloride 0.9 % 100 mL IVPB        2 g 200 mL/hr over 30 Minutes Intravenous Every 12  hours 09/26/20 0035 10/02/20 1700   09/26/20 0130  vancomycin (VANCOREADY) IVPB 2000 mg/400 mL        2,000 mg 200 mL/hr over 120 Minutes Intravenous STAT 09/26/20 0035 09/26/20 0854   09/26/20 0100  ceFEPIme (MAXIPIME) 2 g in sodium chloride 0.9 % 100 mL IVPB        2 g 200 mL/hr over 30 Minutes Intravenous STAT 09/26/20 0035 09/26/20 0852   09/18/20 2300  ceFAZolin (ANCEF) IVPB 2g/100 mL premix        2 g 200 mL/hr over 30 Minutes Intravenous Every 8 hours 09/18/20 1852 09/19/20 0633   09/18/20 0930  ceFAZolin (ANCEF) IVPB 2g/100 mL premix        2 g 200 mL/hr over 30 Minutes Intravenous On call to O.R. 09/18/20 0925 09/18/20 1520          Subjective:  Called about confusions today and sleepiness- He is awakening but not oriented He hasnt been on his CPAP since 9/7 He has no cp/fever chills but his ROS is unreliable  Objective: Vitals:   10/02/20 2322 10/03/20 0304 10/03/20 0450 10/03/20 0807  BP: 116/78 (!) 146/90  (!) 155/86  Pulse:    99  Resp: '14 16  13  '$ Temp: 99.6 F (37.6 C) 99.5 F (37.5 C)  98.1 F (36.7 C)  TempSrc: Oral Axillary  Oral  SpO2:    97%  Weight:   93.3 kg   Height:        Intake/Output Summary (Last 24 hours) at 10/03/2020 1116 Last data filed at 10/03/2020 0500 Gross per 24 hour  Intake 100 ml  Output --  Net 100 ml     Filed Weights   10/01/20 0437 10/02/20 0500 10/03/20 0450  Weight: 93.3 kg 93 kg 93.3 kg    Examination:  Eomi ncat confused elderly WM Cta b anteriorly Abd sft nt dn no rebound In restraints No le edema    Data Reviewed: I have personally reviewed following labs and imaging studies  CBC: Recent Labs  Lab 09/27/20 0352 09/28/20 0151 09/29/20 0228 09/30/20 0511 10/01/20 0355 10/02/20 0327  WBC 6.5 7.4 7.3 10.4 9.1 10.7*  NEUTROABS 5.0  --  5.5 8.0* 7.1 8.2*  HGB 8.0* 8.4* 8.6* 9.3* 10.5* 9.9*  HCT 25.1* 26.2* 27.3* 29.5* 33.4* 31.5*  MCV 95.1 94.9 94.5 93.9 95.4 94.9  PLT 171 174 181 226 228 251      Basic Metabolic Panel: Recent Labs  Lab 09/28/20 1209 09/29/20 0228 09/30/20 0511 10/01/20 0355 10/02/20 0327  10/03/20 0054  NA  --  132* 132* 133* 133* 134*  K  --  4.6 4.4 4.4 4.5 4.4  CL  --  94* 94* 96* 97* 97*  CO2  --  '27 24 24 23 27  '$ GLUCOSE  --  138* 179* 168* 182* 192*  BUN  --  26* 30* 32* 37* 34*  CREATININE  --  1.54* 1.89* 1.78* 1.87* 1.98*  CALCIUM  --  9.1 9.3 9.3 9.2 9.4  MG 2.3 2.1 2.4 2.4 2.3  --   PHOS 4.0 4.0 4.4 3.8 3.6  --      GFR: Estimated Creatinine Clearance: 31.9 mL/min (A) (by C-G formula based on SCr of 1.98 mg/dL (H)).  Liver Function Tests: Recent Labs  Lab 09/27/20 0352 09/29/20 0228 09/30/20 0511 10/01/20 0355 10/02/20 0327  AST 80* 48* 35 29 24  ALT 41 42 38 31 26  ALKPHOS 68 72 75 79 77  BILITOT 0.4 0.8 0.9 0.7 0.8  PROT 6.4* 6.8 7.8 7.5 7.5  ALBUMIN 2.2* 2.4* 2.8* 2.6* 2.6*     CBG: Recent Labs  Lab 10/02/20 1118 10/02/20 1540 10/02/20 1722 10/02/20 2051 10/03/20 0804  GLUCAP 166* 141* 183* 158* 191*      Recent Results (from the past 240 hour(s))  Resp Panel by RT-PCR (Flu A&B, Covid) Nasopharyngeal Swab     Status: None   Collection Time: 09/25/20 12:44 PM   Specimen: Nasopharyngeal Swab; Nasopharyngeal(NP) swabs in vial transport medium  Result Value Ref Range Status   SARS Coronavirus 2 by RT PCR NEGATIVE NEGATIVE Final    Comment: (NOTE) SARS-CoV-2 target nucleic acids are NOT DETECTED.  The SARS-CoV-2 RNA is generally detectable in upper respiratory specimens during the acute phase of infection. The lowest concentration of SARS-CoV-2 viral copies this assay can detect is 138 copies/mL. A negative result does not preclude SARS-Cov-2 infection and should not be used as the sole basis for treatment or other patient management decisions. A negative result may occur with  improper specimen collection/handling, submission of specimen other than nasopharyngeal swab, presence of viral mutation(s) within  the areas targeted by this assay, and inadequate number of viral copies(<138 copies/mL). A negative result must be combined with clinical observations, patient history, and epidemiological information. The expected result is Negative.  Fact Sheet for Patients:  EntrepreneurPulse.com.au  Fact Sheet for Healthcare Providers:  IncredibleEmployment.be  This test is no t yet approved or cleared by the [place] FDA and  has been authorized for detection and/or diagnosis of SARS-CoV-2 by FDA under an Emergency Use Authorization (EUA). This EUA will remain  in effect (meaning this test can be used) for the duration of the COVID-19 declaration under Section 564(b)(1) of the Act, 21 U.S.C.section 360bbb-3(b)(1), unless the authorization is terminated  or revoked sooner.       Influenza A by PCR NEGATIVE NEGATIVE Final   Influenza B by PCR NEGATIVE NEGATIVE Final    Comment: (NOTE) The Xpert Xpress SARS-CoV-2/FLU/RSV plus assay is intended as an aid in the diagnosis of influenza from Nasopharyngeal swab specimens and should not be used as a sole basis for treatment. Nasal washings and aspirates are unacceptable for Xpert Xpress SARS-CoV-2/FLU/RSV testing.  Fact Sheet for Patients: EntrepreneurPulse.com.au  Fact Sheet for Healthcare Providers: IncredibleEmployment.be  This test is not yet approved or cleared by the [place] FDA and has been authorized for detection and/or diagnosis of SARS-CoV-2 by FDA under an Emergency Use Authorization (EUA). This EUA will remain in  effect (meaning this test can be used) for the duration of the COVID-19 declaration under Section 564(b)(1) of the Act, 21 U.S.C. section 360bbb-3(b)(1), unless the authorization is terminated or revoked.  Performed at [facility], [place] [address] [place], [place] [address]   Culture, blood (routine x 2)     Status: Abnormal    Collection Time: 09/25/20  9:46 PM   Specimen: BLOOD LEFT HAND  Result Value Ref Range Status   Specimen Description BLOOD LEFT HAND  Final   Special Requests AEROBIC BOTTLE ONLY Blood Culture adequate volume  Final   Culture  Setup Time   Final    GRAM NEGATIVE RODS AEROBIC BOTTLE ONLY CRITICAL RESULT CALLED TO, READ BACK BY AND VERIFIED WITH: PHARMD FRANK WILSON 09/27/20'@00'$ :40 BY TW Performed at [facility], [place] [address] [place], [place] [address]    Culture SERRATIA MARCESCENS (A)  Final   Report Status 09/28/2020 FINAL  Final   Organism ID, Bacteria SERRATIA MARCESCENS  Final      Susceptibility   Serratia marcescens - MIC*    CEFAZOLIN >=64 RESISTANT Resistant     CEFEPIME <=0.12 SENSITIVE Sensitive     CEFTAZIDIME <=1 SENSITIVE Sensitive     CEFTRIAXONE <=0.25 SENSITIVE Sensitive     CIPROFLOXACIN <=0.25 SENSITIVE Sensitive     GENTAMICIN <=1 SENSITIVE Sensitive     TRIMETH/SULFA <=20 SENSITIVE Sensitive     * SERRATIA MARCESCENS  Culture, blood (routine x 2)     Status: None   Collection Time: 09/25/20  9:46 PM   Specimen: BLOOD RIGHT HAND  Result Value Ref Range Status   Specimen Description BLOOD RIGHT HAND  Final   Special Requests AEROBIC BOTTLE ONLY Blood Culture adequate volume  Final   Culture   Final    NO GROWTH 5 DAYS Performed at [facility], [place] [address] [place], [place] [address]    Report Status 09/30/2020 FINAL  Final  Blood Culture ID Panel (Reflexed)     Status: Abnormal   Collection Time: 09/25/20  9:46 PM  Result Value Ref Range Status   Enterococcus faecalis NOT DETECTED NOT DETECTED Final   Enterococcus Faecium NOT DETECTED NOT DETECTED Final   Listeria monocytogenes NOT DETECTED NOT DETECTED Final   Staphylococcus species NOT DETECTED NOT DETECTED Final   Staphylococcus aureus (BCID) NOT DETECTED NOT DETECTED Final   Staphylococcus epidermidis NOT DETECTED NOT DETECTED Final   Staphylococcus lugdunensis NOT DETECTED NOT  DETECTED Final   Streptococcus species NOT DETECTED NOT DETECTED Final   Streptococcus agalactiae NOT DETECTED NOT DETECTED Final   Streptococcus pneumoniae NOT DETECTED NOT DETECTED Final   Streptococcus pyogenes NOT DETECTED NOT DETECTED Final   A.calcoaceticus-baumannii NOT DETECTED NOT DETECTED Final   Bacteroides fragilis NOT DETECTED NOT DETECTED Final   Enterobacterales DETECTED (A) NOT DETECTED Final    Comment: Enterobacterales represent a large order of gram negative bacteria, not a single organism. CRITICAL RESULT CALLED TO, READ BACK BY AND VERIFIED WITH: PHARMD FRANK WILSON 09/27/20'@00'$ :40 BY TW    Enterobacter cloacae complex NOT DETECTED NOT DETECTED Final   Escherichia coli NOT DETECTED NOT DETECTED Final   Klebsiella aerogenes NOT DETECTED NOT DETECTED Final   Klebsiella oxytoca NOT DETECTED NOT DETECTED Final   Klebsiella pneumoniae NOT DETECTED NOT DETECTED Final   Proteus species NOT DETECTED NOT DETECTED Final   Salmonella species NOT DETECTED NOT DETECTED Final   Serratia marcescens DETECTED (A) NOT DETECTED Final    Comment:  CRITICAL RESULT CALLED TO, READ BACK BY AND VERIFIED WITH: PHARMD FRANK WILSON 09/27/20'@00'$ :40 BY TW    Haemophilus influenzae NOT DETECTED NOT DETECTED Final   Neisseria meningitidis NOT DETECTED NOT DETECTED Final   Pseudomonas aeruginosa NOT DETECTED NOT DETECTED Final   Stenotrophomonas maltophilia NOT DETECTED NOT DETECTED Final   Candida albicans NOT DETECTED NOT DETECTED Final   Candida auris NOT DETECTED NOT DETECTED Final   Candida glabrata NOT DETECTED NOT DETECTED Final   Candida krusei NOT DETECTED NOT DETECTED Final   Candida parapsilosis NOT DETECTED NOT DETECTED Final   Candida tropicalis NOT DETECTED NOT DETECTED Final   Cryptococcus neoformans/gattii NOT DETECTED NOT DETECTED Final   CTX-M ESBL NOT DETECTED NOT DETECTED Final   Carbapenem resistance IMP NOT DETECTED NOT DETECTED Final   Carbapenem resistance KPC NOT DETECTED  NOT DETECTED Final   Carbapenem resistance NDM NOT DETECTED NOT DETECTED Final   Carbapenem resist OXA 48 LIKE NOT DETECTED NOT DETECTED Final   Carbapenem resistance VIM NOT DETECTED NOT DETECTED Final    Comment: Performed at [facility], [address] [place], [place] [address]  Urine Culture     Status: None   Collection Time: 09/26/20 11:06 AM   Specimen: Urine, Catheterized  Result Value Ref Range Status   Specimen Description URINE, CATHETERIZED  Final   Special Requests NONE  Final   Culture   Final    NO GROWTH Performed at [facility], [address] [place], [place] [address]    Report Status 09/27/2020 FINAL  Final  Culture, blood (routine x 2)     Status: None   Collection Time: 09/27/20  8:17 AM   Specimen: BLOOD  Result Value Ref Range Status   Specimen Description BLOOD LEFT ANTECUBITAL  Final   Special Requests   Final    BOTTLES DRAWN AEROBIC AND ANAEROBIC Blood Culture adequate volume   Culture   Final    NO GROWTH 5 DAYS Performed at [facility], [place] [address], [place], [place] [address]    Report Status 10/02/2020 FINAL  Final  Culture, blood (routine x 2)     Status: None   Collection Time: 09/27/20  8:19 AM   Specimen: BLOOD  Result Value Ref Range Status   Specimen Description BLOOD BLOOD RIGHT HAND  Final   Special Requests   Final    BOTTLES DRAWN AEROBIC AND ANAEROBIC Blood Culture adequate volume   Culture   Final    NO GROWTH 5 DAYS Performed at [facility], [place] [address], [place],  [address]    Report Status 10/02/2020 FINAL  Final          Radiology Studies: No results found.      Scheduled Meds:  buPROPion  150 mg Oral q morning   Chlorhexidine Gluconate Cloth  6 each Topical Daily   clopidogrel  75 mg Oral Daily   docusate sodium  100 mg Oral BID   DULoxetine  60 mg Oral Daily   feeding supplement (GLUCERNA SHAKE)  237 mL Oral TID BM   fluconazole  200 mg Oral Daily   influenza  vaccine adjuvanted  0.5 mL Intramuscular Tomorrow-1000   insulin aspart  0-20 Units Subcutaneous TID WC   insulin detemir  20 Units Subcutaneous Q2200   isosorbide mononitrate  30 mg Oral Daily   ketorolac  1 drop Right Eye Daily   magnesium oxide  400 mg Oral BID  mouth rinse  15 mL Mouth Rinse BID   melatonin  3 mg Oral QHS   metoprolol succinate  25 mg Oral Daily   multivitamin with minerals  1 tablet Oral Daily   nystatin  5 mL Oral QID   omega-3 acid ethyl esters  1 g Oral Daily   pantoprazole  40 mg Oral QHS   polyethylene glycol  17 g Oral Daily   rosuvastatin  20 mg Oral Q2200   senna  1 tablet Oral BID   tamsulosin  0.4 mg Oral Daily   valACYclovir  1,000 mg Oral BID   vitamin B-12  1,000 mcg Oral Daily   Continuous Infusions:  ertapenem 1,000 mg (10/03/20 0215)      LOS: 15 days    Time spent: over 64 min    Nita Sells, MD [facility]   To contact the attending provider between 7A-7P or the covering provider during after hours 7P-7A, please log into the web site [email or web address] and access using universal [place] password for that web site. If you do not have the password, please call the hospital operator.  10/03/2020, 11:16 AM

## 2020-10-03 NOTE — Progress Notes (Signed)
Patient continues to show improvement with mentation as the day progresses. Patient expressing desire to have some water. Swallowing thin liquids appropriately and medications administered as tolerated. Will assess ability to take additional medications when lunch tray arrives. Wife at bedside educated on safety measures.

## 2020-10-03 NOTE — Progress Notes (Signed)
Physical Therapy Treatment Patient Details Name: Ricky Nelson MRN: [id number] DOB: 1935/08/22 Today's Date: 10/03/2020   History of Present Illness Pt is an 85 y/o male admitted to [facility] on 8/30 for L2-4 fusion with extension of hardware L2-L5. PMH significant for HTN, DM2, Bladder cancer, MI in 1995, Sleep apnea.    PT Comments    Treatment very limited this am due to patient too lethargic to participate and follow commands.  Assisted nursing with repositioning and decided to hold on EOB/OOB activity till pt more alert and able to assist.  Remains appropriate for SNF level rehab at d/c.     Recommendations for follow up therapy are one component of a multi-disciplinary discharge planning process, led by the attending physician.  Recommendations may be updated based on patient status, additional functional criteria and insurance authorization.  Follow Up Recommendations  SNF;Supervision/Assistance - 24 hour     Equipment Recommendations  Rolling walker with 5" wheels    Recommendations for Other Services       Precautions / Restrictions Precautions Precautions: Back;Fall Required Braces or Orthoses: Spinal Brace     Mobility  Bed Mobility Overal bed mobility: Needs Assistance Bed Mobility: Rolling Rolling: Max assist;+2 for physical assistance         General bed mobility comments: rolled in bed to assist nursing with placing pads under him as they just finished cleaning him up. he could help with rolling R, but resisted and grimaced rolling to L; scooted up in bed with +2 A    Transfers                    Ambulation/Gait                 Stairs             Wheelchair Mobility    Modified Rankin (Stroke Patients Only)       Balance                                            Cognition Arousal/Alertness: Lethargic;Suspect due to medications Behavior During Therapy: Flat affect Overall Cognitive Status: Difficult to  assess                                        Exercises      General Comments        Pertinent Vitals/Pain Pain Assessment: Faces Faces Pain Scale: Hurts even more Pain Location: grimacing and resisting rolling to L Pain Descriptors / Indicators: Discomfort;Grimacing Pain Intervention(s): Monitored during session;Repositioned;Limited activity within patient's tolerance    Home Living                      Prior Function            PT Goals (current goals can now be found in the care plan section) Acute Rehab PT Goals Potential to Achieve Goals: Fair Progress towards PT goals: Not progressing toward goals - comment    Frequency    Min 3X/week      PT Plan Current plan remains appropriate;Frequency needs to be updated    Co-evaluation              AM-PAC PT "6 Clicks" Mobility   Outcome Measure  Help needed turning from your back to your side while in a flat bed without using bedrails?: A Lot Help needed moving from lying on your back to sitting on the side of a flat bed without using bedrails?: Total Help needed moving to and from a bed to a chair (including a wheelchair)?: Total Help needed standing up from a chair using your arms (e.g., wheelchair or bedside chair)?: Total Help needed to walk in hospital room?: Total Help needed climbing 3-5 steps with a railing? : Total 6 Click Score: 7    End of Session   Activity Tolerance: Patient limited by lethargy Patient left: in bed;with call bell/phone within reach;with family/visitor present;with bed alarm set   PT Visit Diagnosis: Other abnormalities of gait and mobility (R26.89);Muscle weakness (generalized) (M62.81);Other symptoms and signs involving the nervous system [id number])     Time: [id number] PT Time Calculation (min) (ACUTE ONLY): 14 min  Charges:  $Therapeutic Activity: 8-22 mins                     Magda Kiel, PT Acute Rehabilitation  Services [id number] Office:[phone] 10/03/2020    Reginia Naas 10/03/2020, 1:42 PM

## 2020-10-03 NOTE — Hospital Course (Signed)
Sodium 134 BUN/creatinine 37/1.8-->30/1.98 WBC 9.1-->10.7, hemoglobin 9.9

## 2020-10-03 NOTE — Progress Notes (Signed)
[facility]  Date of Admission:  09/18/2020     Lines:  Peripheral iv's   Abx: 9/7-c cefepime   9/7-12 vanc                                                          Assessment: Serratia bacteremia S/p on 8/30 L2/3 laminectomy with anterior interbody device and posterior L2-L5 instrumentation; removal previous rod L4-5 Clinical herpes and candida stomatitis Genital diaper rash with candidiasis Back heat rash   85 yo male dm2, ckd3, htn, osa, prior lumbar instrumentation, admitted 8/30 for semielective lumbar decompression surgery with new instrumentation/removal old L4-5 rod, course complicated by nosocomial serratia bacteremia unclear source, clinical hsv stomatitis, metabolic encephalopathy, and a rash   Oral lesions appear to look like hsv stomatitis vs candida He has genital rash (wearing depend-diapers) which appear to be moisture related with also feature of candida Body rash only in the back appears to be heat rash not hsv and not bacterial folliculitis. Ddx yeast folliculitis Mentation more consistent with delirium but clinically doesn't appear to be hsv encephalitis.    Serratia bacteremia 9/6 concerning for surgical site occult involvement. Would treat longer. Plan 6 weeks iv/oral. For now would keep iv and transition to oral cipro outpatient   ---------- 9/14 assessment Rash in groin/oraopharynx improving, not worse with back Delirium remains but not worse No fever, chill   Plan: Continue ertapenem Finish 2 weeks valtrex/fluconazole 9/27 No need for oral nystatin squish -- have dc'ed Nystatin powder for groin Will continue to follow  I spent more than 35 minute reviewing data/chart, and coordinating care and >50% direct face to face time providing counseling/discussing diagnostics/treatment plan with patient  Active Problems:   Lumbar spinal stenosis   Sleep apnea   Diabetes mellitus without complication (HCC)    Coronary artery disease   CKD (chronic kidney disease)   SIRS (systemic inflammatory response syndrome) (HCC)   Hypoxia   Pleural effusion   Delirium   Serratia septicemia (HCC)   Herpes stomatitis   Candidal stomatitis   Diaper rash   Heat rash   S/P lumbar spine operation   Fixation hardware in spine   Allergies  Allergen Reactions   Allergy Relief [Chlorpheniramine Maleate] Shortness Of Breath   Diazepam     Other reaction(s): Hypotension, Low blood pressure, Other (See Comments)   Diphenhydramine Hcl Shortness Of Breath    Other reaction(s): Other (See Comments), Stopped breathing Other reaction(s): Shortness Of Breath Shortness of breath with benadryl    Diphenhydramine-Zinc Acetate Shortness Of Breath   Atorvastatin Other (See Comments)    Other reaction(s): Muscle pain, Other (See Comments), Unknown Elevated CK      Fluvastatin Other (See Comments)    Other reaction(s): Other (See Comments) myalgias   Lisinopril     Other reaction(s): Hyperkalemia, Other (See Comments)   Metformin And Related     Other reaction(s): Other Renal function   Piperacillin-Tazobactam In Dex Rash    Other reaction(s): Rash Other reaction(s): Rash    Diphenhydramine     Other reaction(s): Low blood pressure   Other Other (See Comments)    Lescol    Scheduled Meds:  buPROPion  150 mg Oral q morning   clopidogrel  75 mg  Oral Daily   docusate sodium  100 mg Oral BID   DULoxetine  60 mg Oral Daily   feeding supplement (GLUCERNA SHAKE)  237 mL Oral TID BM   fluconazole  200 mg Oral Daily   influenza vaccine adjuvanted  0.5 mL Intramuscular Tomorrow-1000   insulin aspart  0-20 Units Subcutaneous TID WC   insulin detemir  20 Units Subcutaneous Q2200   isosorbide mononitrate  30 mg Oral Daily   ketorolac  1 drop Right Eye Daily   magnesium oxide  400 mg Oral BID   mouth rinse  15 mL Mouth Rinse BID   melatonin  3 mg Oral QHS   metoprolol succinate  25 mg Oral Daily    multivitamin with minerals  1 tablet Oral Daily   omega-3 acid ethyl esters  1 g Oral Daily   pantoprazole  40 mg Oral QHS   polyethylene glycol  17 g Oral Daily   rosuvastatin  20 mg Oral Q2200   senna  1 tablet Oral BID   tamsulosin  0.4 mg Oral Daily   valACYclovir  1,000 mg Oral BID   vitamin B-12  1,000 mcg Oral Daily   Continuous Infusions:  ertapenem 1,000 mg (10/03/20 0215)   PRN Meds:.acetaminophen **OR** acetaminophen, bisacodyl, fluticasone, ipratropium-albuterol, lidocaine, menthol-cetylpyridinium **OR** phenol, nitroGLYCERIN, ondansetron **OR** ondansetron (ZOFRAN) IV, senna-docusate, triamcinolone cream   SUBJECTIVE: Rash improving No f/c No complaint Delirium still waxing/waning  Review of Systems: ROS All other ROS was negative, except mentioned above     OBJECTIVE: Vitals:   10/03/20 0304 10/03/20 0450 10/03/20 0807 10/03/20 1146  BP: (!) 146/90  (!) 155/86 (!) 160/96  Pulse:   99 (!) 106  Resp: '16  13 15  '$ Temp: 99.5 F (37.5 C)  98.1 F (36.7 C) 98.5 F (36.9 C)  TempSrc: Axillary  Oral Axillary  SpO2:   97% 97%  Weight:  93.3 kg    Height:       Body mass index is 30.38 kg/m.  Physical Exam  General/constitutional: no distress, pleasant; not much verbal today, but cooperative with simple command HEENT: Normocephalic, PER, Conj Clear, EOMI, Oropharynx tongue less white plaque but the ulcers remain, not more numerous Neck supple CV: rrr no mrg Lungs: clear to auscultation, normal respiratory effort Abd: Soft, Nontender Ext: no edema Skin: groin maceration/ertyehma improving; stable back maculopapular erythema Neuro: generalized weakness MSK: back incision intact no fluctuance/tenderness/erythema   Lab Results Lab Results  Component Value Date   WBC 10.7 (H) 10/02/2020   HGB 9.9 (L) 10/02/2020   HCT 31.5 (L) 10/02/2020   MCV 94.9 10/02/2020   PLT 251 10/02/2020    Lab Results  Component Value Date   CREATININE 1.98 (H) 10/03/2020    BUN 34 (H) 10/03/2020   NA 134 (L) 10/03/2020   K 4.4 10/03/2020   CL 97 (L) 10/03/2020   CO2 27 10/03/2020    Lab Results  Component Value Date   ALT 26 10/02/2020   AST 24 10/02/2020   ALKPHOS 77 10/02/2020   BILITOT 0.8 10/02/2020      Microbiology: Recent Results (from the past 240 hour(s))  Resp Panel by RT-PCR (Flu A&B, Covid) Nasopharyngeal Swab     Status: None   Collection Time: 09/25/20 12:44 PM   Specimen: Nasopharyngeal Swab; Nasopharyngeal(NP) swabs in vial transport medium  Result Value Ref Range Status   SARS Coronavirus 2 by RT PCR NEGATIVE NEGATIVE Final    Comment: (NOTE) SARS-CoV-2 target nucleic acids  are NOT DETECTED.  The SARS-CoV-2 RNA is generally detectable in upper respiratory specimens during the acute phase of infection. The lowest concentration of SARS-CoV-2 viral copies this assay can detect is 138 copies/mL. A negative result does not preclude SARS-Cov-2 infection and should not be used as the sole basis for treatment or other patient management decisions. A negative result may occur with  improper specimen collection/handling, submission of specimen other than nasopharyngeal swab, presence of viral mutation(s) within the areas targeted by this assay, and inadequate number of viral copies(<138 copies/mL). A negative result must be combined with clinical observations, patient history, and epidemiological information. The expected result is Negative.  Fact Sheet for Patients:  EntrepreneurPulse.com.au  Fact Sheet for Healthcare Providers:  IncredibleEmployment.be  This test is no t yet approved or cleared by the [place] FDA and  has been authorized for detection and/or diagnosis of SARS-CoV-2 by FDA under an Emergency Use Authorization (EUA). This EUA will remain  in effect (meaning this test can be used) for the duration of the COVID-19 declaration under Section 564(b)(1) of the Act,  21 U.S.C.section 360bbb-3(b)(1), unless the authorization is terminated  or revoked sooner.       Influenza A by PCR NEGATIVE NEGATIVE Final   Influenza B by PCR NEGATIVE NEGATIVE Final    Comment: (NOTE) The Xpert Xpress SARS-CoV-2/FLU/RSV plus assay is intended as an aid in the diagnosis of influenza from Nasopharyngeal swab specimens and should not be used as a sole basis for treatment. Nasal washings and aspirates are unacceptable for Xpert Xpress SARS-CoV-2/FLU/RSV testing.  Fact Sheet for Patients: EntrepreneurPulse.com.au  Fact Sheet for Healthcare Providers: IncredibleEmployment.be  This test is not yet approved or cleared by the [place] FDA and has been authorized for detection and/or diagnosis of SARS-CoV-2 by FDA under an Emergency Use Authorization (EUA). This EUA will remain in effect (meaning this test can be used) for the duration of the COVID-19 declaration under Section 564(b)(1) of the Act, 21 U.S.C. section 360bbb-3(b)(1), unless the authorization is terminated or revoked.  Performed at [facility], [place] [address] [place], [place] [address]   Culture, blood (routine x 2)     Status: Abnormal   Collection Time: 09/25/20  9:46 PM   Specimen: BLOOD LEFT HAND  Result Value Ref Range Status   Specimen Description BLOOD LEFT HAND  Final   Special Requests AEROBIC BOTTLE ONLY Blood Culture adequate volume  Final   Culture  Setup Time   Final    GRAM NEGATIVE RODS AEROBIC BOTTLE ONLY CRITICAL RESULT CALLED TO, READ BACK BY AND VERIFIED WITH: PHARMD FRANK WILSON 09/27/20'@00'$ :40 BY TW Performed at [facility], [place] [address] [place], [place] [address]    Culture SERRATIA MARCESCENS (A)  Final   Report Status 09/28/2020 FINAL  Final   Organism ID, Bacteria SERRATIA MARCESCENS  Final      Susceptibility   Serratia marcescens - MIC*    CEFAZOLIN >=64 RESISTANT Resistant     CEFEPIME <=0.12 SENSITIVE  Sensitive     CEFTAZIDIME <=1 SENSITIVE Sensitive     CEFTRIAXONE <=0.25 SENSITIVE Sensitive     CIPROFLOXACIN <=0.25 SENSITIVE Sensitive     GENTAMICIN <=1 SENSITIVE Sensitive     TRIMETH/SULFA <=20 SENSITIVE Sensitive     * SERRATIA MARCESCENS  Culture, blood (routine x 2)     Status: None   Collection Time: 09/25/20  9:46 PM   Specimen: BLOOD RIGHT HAND  Result Value Ref Range Status  Specimen Description BLOOD RIGHT HAND  Final   Special Requests AEROBIC BOTTLE ONLY Blood Culture adequate volume  Final   Culture   Final    NO GROWTH 5 DAYS Performed at [facility], [address] [place], [place] [address]    Report Status 09/30/2020 FINAL  Final  Blood Culture ID Panel (Reflexed)     Status: Abnormal   Collection Time: 09/25/20  9:46 PM  Result Value Ref Range Status   Enterococcus faecalis NOT DETECTED NOT DETECTED Final   Enterococcus Faecium NOT DETECTED NOT DETECTED Final   Listeria monocytogenes NOT DETECTED NOT DETECTED Final   Staphylococcus species NOT DETECTED NOT DETECTED Final   Staphylococcus aureus (BCID) NOT DETECTED NOT DETECTED Final   Staphylococcus epidermidis NOT DETECTED NOT DETECTED Final   Staphylococcus lugdunensis NOT DETECTED NOT DETECTED Final   Streptococcus species NOT DETECTED NOT DETECTED Final   Streptococcus agalactiae NOT DETECTED NOT DETECTED Final   Streptococcus pneumoniae NOT DETECTED NOT DETECTED Final   Streptococcus pyogenes NOT DETECTED NOT DETECTED Final   A.calcoaceticus-baumannii NOT DETECTED NOT DETECTED Final   Bacteroides fragilis NOT DETECTED NOT DETECTED Final   Enterobacterales DETECTED (A) NOT DETECTED Final    Comment: Enterobacterales represent a large order of gram negative bacteria, not a single organism. CRITICAL RESULT CALLED TO, READ BACK BY AND VERIFIED WITH: PHARMD FRANK WILSON 09/27/20'@00'$ :40 BY TW    Enterobacter cloacae complex NOT DETECTED NOT DETECTED Final   Escherichia coli NOT DETECTED NOT DETECTED  Final   Klebsiella aerogenes NOT DETECTED NOT DETECTED Final   Klebsiella oxytoca NOT DETECTED NOT DETECTED Final   Klebsiella pneumoniae NOT DETECTED NOT DETECTED Final   Proteus species NOT DETECTED NOT DETECTED Final   Salmonella species NOT DETECTED NOT DETECTED Final   Serratia marcescens DETECTED (A) NOT DETECTED Final    Comment: CRITICAL RESULT CALLED TO, READ BACK BY AND VERIFIED WITH: PHARMD FRANK WILSON 09/27/20'@00'$ :40 BY TW    Haemophilus influenzae NOT DETECTED NOT DETECTED Final   Neisseria meningitidis NOT DETECTED NOT DETECTED Final   Pseudomonas aeruginosa NOT DETECTED NOT DETECTED Final   Stenotrophomonas maltophilia NOT DETECTED NOT DETECTED Final   Candida albicans NOT DETECTED NOT DETECTED Final   Candida auris NOT DETECTED NOT DETECTED Final   Candida glabrata NOT DETECTED NOT DETECTED Final   Candida krusei NOT DETECTED NOT DETECTED Final   Candida parapsilosis NOT DETECTED NOT DETECTED Final   Candida tropicalis NOT DETECTED NOT DETECTED Final   Cryptococcus neoformans/gattii NOT DETECTED NOT DETECTED Final   CTX-M ESBL NOT DETECTED NOT DETECTED Final   Carbapenem resistance IMP NOT DETECTED NOT DETECTED Final   Carbapenem resistance KPC NOT DETECTED NOT DETECTED Final   Carbapenem resistance NDM NOT DETECTED NOT DETECTED Final   Carbapenem resist OXA 48 LIKE NOT DETECTED NOT DETECTED Final   Carbapenem resistance VIM NOT DETECTED NOT DETECTED Final    Comment: Performed at [facility], [address], [place], [place] [id number]  Urine Culture     Status: None   Collection Time: 09/26/20 11:06 AM   Specimen: Urine, Catheterized  Result Value Ref Range Status   Specimen Description URINE, CATHETERIZED  Final   Special Requests NONE  Final   Culture   Final    NO GROWTH Performed at [facility], [address], [place], [place] [address]    Report Status 09/27/2020 FINAL  Final  Culture, blood (routine x 2)     Status: None  Collection Time:  09/27/20  8:17 AM   Specimen: BLOOD  Result Value Ref Range Status   Specimen Description BLOOD LEFT ANTECUBITAL  Final   Special Requests   Final    BOTTLES DRAWN AEROBIC AND ANAEROBIC Blood Culture adequate volume   Culture   Final    NO GROWTH 5 DAYS Performed at [facility], [address] [place], [place] [address]    Report Status 10/02/2020 FINAL  Final  Culture, blood (routine x 2)     Status: None   Collection Time: 09/27/20  8:19 AM   Specimen: BLOOD  Result Value Ref Range Status   Specimen Description BLOOD BLOOD RIGHT HAND  Final   Special Requests   Final    BOTTLES DRAWN AEROBIC AND ANAEROBIC Blood Culture adequate volume   Culture   Final    NO GROWTH 5 DAYS Performed at [facility], [place] [address], [place], [place] [address]    Report Status 10/02/2020 FINAL  Final     Serology:   Imaging: If present, new imagings (plain films, ct scans, and mri) have been personally visualized and interpreted; radiology reports have been reviewed. Decision making incorporated into the Impression / Recommendations.  9/11 cxr  No acute cardiopulm process   9/6 ct chest angio No visible pulmonary embolus. Pulmonary arteries obscured in the lung bases due to respiratory motion.   Small right pleural effusion.  Bibasilar atelectasis. Jabier Mutton, [facility] [phone] pager    10/03/2020, 11:57 AM

## 2020-10-03 NOTE — Progress Notes (Signed)
  NEUROSURGERY PROGRESS NOTE   No issues overnight, was confused had restraints replaced.  EXAM:  BP (!) 155/86 (BP Location: Left Wrist)   Pulse 99   Temp 98.1 F (36.7 C) (Oral)   Resp 13   Ht '5\' 9"'$  (1.753 m)   Wt 93.3 kg   SpO2 97%   BMI 30.38 kg/m   Drowsy but arouses Responds verbally but not oriented Speech fluent CN grossly intact  Good strength throughout Wound remains stable  IMPRESSION:  85 y.o. male s/p lumbar fusion with toxic/metabolic encephalopathy.   Serratia bacteremia appears to have cleared HSV stomatitis Candidal rash  PLAN: - Appreciate ID/medicine assistance. Will cont IV ertapenem for longer term course of tx. - Cont acyclovir - Cont therapy services - Hopefully if his delirium improves we can remove restraints and begin to look at placement again   Consuella Lose, MD [facility]

## 2020-10-03 NOTE — Progress Notes (Signed)
Patient continues to be confused. Easily aroused but with nonsensical speech. Oral care offered and a sip of water but patient not seeming to follow commands. Attending MD at bedside and made aware that he is not alert enough to take medication by mouth at this time. BP and HR elevated and will continue to monitor for ability to take medications by mouth. Restraints removed during incontinence care but reapplied as patient continues to pull at oxygen and lines. Will continue to assess readiness to remove restraints.

## 2020-10-04 DIAGNOSIS — B002 Herpesviral gingivostomatitis and pharyngotonsillitis: Secondary | ICD-10-CM | POA: Diagnosis not present

## 2020-10-04 DIAGNOSIS — Z419 Encounter for procedure for purposes other than remedying health state, unspecified: Secondary | ICD-10-CM | POA: Diagnosis not present

## 2020-10-04 DIAGNOSIS — A4153 Sepsis due to Serratia: Secondary | ICD-10-CM | POA: Diagnosis not present

## 2020-10-04 DIAGNOSIS — Z9889 Other specified postprocedural states: Secondary | ICD-10-CM | POA: Diagnosis not present

## 2020-10-04 DIAGNOSIS — B370 Candidal stomatitis: Secondary | ICD-10-CM | POA: Diagnosis not present

## 2020-10-04 LAB — CBC WITH DIFFERENTIAL/PLATELET
Abs Immature Granulocytes: 0.05 10*3/uL (ref 0.00–0.07)
Basophils Absolute: 0 10*3/uL (ref 0.0–0.1)
Basophils Relative: 0 %
Eosinophils Absolute: 0.2 10*3/uL (ref 0.0–0.5)
Eosinophils Relative: 3 %
HCT: 32.1 % — ABNORMAL LOW (ref 39.0–52.0)
Hemoglobin: 10.1 g/dL — ABNORMAL LOW (ref 13.0–17.0)
Immature Granulocytes: 1 %
Lymphocytes Relative: 14 %
Lymphs Abs: 1.1 10*3/uL (ref 0.7–4.0)
MCH: 30 pg (ref 26.0–34.0)
MCHC: 31.5 g/dL (ref 30.0–36.0)
MCV: 95.3 fL (ref 80.0–100.0)
Monocytes Absolute: 1 10*3/uL (ref 0.1–1.0)
Monocytes Relative: 13 %
Neutro Abs: 5.6 10*3/uL (ref 1.7–7.7)
Neutrophils Relative %: 69 %
Platelets: 236 10*3/uL (ref 150–400)
RBC: 3.37 MIL/uL — ABNORMAL LOW (ref 4.22–5.81)
RDW: 14.2 % (ref 11.5–15.5)
WBC: 8.1 10*3/uL (ref 4.0–10.5)
nRBC: 0 % (ref 0.0–0.2)

## 2020-10-04 LAB — COMPREHENSIVE METABOLIC PANEL
ALT: 21 U/L (ref 0–44)
AST: 23 U/L (ref 15–41)
Albumin: 2.5 g/dL — ABNORMAL LOW (ref 3.5–5.0)
Alkaline Phosphatase: 85 U/L (ref 38–126)
Anion gap: 11 (ref 5–15)
BUN: 39 mg/dL — ABNORMAL HIGH (ref 8–23)
CO2: 25 mmol/L (ref 22–32)
Calcium: 9.2 mg/dL (ref 8.9–10.3)
Chloride: 98 mmol/L (ref 98–111)
Creatinine, Ser: 2.35 mg/dL — ABNORMAL HIGH (ref 0.61–1.24)
GFR, Estimated: 27 mL/min — ABNORMAL LOW (ref >60)
Glucose, Bld: 167 mg/dL — ABNORMAL HIGH (ref 70–99)
Potassium: 4.2 mmol/L (ref 3.5–5.1)
Sodium: 134 mmol/L — ABNORMAL LOW (ref 135–145)
Total Bilirubin: 0.8 mg/dL (ref 0.3–1.2)
Total Protein: 7.3 g/dL (ref 6.5–8.1)

## 2020-10-04 LAB — GLUCOSE, CAPILLARY
Glucose-Capillary: 170 mg/dL — ABNORMAL HIGH (ref 70–99)
Glucose-Capillary: 287 mg/dL — ABNORMAL HIGH (ref 70–99)
Glucose-Capillary: 172 mg/dL — ABNORMAL HIGH (ref 70–99)
Glucose-Capillary: 117 mg/dL — ABNORMAL HIGH (ref 70–99)

## 2020-10-04 MED ORDER — ISOSORBIDE MONONITRATE ER 30 MG PO TB24
15.0000 mg | ORAL_TABLET | Freq: Every day | ORAL | Status: DC
  Administered 2020-10-05 – 2020-10-11 (×7): 15 mg via ORAL
  Filled 2020-10-04 (×7): qty 1

## 2020-10-04 MED ORDER — VALACYCLOVIR HCL 500 MG PO TABS
1000.0000 mg | ORAL_TABLET | Freq: Every day | ORAL | Status: DC
  Administered 2020-10-04 – 2020-10-11 (×8): 1000 mg via ORAL
  Filled 2020-10-04 (×8): qty 2

## 2020-10-04 MED ORDER — SODIUM CHLORIDE 0.9 % IV SOLN
500.0000 mg | INTRAVENOUS | Status: DC
  Administered 2020-10-05 – 2020-10-06 (×2): 500 mg via INTRAVENOUS
  Filled 2020-10-04 (×2): qty 0.5

## 2020-10-04 NOTE — Plan of Care (Signed)
  Problem: Education: Goal: Ability to verbalize activity precautions or restrictions will improve Outcome: Progressing Goal: Knowledge of the prescribed therapeutic regimen will improve Outcome: Progressing Goal: Understanding of discharge needs will improve Outcome: Progressing   Problem: Activity: Goal: Ability to avoid complications of mobility impairment will improve Outcome: Progressing Goal: Ability to tolerate increased activity will improve Outcome: Progressing Goal: Will remain free from falls Outcome: Progressing   Problem: Bowel/Gastric: Goal: Gastrointestinal status for postoperative course will improve Outcome: Progressing   Problem: Clinical Measurements: Goal: Ability to maintain clinical measurements within normal limits will improve Outcome: Progressing Goal: Postoperative complications will be avoided or minimized Outcome: Progressing Goal: Diagnostic test results will improve Outcome: Progressing   Problem: Pain Management: Goal: Pain level will decrease Outcome: Progressing   Problem: Skin Integrity: Goal: Will show signs of wound healing Outcome: Progressing   Problem: Health Behavior/Discharge Planning: Goal: Identification of resources available to assist in meeting health care needs will improve Outcome: Progressing   Problem: Bladder/Genitourinary: Goal: Urinary functional status for postoperative course will improve Outcome: Progressing   Problem: Safety: Goal: Ability to remain free from injury will improve Outcome: Progressing   Problem: Safety: Goal: Non-violent Restraint(s) Outcome: Progressing

## 2020-10-04 NOTE — Progress Notes (Signed)
  NEUROSURGERY PROGRESS NOTE   No issues overnight. Pt much more alert this am, conversant with me and his wife at bedside.   EXAM:  BP (!) 94/53 (BP Location: Right Arm)   Pulse (!) 110   Temp 98.1 F (36.7 C) (Oral)   Resp 20   Ht '5\' 9"'$  (1.753 m)   Wt 93.3 kg   SpO2 95%   BMI 30.38 kg/m   Awake, alert, remains confused  Speech fluent CN grossly intact  5/5 BUE/BLE   IMPRESSION:  85 y.o. male s/p lumbar fusion. Likely toxic/metabolic encephalopthy.  Oral HSV stomatitis appears improving Serratia bacteremia appears resolved Anemia - Hemoglobin remains stable AKI - cont to trend, Cr increased today  PLAN: - Cont abx/anti-virals per ID recs - Cont PT/OT   Consuella Lose, MD [facility]

## 2020-10-04 NOTE — Progress Notes (Signed)
[facility]  Date of Admission:  09/18/2020     Lines:  Peripheral iv's   Abx: 9/14-c ertapenem  9/7-9/14 cefepime 9/7-12 vanc                                                          Assessment: Serratia bacteremia S/p on 8/30 L2/3 laminectomy with anterior interbody device and posterior L2-L5 instrumentation; removal previous rod L4-5 Clinical herpes and candida stomatitis Genital diaper rash with candidiasis Back heat rash   85 yo male dm2, ckd3, htn, osa, prior lumbar instrumentation, admitted 8/30 for semielective lumbar decompression surgery with new instrumentation/removal old L4-5 rod, course complicated by nosocomial serratia bacteremia unclear source, clinical hsv stomatitis, metabolic encephalopathy, and a rash   Oral lesions appear to look like hsv stomatitis vs candida He has genital rash (wearing depend-diapers) which appear to be moisture related with also feature of candida Body rash only in the back appears to be heat rash not hsv and not bacterial folliculitis. Ddx yeast folliculitis Mentation more consistent with delirium but clinically doesn't appear to be hsv encephalitis.    Serratia bacteremia 9/6 concerning for surgical site occult involvement. Would treat longer. Plan 6 weeks iv/oral. For now would keep iv and transition to oral cipro outpatient   ---------- 9/15 assessment Rash in all area continuing to improve on valtrex/fluconazole. Patient eating better and less oral pain today, and more alert Surgical site continues to look well without obvious gross evidence of infection   Plan: Continue ertapenem Finish valtrex/fluconazole on 9/27 If continued improvement by tomorrow in terms of oral intake, can switch ertapenem to ciprofloxacin renally dosed to finish the 6 week course on 10/19 4.   Id f/u on 10/07 @ 945 am at [facility] with me 5.   Will sign off 6.   Discussed with primary team  I spent more than  35 minute reviewing data/chart, and coordinating care and >50% direct face to face time providing counseling/discussing diagnostics/treatment plan with patient  Active Problems:   Lumbar spinal stenosis   Sleep apnea   Diabetes mellitus without complication (HCC)   Coronary artery disease   CKD (chronic kidney disease)   SIRS (systemic inflammatory response syndrome) (HCC)   Hypoxia   Pleural effusion   Delirium   Serratia septicemia (HCC)   Herpes stomatitis   Candidal stomatitis   Diaper rash   Heat rash   S/P lumbar spine operation   Fixation hardware in spine   Allergies  Allergen Reactions   Allergy Relief [Chlorpheniramine Maleate] Shortness Of Breath   Diazepam     Other reaction(s): Hypotension, Low blood pressure, Other (See Comments)   Diphenhydramine Hcl Shortness Of Breath    Other reaction(s): Other (See Comments), Stopped breathing Other reaction(s): Shortness Of Breath Shortness of breath with benadryl    Diphenhydramine-Zinc Acetate Shortness Of Breath   Atorvastatin Other (See Comments)    Other reaction(s): Muscle pain, Other (See Comments), Unknown Elevated CK      Fluvastatin Other (See Comments)    Other reaction(s): Other (See Comments) myalgias   Lisinopril     Other reaction(s): Hyperkalemia, Other (See Comments)   Metformin And Related     Other reaction(s): Other Renal function   Piperacillin-Tazobactam In Dex  Rash    Other reaction(s): Rash Other reaction(s): Rash    Diphenhydramine     Other reaction(s): Low blood pressure   Other Other (See Comments)    Lescol    Scheduled Meds:  buPROPion  150 mg Oral q morning   clopidogrel  75 mg Oral Daily   docusate sodium  100 mg Oral BID   DULoxetine  60 mg Oral Daily   feeding supplement (GLUCERNA SHAKE)  237 mL Oral TID BM   fluconazole  200 mg Oral Daily   influenza vaccine adjuvanted  0.5 mL Intramuscular Tomorrow-1000   insulin aspart  0-20 Units Subcutaneous TID WC   insulin  detemir  20 Units Subcutaneous Q2200   isosorbide mononitrate  30 mg Oral Daily   ketorolac  1 drop Right Eye Daily   magnesium oxide  400 mg Oral BID   mouth rinse  15 mL Mouth Rinse BID   melatonin  3 mg Oral QHS   metoprolol succinate  25 mg Oral Daily   multivitamin with minerals  1 tablet Oral Daily   omega-3 acid ethyl esters  1 g Oral Daily   pantoprazole  40 mg Oral QHS   polyethylene glycol  17 g Oral Daily   rosuvastatin  20 mg Oral Q2200   senna  1 tablet Oral BID   tamsulosin  0.4 mg Oral Daily   valACYclovir  1,000 mg Oral Daily   vitamin B-12  1,000 mcg Oral Daily   Continuous Infusions:  [START ON 10/05/2020] ertapenem     PRN Meds:.acetaminophen **OR** acetaminophen, bisacodyl, fluticasone, ipratropium-albuterol, lidocaine, menthol-cetylpyridinium **OR** phenol, nitroGLYCERIN, ondansetron **OR** ondansetron (ZOFRAN) IV, senna-docusate, triamcinolone cream   SUBJECTIVE: Feels to have more appetite Less oral pain No f/c/n/v Rash better on back/groin   Review of Systems: ROS All other ROS was negative, except mentioned above     OBJECTIVE: Vitals:   10/04/20 0327 10/04/20 0328 10/04/20 0744 10/04/20 1121  BP: (!) 150/79 (!) 150/79 128/69 (!) 94/53  Pulse: (!) 105 (!) 107 (!) 101 (!) 110  Resp: 14 (!) '25 10 20  '$ Temp: 98.2 F (36.8 C)  98 F (36.7 C) 98.1 F (36.7 C)  TempSrc: Oral  Oral Oral  SpO2: 94% 96% 98% 95%  Weight:      Height:       Body mass index is 30.38 kg/m.  Physical Exam  General/constitutional: no distress, pleasant; improved interaction with exam; more verbal/conversant HEENT: continued to improved in terms of size/depth of oral ulcer Neck supple CV: rrr no mrg Lungs: clear; normal respiratory effort Abd: soft nontender Ext: no edema Skin: groin maceration/ertyehma continues to improve; back rash much better Neuro: generalized weakness MSK: back incision intact no fluctuance/tenderness/erythema   Lab Results Lab  Results  Component Value Date   WBC 8.1 10/04/2020   HGB 10.1 (L) 10/04/2020   HCT 32.1 (L) 10/04/2020   MCV 95.3 10/04/2020   PLT 236 10/04/2020    Lab Results  Component Value Date   CREATININE 2.35 (H) 10/04/2020   BUN 39 (H) 10/04/2020   NA 134 (L) 10/04/2020   K 4.2 10/04/2020   CL 98 10/04/2020   CO2 25 10/04/2020    Lab Results  Component Value Date   ALT 21 10/04/2020   AST 23 10/04/2020   ALKPHOS 85 10/04/2020   BILITOT 0.8 10/04/2020      Microbiology: Recent Results (from the past 240 hour(s))  Resp Panel by RT-PCR (Flu A&B, Covid) Nasopharyngeal Swab  Status: None   Collection Time: 09/25/20 12:44 PM   Specimen: Nasopharyngeal Swab; Nasopharyngeal(NP) swabs in vial transport medium  Result Value Ref Range Status   SARS Coronavirus 2 by RT PCR NEGATIVE NEGATIVE Final    Comment: (NOTE) SARS-CoV-2 target nucleic acids are NOT DETECTED.  The SARS-CoV-2 RNA is generally detectable in upper respiratory specimens during the acute phase of infection. The lowest concentration of SARS-CoV-2 viral copies this assay can detect is 138 copies/mL. A negative result does not preclude SARS-Cov-2 infection and should not be used as the sole basis for treatment or other patient management decisions. A negative result may occur with  improper specimen collection/handling, submission of specimen other than nasopharyngeal swab, presence of viral mutation(s) within the areas targeted by this assay, and inadequate number of viral copies(<138 copies/mL). A negative result must be combined with clinical observations, patient history, and epidemiological information. The expected result is Negative.  Fact Sheet for Patients:  EntrepreneurPulse.com.au  Fact Sheet for Healthcare Providers:  IncredibleEmployment.be  This test is no t yet approved or cleared by the [place] FDA and  has been authorized for detection and/or diagnosis of  SARS-CoV-2 by FDA under an Emergency Use Authorization (EUA). This EUA will remain  in effect (meaning this test can be used) for the duration of the COVID-19 declaration under Section 564(b)(1) of the Act, 21 U.S.C.section 360bbb-3(b)(1), unless the authorization is terminated  or revoked sooner.       Influenza A by PCR NEGATIVE NEGATIVE Final   Influenza B by PCR NEGATIVE NEGATIVE Final    Comment: (NOTE) The Xpert Xpress SARS-CoV-2/FLU/RSV plus assay is intended as an aid in the diagnosis of influenza from Nasopharyngeal swab specimens and should not be used as a sole basis for treatment. Nasal washings and aspirates are unacceptable for Xpert Xpress SARS-CoV-2/FLU/RSV testing.  Fact Sheet for Patients: EntrepreneurPulse.com.au  Fact Sheet for Healthcare Providers: IncredibleEmployment.be  This test is not yet approved or cleared by the [place] FDA and has been authorized for detection and/or diagnosis of SARS-CoV-2 by FDA under an Emergency Use Authorization (EUA). This EUA will remain in effect (meaning this test can be used) for the duration of the COVID-19 declaration under Section 564(b)(1) of the Act, 21 U.S.C. section 360bbb-3(b)(1), unless the authorization is terminated or revoked.  Performed at [facility], [place] [address] [place], [place] [address]   Culture, blood (routine x 2)     Status: Abnormal   Collection Time: 09/25/20  9:46 PM   Specimen: BLOOD LEFT HAND  Result Value Ref Range Status   Specimen Description BLOOD LEFT HAND  Final   Special Requests AEROBIC BOTTLE ONLY Blood Culture adequate volume  Final   Culture  Setup Time   Final    GRAM NEGATIVE RODS AEROBIC BOTTLE ONLY CRITICAL RESULT CALLED TO, READ BACK BY AND VERIFIED WITH: PHARMD Pilar Plate WILSON 09/27/20'@00'$ :40 BY TW Performed at [facility], [place] [address] [place], [place] [address]    Culture SERRATIA MARCESCENS (A)  Final   Report  Status 09/28/2020 FINAL  Final   Organism ID, Bacteria SERRATIA MARCESCENS  Final      Susceptibility   Serratia marcescens - MIC*    CEFAZOLIN >=64 RESISTANT Resistant     CEFEPIME <=0.12 SENSITIVE Sensitive     CEFTAZIDIME <=1 SENSITIVE Sensitive     CEFTRIAXONE <=0.25 SENSITIVE Sensitive     CIPROFLOXACIN <=0.25 SENSITIVE Sensitive     GENTAMICIN <=1 SENSITIVE Sensitive  TRIMETH/SULFA <=20 SENSITIVE Sensitive     * SERRATIA MARCESCENS  Culture, blood (routine x 2)     Status: None   Collection Time: 09/25/20  9:46 PM   Specimen: BLOOD RIGHT HAND  Result Value Ref Range Status   Specimen Description BLOOD RIGHT HAND  Final   Special Requests AEROBIC BOTTLE ONLY Blood Culture adequate volume  Final   Culture   Final    NO GROWTH 5 DAYS Performed at [facility], [place] [address], [place], [place] [address]    Report Status 09/30/2020 FINAL  Final  Blood Culture ID Panel (Reflexed)     Status: Abnormal   Collection Time: 09/25/20  9:46 PM  Result Value Ref Range Status   Enterococcus faecalis NOT DETECTED NOT DETECTED Final   Enterococcus Faecium NOT DETECTED NOT DETECTED Final   Listeria monocytogenes NOT DETECTED NOT DETECTED Final   Staphylococcus species NOT DETECTED NOT DETECTED Final   Staphylococcus aureus (BCID) NOT DETECTED NOT DETECTED Final   Staphylococcus epidermidis NOT DETECTED NOT DETECTED Final   Staphylococcus lugdunensis NOT DETECTED NOT DETECTED Final   Streptococcus species NOT DETECTED NOT DETECTED Final   Streptococcus agalactiae NOT DETECTED NOT DETECTED Final   Streptococcus pneumoniae NOT DETECTED NOT DETECTED Final   Streptococcus pyogenes NOT DETECTED NOT DETECTED Final   A.calcoaceticus-baumannii NOT DETECTED NOT DETECTED Final   Bacteroides fragilis NOT DETECTED NOT DETECTED Final   Enterobacterales DETECTED (A) NOT DETECTED Final    Comment: Enterobacterales represent a large order of gram negative bacteria, not a single  organism. CRITICAL RESULT CALLED TO, READ BACK BY AND VERIFIED WITH: PHARMD FRANK WILSON 09/27/20'@00'$ :40 BY TW    Enterobacter cloacae complex NOT DETECTED NOT DETECTED Final   Escherichia coli NOT DETECTED NOT DETECTED Final   Klebsiella aerogenes NOT DETECTED NOT DETECTED Final   Klebsiella oxytoca NOT DETECTED NOT DETECTED Final   Klebsiella pneumoniae NOT DETECTED NOT DETECTED Final   Proteus species NOT DETECTED NOT DETECTED Final   Salmonella species NOT DETECTED NOT DETECTED Final   Serratia marcescens DETECTED (A) NOT DETECTED Final    Comment: CRITICAL RESULT CALLED TO, READ BACK BY AND VERIFIED WITH: PHARMD FRANK WILSON 09/27/20'@00'$ :40 BY TW    Haemophilus influenzae NOT DETECTED NOT DETECTED Final   Neisseria meningitidis NOT DETECTED NOT DETECTED Final   Pseudomonas aeruginosa NOT DETECTED NOT DETECTED Final   Stenotrophomonas maltophilia NOT DETECTED NOT DETECTED Final   Candida albicans NOT DETECTED NOT DETECTED Final   Candida auris NOT DETECTED NOT DETECTED Final   Candida glabrata NOT DETECTED NOT DETECTED Final   Candida krusei NOT DETECTED NOT DETECTED Final   Candida parapsilosis NOT DETECTED NOT DETECTED Final   Candida tropicalis NOT DETECTED NOT DETECTED Final   Cryptococcus neoformans/gattii NOT DETECTED NOT DETECTED Final   CTX-M ESBL NOT DETECTED NOT DETECTED Final   Carbapenem resistance IMP NOT DETECTED NOT DETECTED Final   Carbapenem resistance KPC NOT DETECTED NOT DETECTED Final   Carbapenem resistance NDM NOT DETECTED NOT DETECTED Final   Carbapenem resist OXA 48 LIKE NOT DETECTED NOT DETECTED Final   Carbapenem resistance VIM NOT DETECTED NOT DETECTED Final    Comment: Performed at [facility], [address], [place], [place] [address]  Urine Culture     Status: None   Collection Time: 09/26/20 11:06 AM   Specimen: Urine, Catheterized  Result Value Ref Range Status   Specimen Description URINE, CATHETERIZED  Final   Special Requests NONE  Final  Culture   Final    NO GROWTH Performed at [facility], [place] [address], [place], [place] [address]    Report Status 09/27/2020 FINAL  Final  Culture, blood (routine x 2)     Status: None   Collection Time: 09/27/20  8:17 AM   Specimen: BLOOD  Result Value Ref Range Status   Specimen Description BLOOD LEFT ANTECUBITAL  Final   Special Requests   Final    BOTTLES DRAWN AEROBIC AND ANAEROBIC Blood Culture adequate volume   Culture   Final    NO GROWTH 5 DAYS Performed at [facility], [place] [address] [place], [place] [address]    Report Status 10/02/2020 FINAL  Final  Culture, blood (routine x 2)     Status: None   Collection Time: 09/27/20  8:19 AM   Specimen: BLOOD  Result Value Ref Range Status   Specimen Description BLOOD BLOOD RIGHT HAND  Final   Special Requests   Final    BOTTLES DRAWN AEROBIC AND ANAEROBIC Blood Culture adequate volume   Culture   Final    NO GROWTH 5 DAYS Performed at [facility], [place] [address], [place], [place] [address]    Report Status 10/02/2020 FINAL  Final     Serology:   Imaging: If present, new imagings (plain films, ct scans, and mri) have been personally visualized and interpreted; radiology reports have been reviewed. Decision making incorporated into the Impression / Recommendations.  9/11 cxr  No acute cardiopulm process   9/6 ct chest angio No visible pulmonary embolus. Pulmonary arteries obscured in the lung bases due to respiratory motion.   Small right pleural effusion.  Bibasilar atelectasis. Jabier Mutton, [place] [facility] [place] [phone] pager    10/04/2020, 1:06 PM

## 2020-10-04 NOTE — Progress Notes (Signed)
Unable to place pt on CPAP due to restraints. RN aware. Pt is currently on 2L [place]. RT will cont to monitor.

## 2020-10-04 NOTE — Progress Notes (Signed)
PROGRESS NOTE   Ricky Nelson  [id number] DOB: [birth year] DOA: 09/18/2020 PCP: Merryl Hacker, No  Brief Narrative:  85 year old white male known history of CAD CABG 1995, stent placement 2016, carotid endarterectomy 2004, CKD 3, HTN, COPD male erectile disorder, prior adenomatous polyps, bladder cancer status post BCG 2017, OSA on CPAP  Admit to neurosurgery after L2-L4 decompression, removal of old L4-5 rod fusion 09/18/2020-developed nosocomial new fever 09/25/2020 with tachycardia hypotension found to have Serratia bacteremia-ID saw the patient and they were subsequently reconsulted.  Hospitalization has been complicated by metabolic encephalopathy which is gradually clear  Hospital-Problem based course  Sepsis secondary to Serratia bacteremia 9/6 Requires 6 weeks total of treatment--will require ertapenem until safely and reliably taking p.o. and then can transition to ciprofloxacin--stop date 10/19 ID outpatient follow-up 10/27/2019 9:45 AM Periodic lab work CBC Chem-12 and further follow-up per infectious disease in the outpatient setting Rash, stomatitis HSV/probable fungal Continue Valtrex 1 g daily, fluconazole 200 mg daily until 9/27 then DC-has improved-appreciate ID input Toxic metabolic encephalopathy Secondary to bacteremia cefepime etc. Discontinue narcotics Robaxin Ativan-B12 folate all normal Note that patient also had not been on his regular BiPAP and his usual sleep cycle is he sleeps late into the morning and stays up very late at night -Improving to the point that if he looks good tomorrow we will probably sign off HFpEF, EF 50-55% inferolateral hypokinesis Outpatient evaluation-Echo this admission had some mild wall motion abnormality Continue Toprol-XL 25 mg, Crestor 20, clopidogrel 75 Cut back Imdur to 15 mg given mild hypotension Hypoxic respiratory failure on admission OSA supposed to be on nightly CPAP Imaging this admission did not confirm PE was given Lasix  earlier Seems to be resolved CKD 3B Creatinine fluctuant within the 1.8-2.5 range-monitor periodically Hold nephrotoxic Bladder diverticulum Needs outpatient dedicated CT or ultrasound DM TY 2 Glipizide Jardiance Victoza discontinued this admit Continue Levemir 20 and use sliding scale, CBG 160-287 Depression Careful use bupropion 150, Cymbalta 60 given encephalopathy or liver/  DVT prophylaxis: SCD Code Status: Full Family Communication: Son and discussed with wife at the bedside earlier today   Consultants:    Procedures:   Antimicrobials:     Subjective: Awake coherent no distress much more awake and coherent than he was earlier this morning when I first saw him as he was fast asleep and could not orient He is now sitting up in chair looks comfortable and although he misses a couple of words here when he speaks  Objective: Vitals:   10/04/20 0327 10/04/20 0328 10/04/20 0744 10/04/20 1121  BP: (!) 150/79 (!) 150/79 128/69 (!) 94/53  Pulse: (!) 105 (!) 107 (!) 101 (!) 110  Resp: 14 (!) '25 10 20  '$ Temp: 98.2 F (36.8 C)  98 F (36.7 C) 98.1 F (36.7 C)  TempSrc: Oral  Oral Oral  SpO2: 94% 96% 98% 95%  Weight:      Height:        Intake/Output Summary (Last 24 hours) at 10/04/2020 1314 Last data filed at 10/04/2020 1145 Gross per 24 hour  Intake 540 ml  Output 650 ml  Net -110 ml   Filed Weights   10/01/20 0437 10/02/20 0500 10/03/20 0450  Weight: 93.3 kg 93 kg 93.3 kg    Examination:  Pleasant obese white male no distress EOMI NCAT no focal deficit thick neck Mallampati 4 moderate dentition cannot appreciate JVD S1-S2 no murmur no rub no gallop Abdomen obese nontender nondistended Brace in place Raises arms above head  Able to raise legs although limited by weakness  Data Reviewed: personally reviewed   CBC    Component Value Date/Time   WBC 8.1 10/04/2020 0400   RBC 3.37 (L) 10/04/2020 0400   HGB 10.1 (L) 10/04/2020 0400   HCT 32.1 (L) 10/04/2020  0400   PLT 236 10/04/2020 0400   MCV 95.3 10/04/2020 0400   MCH 30.0 10/04/2020 0400   MCHC 31.5 10/04/2020 0400   RDW 14.2 10/04/2020 0400   LYMPHSABS 1.1 10/04/2020 0400   MONOABS 1.0 10/04/2020 0400   EOSABS 0.2 10/04/2020 0400   BASOSABS 0.0 10/04/2020 0400   CMP Latest Ref Rng & Units 10/04/2020 10/03/2020 10/02/2020  Glucose 70 - 99 mg/dL 167(H) 192(H) 182(H)  BUN 8 - 23 mg/dL 39(H) 34(H) 37(H)  Creatinine 0.61 - 1.24 mg/dL 2.35(H) 1.98(H) 1.87(H)  Sodium 135 - 145 mmol/L 134(L) 134(L) 133(L)  Potassium 3.5 - 5.1 mmol/L 4.2 4.4 4.5  Chloride 98 - 111 mmol/L 98 97(L) 97(L)  CO2 22 - 32 mmol/L '25 27 23  '$ Calcium 8.9 - 10.3 mg/dL 9.2 9.4 9.2  Total Protein 6.5 - 8.1 g/dL 7.3 - 7.5  Total Bilirubin 0.3 - 1.2 mg/dL 0.8 - 0.8  Alkaline Phos 38 - 126 U/L 85 - 77  AST 15 - 41 U/L 23 - 24  ALT 0 - 44 U/L 21 - 26     Radiology Studies: No results found.   Scheduled Meds:  buPROPion  150 mg Oral q morning   clopidogrel  75 mg Oral Daily   docusate sodium  100 mg Oral BID   DULoxetine  60 mg Oral Daily   feeding supplement (GLUCERNA SHAKE)  237 mL Oral TID BM   fluconazole  200 mg Oral Daily   influenza vaccine adjuvanted  0.5 mL Intramuscular Tomorrow-1000   insulin aspart  0-20 Units Subcutaneous TID WC   insulin detemir  20 Units Subcutaneous Q2200   isosorbide mononitrate  30 mg Oral Daily   ketorolac  1 drop Right Eye Daily   magnesium oxide  400 mg Oral BID   mouth rinse  15 mL Mouth Rinse BID   melatonin  3 mg Oral QHS   metoprolol succinate  25 mg Oral Daily   multivitamin with minerals  1 tablet Oral Daily   omega-3 acid ethyl esters  1 g Oral Daily   pantoprazole  40 mg Oral QHS   polyethylene glycol  17 g Oral Daily   rosuvastatin  20 mg Oral Q2200   senna  1 tablet Oral BID   tamsulosin  0.4 mg Oral Daily   valACYclovir  1,000 mg Oral Daily   vitamin B-12  1,000 mcg Oral Daily   Continuous Infusions:  [START ON 10/05/2020] ertapenem       LOS: 16 days    Time spent:45  Nita Sells, MD [facility] To contact the attending provider between 7A-7P or the covering provider during after hours 7P-7A, please log into the web site [email or web address] and access using universal [place] password for that web site. If you do not have the password, please call the hospital operator.  10/04/2020, 1:14 PM

## 2020-10-04 NOTE — Progress Notes (Signed)
Occupational Therapy Treatment Patient Details Name: Ricky Nelson MRN: [id number] DOB: December 31, 1935 Today's Date: 10/04/2020   History of present illness Pt is an 85 y/o male admitted to [facility] on 8/30 for L2-4 fusion with extension of hardware L2-L5. PMH significant for HTN, DM2, Bladder cancer, MI in 1995, Sleep apnea.   OT comments  Pt making significant progress towards goals given improvement with cognition. Able to mobilize OOB to chair with Min A @ RW level. Wife present for session and states pt has improved dramatically. Continue to recommend rehab at SNF given elderly wife is caregiver and pt was modified independent prior to surgery.Will continue to follow acutely.    Recommendations for follow up therapy are one component of a multi-disciplinary discharge planning process, led by the attending physician.  Recommendations may be updated based on patient status, additional functional criteria and insurance authorization.    Follow Up Recommendations  SNF    Equipment Recommendations  3 in 1 bedside commode    Recommendations for Other Services Rehab consult    Precautions / Restrictions Precautions Precautions: Back;Fall Precaution Booklet Issued: No Required Braces or Orthoses: Spinal Brace Spinal Brace: Lumbar corset;Applied in sitting position       Mobility Bed Mobility Overal bed mobility: Needs Assistance   Rolling: Supervision Sidelying to sit: Mod assist       General bed mobility comments: education on log rolling toechnique    Transfers Overall transfer level: Needs assistance   Transfers: Stand Pivot Transfers;Sit to/from Stand Sit to Stand: Min assist;+2 safety/equipment Stand pivot transfers: Min assist            Balance     Sitting balance-Leahy Scale: Good       Standing balance-Leahy Scale: Poor                             ADL either performed or assessed with clinical judgement   ADL       Grooming:  Supervision/safety;Set up;Cueing for safety   Upper Body Bathing: Set up;Sitting   Lower Body Bathing: Minimal assistance;Sit to/from stand;Sitting/lateral leans   Upper Body Dressing : Minimal assistance;Sitting;Cueing for sequencing       Toilet Transfer: Minimal assistance;Cueing for sequencing;Ambulation;BSC   Toileting- Clothing Manipulation and Hygiene: Moderate assistance;Cueing for safety;Cueing for sequencing;Sit to/from stand       Functional mobility during ADLs: Minimal assistance;+2 for safety/equipment;Rolling walker;Cueing for safety       Vision   Additional Comments: wife reports decreased vision R eye; wears glasses   Perception     Praxis      Cognition Arousal/Alertness: Awake/alert Behavior During Therapy: Flat affect Overall Cognitive Status: Impaired/Different from baseline Area of Impairment: Orientation;Memory;Safety/judgement;Awareness;Problem solving                 Orientation Level: Disoriented to;Time Current Attention Level: Selective Memory: Decreased short-term memory;Decreased recall of precautions Following Commands: Follows one step commands consistently Safety/Judgement: Decreased awareness of safety;Decreased awareness of deficits Awareness: Emergent Problem Solving: Slow processing General Comments: Improved cognition from earlier session        Exercises     Shoulder Instructions       General Comments      Pertinent Vitals/ Pain       Pain Assessment: No/denies pain  Home Living  Prior Functioning/Environment              Frequency  Min 2X/week        Progress Toward Goals  OT Goals(current goals can now be found in the care plan section)  Progress towards OT goals: Progressing toward goals  Acute Rehab OT Goals Patient Stated Goal: to get better OT Goal Formulation: With patient/family Time For Goal Achievement: 10/03/20 Potential  to Achieve Goals: Good ADL Goals Pt Will Perform Lower Body Bathing: with modified independence;sit to/from stand;with adaptive equipment Pt Will Perform Lower Body Dressing: with modified independence;with adaptive equipment;sit to/from stand Pt Will Transfer to Toilet: with modified independence;ambulating Pt Will Perform Toileting - Clothing Manipulation and hygiene: with modified independence;with adaptive equipment;sitting/lateral leans;sit to/from stand Additional ADL Goal #1: Pt/wife will independently verbalize 3 back precautions  Plan Discharge plan remains appropriate    Co-evaluation                 AM-PAC OT "6 Clicks" Daily Activity     Outcome Measure   Help from another person eating meals?: None Help from another person taking care of personal grooming?: A Little Help from another person toileting, which includes using toliet, bedpan, or urinal?: A Little Help from another person bathing (including washing, rinsing, drying)?: A Little Help from another person to put on and taking off regular upper body clothing?: A Little   6 Click Score: 16    End of Session Equipment Utilized During Treatment: Gait belt;Rolling walker;Back brace  OT Visit Diagnosis: Unsteadiness on feet (R26.81);Muscle weakness (generalized) (M62.81);Other abnormalities of gait and mobility (R26.89);Other symptoms and signs involving cognitive function   Activity Tolerance Patient tolerated treatment well   Patient Left in chair;with call bell/phone within reach;with chair alarm set;with family/visitor present (quick release posy belt alarm - pt verblaized understanding)   Nurse Communication Mobility status        Time: 1030-1059 OT Time Calculation (min): 29 min  Charges: OT General Charges $OT Visit: 1 Visit OT Treatments $Self Care/Home Management : 23-37 mins  Maurie Boettcher, OT/L   Acute OT Clinical Specialist [place] Pager [phone] Office  [phone]   [facility] 10/04/2020, 4:57 PM

## 2020-10-05 DIAGNOSIS — Z419 Encounter for procedure for purposes other than remedying health state, unspecified: Secondary | ICD-10-CM | POA: Diagnosis not present

## 2020-10-05 LAB — COMPREHENSIVE METABOLIC PANEL
ALT: 18 U/L (ref 0–44)
AST: 21 U/L (ref 15–41)
Albumin: 2.3 g/dL — ABNORMAL LOW (ref 3.5–5.0)
Alkaline Phosphatase: 82 U/L (ref 38–126)
Anion gap: 14 (ref 5–15)
BUN: 49 mg/dL — ABNORMAL HIGH (ref 8–23)
CO2: 25 mmol/L (ref 22–32)
Calcium: 9.5 mg/dL (ref 8.9–10.3)
Chloride: 92 mmol/L — ABNORMAL LOW (ref 98–111)
Creatinine, Ser: 2.71 mg/dL — ABNORMAL HIGH (ref 0.61–1.24)
GFR, Estimated: 23 mL/min — ABNORMAL LOW (ref >60)
Glucose, Bld: 194 mg/dL — ABNORMAL HIGH (ref 70–99)
Potassium: 4.5 mmol/L (ref 3.5–5.1)
Sodium: 131 mmol/L — ABNORMAL LOW (ref 135–145)
Total Bilirubin: 0.7 mg/dL (ref 0.3–1.2)
Total Protein: 6.8 g/dL (ref 6.5–8.1)

## 2020-10-05 LAB — CBC WITH DIFFERENTIAL/PLATELET
Abs Immature Granulocytes: 0.04 10*3/uL (ref 0.00–0.07)
Basophils Absolute: 0 10*3/uL (ref 0.0–0.1)
Basophils Relative: 0 %
Eosinophils Absolute: 0.3 10*3/uL (ref 0.0–0.5)
Eosinophils Relative: 3 %
HCT: 29.5 % — ABNORMAL LOW (ref 39.0–52.0)
Hemoglobin: 9.4 g/dL — ABNORMAL LOW (ref 13.0–17.0)
Immature Granulocytes: 1 %
Lymphocytes Relative: 13 %
Lymphs Abs: 1.1 10*3/uL (ref 0.7–4.0)
MCH: 29.8 pg (ref 26.0–34.0)
MCHC: 31.9 g/dL (ref 30.0–36.0)
MCV: 93.7 fL (ref 80.0–100.0)
Monocytes Absolute: 0.8 10*3/uL (ref 0.1–1.0)
Monocytes Relative: 10 %
Neutro Abs: 5.8 10*3/uL (ref 1.7–7.7)
Neutrophils Relative %: 73 %
Platelets: 217 10*3/uL (ref 150–400)
RBC: 3.15 MIL/uL — ABNORMAL LOW (ref 4.22–5.81)
RDW: 14 % (ref 11.5–15.5)
WBC: 7.9 10*3/uL (ref 4.0–10.5)
nRBC: 0 % (ref 0.0–0.2)

## 2020-10-05 LAB — GLUCOSE, CAPILLARY
Glucose-Capillary: 165 mg/dL — ABNORMAL HIGH (ref 70–99)
Glucose-Capillary: 131 mg/dL — ABNORMAL HIGH (ref 70–99)
Glucose-Capillary: 211 mg/dL — ABNORMAL HIGH (ref 70–99)
Glucose-Capillary: 213 mg/dL — ABNORMAL HIGH (ref 70–99)
Glucose-Capillary: 102 mg/dL — ABNORMAL HIGH (ref 70–99)

## 2020-10-05 MED ORDER — SODIUM CHLORIDE 0.9 % IV SOLN: INTRAVENOUS | Status: DC

## 2020-10-05 NOTE — Progress Notes (Signed)
PROGRESS NOTE   Ricky Nelson  [id number] DOB: 12-11-1935 DOA: 09/18/2020 PCP: Merryl Hacker, No  Brief Narrative:  85 year old white male known history of CAD CABG 1995, stent placement 2016, carotid endarterectomy 2004, CKD 3, HTN, COPD male erectile disorder, prior adenomatous polyps, bladder cancer status post BCG 2017, OSA on CPAP  Admit to neurosurgery after L2-L4 decompression, removal of old L4-5 rod fusion 09/18/2020-developed nosocomial new fever 09/25/2020 with tachycardia hypotension found to have Serratia bacteremia-ID saw the patient and they were subsequently reconsulted.  Hospitalization has been complicated by metabolic encephalopathy which is gradually clear  Hospital-Problem based course  Sepsis secondary to Serratia bacteremia 9/6 Requires 6 weeks total of treatment--ertapenem until safely and reliably taking p.o. 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 Continue Valtrex 1 g daily, fluconazole 200 mg daily until 9/27 then DC-has improved-appreciate ID input Toxic metabolic encephalopathy-combination polypharmacy CO2 buildup and AKI narcotics Robaxin Ativan all discontinued-B12 folate all normal Could be secondary to elevated CO2-?not been on his regular BiPAP and his usual sleep cycle [sleeps late into the morning and stays up very late ] Discontinue restraints HFpEF, EF 50-55% inferolateral hypokinesis Outpatient evaluation-Echo this admission had some mild wall motion abnormality Continue Toprol-XL 25 mg, Crestor 20, clopidogrel 75 Cut back Imdur to 15 mg given mild  hypotension Hypoxic respiratory failure on admission OSA supposed to be on nightly CPAP Imaging this admission did not confirm PE was given Lasix earlier Seems to be resolved Bladder diverticulum Needs outpatient dedicated CT or ultrasound DM TY 2 Glipizide Jardiance Victoza discontinued this admit Continue Levemir 20 and use sliding scale, CBG 110-140 Depression bupropion 150, Cymbalta 60 given-- monitor in setting of his prior encephalopathy   DVT prophylaxis: SCD Code Status: Full Family Communication: Son and discussed with wife at the bedside earlier today   Consultants:    Procedures:   Antimicrobials:     Subjective:  Sleepy again today no distress no fever no chills He is not agitated and I will try to discontinue the restraints overnight to see how he does Nurse reports that he retained about 400 cc of urine this morning   Objective: Vitals:   10/05/20 0352 10/05/20 0400 10/05/20 0735 10/05/20 1143  BP:  139/83 (!) 159/79 (!) 149/76  Pulse:  94 87 92  Resp:  '12 19 17  '$ Temp:  97.8 F (36.6 C) 97.9 F (36.6 C) 97.6 F (36.4 C)  TempSrc:  Oral Axillary Oral  SpO2:  99% 96% 99%  Weight: 88.3 kg     Height:        Intake/Output Summary (Last 24 hours) at 10/05/2020 1310 Last data filed at 10/05/2020 0845 Gross per 24 hour  Intake 80 ml  Output 400 ml  Net -320 ml    Filed Weights   10/02/20 0500 10/03/20 0450 10/05/20 0352  Weight: 93 kg 93.3 kg 88.3 kg    Examination:  Pleasant rousable but quite sleepy Mallampati 4 Chest clear no rales rhonchi Abdomen soft no rebound no guarding  S1-S2 no murmur no rub no gallop Neurologically is intact when is awake but is sleepy so exam deferred today  Data Reviewed: personally reviewed   CBC    Component Value Date/Time   WBC 7.9 10/05/2020 0123   RBC 3.15 (L) 10/05/2020 0123   HGB 9.4 (L) 10/05/2020 0123   HCT 29.5 (L) 10/05/2020 0123   PLT 217 10/05/2020 0123   MCV 93.7 10/05/2020 0123   MCH  29.8 10/05/2020 0123   MCHC 31.9 10/05/2020 0123   RDW 14.0 10/05/2020 0123   LYMPHSABS 1.1 10/05/2020 0123   MONOABS 0.8 10/05/2020 0123   EOSABS 0.3 10/05/2020 0123   BASOSABS 0.0 10/05/2020 0123   CMP Latest Ref Rng & Units 10/05/2020 10/04/2020 10/03/2020  Glucose 70 - 99 mg/dL 194(H) 167(H) 192(H)  BUN 8 - 23 mg/dL 49(H) 39(H) 34(H)  Creatinine 0.61 - 1.24 mg/dL 2.71(H) 2.35(H) 1.98(H)  Sodium 135 - 145 mmol/L 131(L) 134(L) 134(L)  Potassium 3.5 - 5.1 mmol/L 4.5 4.2 4.4  Chloride 98 - 111 mmol/L 92(L) 98 97(L)  CO2 22 - 32 mmol/L '25 25 27  '$ Calcium 8.9 - 10.3 mg/dL 9.5 9.2 9.4  Total Protein 6.5 - 8.1 g/dL 6.8 7.3 -  Total Bilirubin 0.3 - 1.2 mg/dL 0.7 0.8 -  Alkaline Phos 38 - 126 U/L 82 85 -  AST 15 - 41 U/L 21 23 -  ALT 0 - 44 U/L 18 21 -     Radiology Studies: No results found.   Scheduled Meds:  buPROPion  150 mg Oral q morning   clopidogrel  75 mg Oral Daily   docusate sodium  100 mg Oral BID   DULoxetine  60 mg Oral Daily   feeding supplement (GLUCERNA SHAKE)  237 mL Oral TID BM   fluconazole  200 mg Oral Daily   influenza vaccine adjuvanted  0.5 mL Intramuscular Tomorrow-1000   insulin aspart  0-20 Units Subcutaneous TID WC   insulin detemir  20 Units Subcutaneous Q2200   isosorbide mononitrate  15 mg Oral Daily   ketorolac  1 drop Right Eye Daily   magnesium oxide  400 mg Oral BID   mouth rinse  15 mL Mouth Rinse BID   melatonin  3 mg Oral QHS   metoprolol succinate  25 mg Oral Daily   multivitamin with minerals  1 tablet Oral Daily   omega-3 acid ethyl esters  1 g Oral Daily   pantoprazole  40 mg Oral QHS   polyethylene glycol  17 g Oral Daily   rosuvastatin  20 mg Oral Q2200   senna  1 tablet Oral BID   tamsulosin  0.4 mg Oral Daily   valACYclovir  1,000 mg Oral Daily   vitamin B-12  1,000 mcg Oral Daily   Continuous Infusions:  sodium chloride 75 mL/hr at 10/05/20 0747   ertapenem 500 mg (10/05/20 0200)     LOS: 17 days   Time spent:  [place], MD [facility] To contact the attending provider between 7A-7P or the covering provider during after hours 7P-7A, please log into the web site [email or web address] and access using universal [place] password for that web site. If you do not have the password, please call the hospital operator.  10/05/2020, 1:10 PM

## 2020-10-05 NOTE — Progress Notes (Signed)
Physical Therapy Treatment Patient Details Name: Ricky Nelson MRN: [id number] DOB: June 27, 1935 Today's Date: 10/05/2020   History of Present Illness Pt is an 85 y/o male admitted to [facility] on 8/30 for L2-4 fusion with extension of hardware L2-L5. PMH significant for HTN, DM2, Bladder cancer, MI in 1995, Sleep apnea.  Developed post op infection with tachycardia and hypotension and confusion with metabolic encephalopathy.    PT Comments    Patient progressing this session now able to walk without knees buckling and tolerating more movement with less pain.  Also more cognitively appropriate following commands and interacting well with son.  PT to continue to follow acutely with goals updated this session and continue to recommend SNF level rehab at d/c.     Recommendations for follow up therapy are one component of a multi-disciplinary discharge planning process, led by the attending physician.  Recommendations may be updated based on patient status, additional functional criteria and insurance authorization.  Follow Up Recommendations  SNF;Supervision/Assistance - 24 hour     Equipment Recommendations  Rolling walker with 5" wheels    Recommendations for Other Services       Precautions / Restrictions Precautions Precautions: Back;Fall Required Braces or Orthoses: Spinal Brace Spinal Brace: Lumbar corset;Applied in sitting position     Mobility  Bed Mobility Overal bed mobility: Needs Assistance Bed Mobility: Rolling Rolling: Min guard Sidelying to sit: Mod assist       General bed mobility comments: assist for technique    Transfers Overall transfer level: Needs assistance Equipment used: Rolling walker (2 wheeled) Transfers: Sit to/from Stand Sit to Stand: Mod assist         General transfer comment: up from slightly higher surface with help under hips for safety  Ambulation/Gait Ambulation/Gait assistance: Min assist;+2 safety/equipment Gait Distance (Feet):  20 Feet (& 30') Assistive device: Rolling walker (2 wheeled) Gait Pattern/deviations: Step-through pattern;Decreased stride length;Shuffle     General Gait Details: assist under hips for safety, but no buckling noted this session. son present and pushed chair for pt   Stairs             Wheelchair Mobility    Modified Rankin (Stroke Patients Only)       Balance Overall balance assessment: Needs assistance Sitting-balance support: Feet supported Sitting balance-Leahy Scale: Good     Standing balance support: Bilateral upper extremity supported Standing balance-Leahy Scale: Poor Standing balance comment: UE support for balance                            Cognition Arousal/Alertness: Awake/alert Behavior During Therapy: WFL for tasks assessed/performed Overall Cognitive Status: Impaired/Different from baseline Area of Impairment: Orientation;Memory;Safety/judgement;Awareness;Problem solving                 Orientation Level: Disoriented to;Time Current Attention Level: Selective Memory: Decreased short-term memory;Decreased recall of precautions Following Commands: Follows one step commands consistently;Follows one step commands with increased time Safety/Judgement: Decreased awareness of safety;Decreased awareness of deficits   Problem Solving: Slow processing        Exercises      General Comments General comments (skin integrity, edema, etc.): VSS on RA throughout SpO2 94-97%      Pertinent Vitals/Pain Pain Assessment: Faces Faces Pain Scale: Hurts little more Pain Location: back with transitional movements Pain Descriptors / Indicators: Discomfort;Grimacing Pain Intervention(s): Repositioned;Monitored during session    Home Living  Prior Function            PT Goals (current goals can now be found in the care plan section) Acute Rehab PT Goals Patient Stated Goal: to go to rehab PT Goal  Formulation: With patient/family Time For Goal Achievement: 10/19/20 Potential to Achieve Goals: Fair Progress towards PT goals: Progressing toward goals    Frequency    Min 3X/week      PT Plan Current plan remains appropriate;Frequency needs to be updated    Co-evaluation              AM-PAC PT "6 Clicks" Mobility   Outcome Measure  Help needed turning from your back to your side while in a flat bed without using bedrails?: A Little Help needed moving from lying on your back to sitting on the side of a flat bed without using bedrails?: A Lot Help needed moving to and from a bed to a chair (including a wheelchair)?: A Little Help needed standing up from a chair using your arms (e.g., wheelchair or bedside chair)?: A Little Help needed to walk in hospital room?: A Little Help needed climbing 3-5 steps with a railing? : A Lot 6 Click Score: 16    End of Session Equipment Utilized During Treatment: Back brace Activity Tolerance: Patient tolerated treatment well Patient left: in chair;with call bell/phone within reach;with family/visitor present   PT Visit Diagnosis: Other abnormalities of gait and mobility (R26.89);Muscle weakness (generalized) (M62.81);Other symptoms and signs involving the nervous system (R29.898)     Time: [id number] PT Time Calculation (min) (ACUTE ONLY): 32 min  Charges:  $Gait Training: 23-37 mins                     Magda Kiel, PT Acute Rehabilitation Services Pager:[phone] [phone] 10/05/2020    Reginia Naas 10/05/2020, 5:10 PM

## 2020-10-05 NOTE — Progress Notes (Signed)
  NEUROSURGERY PROGRESS NOTE   No issues overnight. Pt doing well this am. Does not report much pain.  EXAM:  BP (!) 159/79 (BP Location: Right Arm)   Pulse 87   Temp 97.9 F (36.6 C) (Axillary)   Resp 19   Ht '5\' 9"'$  (1.753 m)   Wt 88.3 kg   SpO2 96%   BMI 28.75 kg/m   Awake, alert, oriented  Speech fluent, somewhat confused  CN grossly intact  5/5 BUE/BLE  Wound c/d/I, no drainage  IMPRESSION:  85 y.o. male s/p lumbar fusion, metabolic encephalopathy appears to be improving.  PLAN: - Will speak with medicine team, may try medical restraints at night to be able to remove wrist restraints - Cont abx per ID - If able to remove restraints, appears to be stable enough for d/c to SNF.   Consuella Lose, MD [facility]

## 2020-10-05 NOTE — Progress Notes (Signed)
Pt refused CPAP, RN aware. RT will cont to monitor.

## 2020-10-06 DIAGNOSIS — Z419 Encounter for procedure for purposes other than remedying health state, unspecified: Secondary | ICD-10-CM | POA: Diagnosis not present

## 2020-10-06 LAB — GLUCOSE, CAPILLARY
Glucose-Capillary: 86 mg/dL (ref 70–99)
Glucose-Capillary: 115 mg/dL — ABNORMAL HIGH (ref 70–99)
Glucose-Capillary: 144 mg/dL — ABNORMAL HIGH (ref 70–99)
Glucose-Capillary: 94 mg/dL (ref 70–99)

## 2020-10-06 LAB — RENAL FUNCTION PANEL
Albumin: 2.4 g/dL — ABNORMAL LOW (ref 3.5–5.0)
Anion gap: 10 (ref 5–15)
BUN: 43 mg/dL — ABNORMAL HIGH (ref 8–23)
CO2: 27 mmol/L (ref 22–32)
Calcium: 9.5 mg/dL (ref 8.9–10.3)
Chloride: 100 mmol/L (ref 98–111)
Creatinine, Ser: 2.28 mg/dL — ABNORMAL HIGH (ref 0.61–1.24)
GFR, Estimated: 28 mL/min — ABNORMAL LOW (ref >60)
Glucose, Bld: 91 mg/dL (ref 70–99)
Phosphorus: 4.4 mg/dL (ref 2.5–4.6)
Potassium: 4.4 mmol/L (ref 3.5–5.1)
Sodium: 137 mmol/L (ref 135–145)

## 2020-10-06 MED ORDER — CIPROFLOXACIN HCL 500 MG PO TABS
500.0000 mg | ORAL_TABLET | Freq: Two times a day (BID) | ORAL | Status: DC
  Administered 2020-10-06 – 2020-10-11 (×10): 500 mg via ORAL
  Filled 2020-10-06 (×10): qty 1

## 2020-10-06 NOTE — Progress Notes (Signed)
Subjective: Patient reports doing well no complaints this morning  Objective: Vital signs in last 24 hours: Temp:  [97.6 F (36.4 C)-98.2 F (36.8 C)] 97.8 F (36.6 C) (09/17 0740) Pulse Rate:  [83-98] 84 (09/17 0740) Resp:  [16-20] 16 (09/17 0740) BP: (107-157)/(73-97) 131/89 (09/17 0740) SpO2:  [91 %-99 %] 98 % (09/17 0740) Weight:  [88.3 kg] 88.3 kg (09/17 0500)  Intake/Output from previous day: 09/16 0701 - 09/17 0700 In: 1015 [I.V.:1015] Out: 1200 [Urine:1200] Intake/Output this shift: No intake/output data recorded.  Incision clean dry and intact 5 out of 5 strength lower extremities  Lab Results: Recent Labs    10/04/20 0400 10/05/20 0123  WBC 8.1 7.9  HGB 10.1* 9.4*  HCT 32.1* 29.5*  PLT 236 217   BMET Recent Labs    10/05/20 0123 10/06/20 0308  NA 131* 137  K 4.5 4.4  CL 92* 100  CO2 25 27  GLUCOSE 194* 91  BUN 49* 43*  CREATININE 2.71* 2.28*  CALCIUM 9.5 9.5    Studies/Results: No results found.  Assessment/Plan: Status post lumbar fusion still mildly encephalopathic continue IV antibiotics awaiting placement  LOS: 18 days     Elaina Hoops 10/06/2020, 8:08 AM

## 2020-10-06 NOTE — Progress Notes (Signed)
PROGRESS NOTE   Ricky Nelson  [id number] DOB: [birth year] DOA: 09/18/2020 PCP: Merryl Hacker, No  Brief Narrative:  84 year old white male known history of CAD CABG 1995, stent placement 2016, carotid endarterectomy 2004, CKD 3, HTN, COPD male erectile disorder, prior adenomatous polyps, bladder cancer status post BCG 2017, OSA on CPAP  Admit to neurosurgery after L2-L4 decompression, removal of old L4-5 rod fusion 09/18/2020-developed nosocomial new fever 09/25/2020 with tachycardia hypotension found to have Serratia bacteremia-ID saw the patient and they were subsequently reconsulted.  Hospitalization has been complicated by metabolic encephalopathy which is gradually clear  Hospital-Problem based course  Sepsis secondary to Serratia bacteremia 9/6 Requires 6 weeks total of treatment--ertapenem -->cipro 500 bid--stop date 10/19 ID outpatient follow-up 10/27/2019 9:45 AM Periodic lab work  Acute superimposed CKD 3B, probably from acute Urinary retention  Developed urinary retention x2 this hospital stay and indwelling catheter placed Continue NS 75 cc/h and expect recovery-repeat labs in a.m. 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  Mandatory nightly BiPAP  Discontinue restraints HFpEF, EF 50-55% inferolateral hypokinesis Outpatient evaluation-Echo this admission had some mild wall motion abnormality Continue Toprol-XL 25 mg, Crestor 20, clopidogrel 75 Cut back Imdur to 15 mg given mild hypotension Hypoxic respiratory failure on admission OSA supposed to be on nightly CPAP Imaging this admission did not confirm PE was given Lasix earlier Seems to be resolved Bladder  diverticulum Needs outpatient dedicated CT or ultrasound DM TY 2 Glipizide Jardiance Victoza discontinued this admit Continue Levemir 20 and use sliding scale, CBG 80s to 120s Depression bupropion 150, Cymbalta 60 given-- monitor in setting of his prior encephalopathy   DVT prophylaxis: SCD Code Status: Full Family Communication: Discussed with wife at bedside today   Consultants:    Procedures:   Antimicrobials:     Subjective:  Cannot orient to time place person--corrects himself however No chest pain Overall his mentation has steadily improved over the past several days   Objective: Vitals:   10/06/20 0416 10/06/20 0500 10/06/20 0740 10/06/20 1100  BP: (!) 157/97  131/89 (!) 150/87  Pulse: 83  84 89  Resp:   16 18  Temp: 97.8 F (36.6 C)  97.8 F (36.6 C) 97.7 F (36.5 C)  TempSrc:    Oral  SpO2: 94%  98% 92%  Weight:  88.3 kg    Height:        Intake/Output Summary (Last 24 hours) at 10/06/2020 1133 Last data filed at 10/06/2020 1130 Gross per 24 hour  Intake 1135 ml  Output 1000 ml  Net 135 ml    Filed Weights   10/03/20 0450 10/05/20 0352 10/06/20 0500  Weight: 93.3 kg 88.3 kg 88.3 kg    Examination:  Pleasant no distress EOMI NCAT no focal deficit No icterus no pallor thick neck some bruising to her lower lip Chest clear no rales no rhonchi Abdomen soft no rebound no guarding Neurologically intact no focal deficit moving 4 limbs equally sitting out in chair pleasant euthymic and congruent   Data Reviewed: personally reviewed   CBC    Component Value Date/Time   WBC 7.9 10/05/2020 0123   RBC 3.15 (L) 10/05/2020 0123   HGB 9.4 (L) 10/05/2020 0123   HCT 29.5 (L) 10/05/2020 0123   PLT  217 10/05/2020 0123   MCV 93.7 10/05/2020 0123   MCH 29.8 10/05/2020 0123   MCHC 31.9 10/05/2020 0123   RDW 14.0 10/05/2020 0123   LYMPHSABS 1.1 10/05/2020 0123   MONOABS 0.8 10/05/2020 0123   EOSABS 0.3 10/05/2020 0123   BASOSABS 0.0 10/05/2020 0123    CMP Latest Ref Rng & Units 10/06/2020 10/05/2020 10/04/2020  Glucose 70 - 99 mg/dL 91 194(H) 167(H)  BUN 8 - 23 mg/dL 43(H) 49(H) 39(H)  Creatinine 0.61 - 1.24 mg/dL 2.28(H) 2.71(H) 2.35(H)  Sodium 135 - 145 mmol/L 137 131(L) 134(L)  Potassium 3.5 - 5.1 mmol/L 4.4 4.5 4.2  Chloride 98 - 111 mmol/L 100 92(L) 98  CO2 22 - 32 mmol/L '27 25 25  '$ Calcium 8.9 - 10.3 mg/dL 9.5 9.5 9.2  Total Protein 6.5 - 8.1 g/dL - 6.8 7.3  Total Bilirubin 0.3 - 1.2 mg/dL - 0.7 0.8  Alkaline Phos 38 - 126 U/L - 82 85  AST 15 - 41 U/L - 21 23  ALT 0 - 44 U/L - 18 21     Radiology Studies: No results found.   Scheduled Meds:  buPROPion  150 mg Oral q morning   clopidogrel  75 mg Oral Daily   docusate sodium  100 mg Oral BID   DULoxetine  60 mg Oral Daily   feeding supplement (GLUCERNA SHAKE)  237 mL Oral TID BM   fluconazole  200 mg Oral Daily   influenza vaccine adjuvanted  0.5 mL Intramuscular Tomorrow-1000   insulin aspart  0-20 Units Subcutaneous TID WC   insulin detemir  20 Units Subcutaneous Q2200   isosorbide mononitrate  15 mg Oral Daily   ketorolac  1 drop Right Eye Daily   magnesium oxide  400 mg Oral BID   mouth rinse  15 mL Mouth Rinse BID   metoprolol succinate  25 mg Oral Daily   multivitamin with minerals  1 tablet Oral Daily   omega-3 acid ethyl esters  1 g Oral Daily   pantoprazole  40 mg Oral QHS   polyethylene glycol  17 g Oral Daily   rosuvastatin  20 mg Oral Q2200   senna  1 tablet Oral BID   tamsulosin  0.4 mg Oral Daily   valACYclovir  1,000 mg Oral Daily   vitamin B-12  1,000 mcg Oral Daily   Continuous Infusions:  sodium chloride 75 mL/hr at 10/06/20 0415   ertapenem 500 mg (10/06/20 0203)     LOS: 18 days   Time spent: [place], MD [facility] To contact the attending provider between 7A-7P or the covering provider during after hours 7P-7A, please log into the web site [email or web address] and access using universal [place] password for that  web site. If you do not have the password, please call the hospital operator.  10/06/2020, 11:33 AM

## 2020-10-06 NOTE — Progress Notes (Signed)
Pt unable to wear CPAP tonight due to restraints.

## 2020-10-07 DIAGNOSIS — Z419 Encounter for procedure for purposes other than remedying health state, unspecified: Secondary | ICD-10-CM

## 2020-10-07 LAB — GLUCOSE, CAPILLARY
Glucose-Capillary: 88 mg/dL (ref 70–99)
Glucose-Capillary: 137 mg/dL — ABNORMAL HIGH (ref 70–99)
Glucose-Capillary: 172 mg/dL — ABNORMAL HIGH (ref 70–99)
Glucose-Capillary: 153 mg/dL — ABNORMAL HIGH (ref 70–99)

## 2020-10-07 LAB — BASIC METABOLIC PANEL
Anion gap: 9 (ref 5–15)
BUN: 38 mg/dL — ABNORMAL HIGH (ref 8–23)
CO2: 27 mmol/L (ref 22–32)
Calcium: 9.3 mg/dL (ref 8.9–10.3)
Chloride: 100 mmol/L (ref 98–111)
Creatinine, Ser: 2.09 mg/dL — ABNORMAL HIGH (ref 0.61–1.24)
GFR, Estimated: 31 mL/min — ABNORMAL LOW (ref >60)
Glucose, Bld: 90 mg/dL (ref 70–99)
Potassium: 4.1 mmol/L (ref 3.5–5.1)
Sodium: 136 mmol/L (ref 135–145)

## 2020-10-07 MED ORDER — HEPARIN SODIUM (PORCINE) 5000 UNIT/ML IJ SOLN
5000.0000 [IU] | Freq: Three times a day (TID) | INTRAMUSCULAR | Status: DC
  Administered 2020-10-07 – 2020-10-11 (×12): 5000 [IU] via SUBCUTANEOUS
  Filled 2020-10-07 (×12): qty 1

## 2020-10-07 NOTE — Progress Notes (Signed)
  NEUROSURGERY PROGRESS NOTE   No issues overnight. Pt remains confused but without complaint this am.  EXAM:  BP (!) 160/84 (BP Location: Left Arm)   Pulse 73   Temp 97.7 F (36.5 C) (Oral)   Resp 18   Ht '5\' 9"'$  (1.753 m)   Wt 88.3 kg   SpO2 96%   BMI 28.75 kg/m   Awake, alert Speech fluent, confused  CN grossly intact  5/5 BUE/BLE  Wound without erythema or drainage  IMPRESSION:  85 y.o. male s/p lumbar fusion, delirium appears to be improving  Previous serratia bacteremia HSV stomatitis Cutaneous candidal infection  PLAN: - Cont PT/OT - Will need to keep restraints off for placement - Cont abx  Consuella Lose, MD [facility]

## 2020-10-07 NOTE — Final Consult Note (Signed)
Recommendations at d/c  Have spoken with patient's son as patient was sleeping today but he remains improved from last several days and I suspect that this is all hospital-acquired delirium that was multifactorial-  I had a long discussion with the son about expectations in anticipation of him going to rehab once he can be free of all restraints--this has been discussed with nursing and I have discontinued the restraints several days ago  I have updated the [facility] with the recommendations below  I also spoke with Dr. Kathyrn Sheriff about the case  I will follow peripherally in a.m. 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   Mandatory nightly BiPAP  Discontinue restraints  Cardio-resp HFpEF, EF 50-55% inferolateral hypokinesis Outpatient evaluation-Echo this admission had some mild wall motion abnormality Continue Toprol-XL 25 mg, Crestor 20, clopidogrel 75 Cut back Imdur to 15 mg given mild hypotension See MAR Hypoxic respiratory failure on admission OSA supposed to be on nightly CPAP Imaging this admission did not confirm PE was given Lasix earlier Seems to be  resolved  FEN DM TY 2 Glipizide Jardiance d/c-- resume on d/c Victoza  Continue Levemir 20 and use sliding scale, CBG 80s to 120s  35 min Verneita Griffes, MD [facility] 3:28 PM

## 2020-10-08 LAB — RENAL FUNCTION PANEL
Albumin: 2.4 g/dL — ABNORMAL LOW (ref 3.5–5.0)
Anion gap: 11 (ref 5–15)
BUN: 32 mg/dL — ABNORMAL HIGH (ref 8–23)
CO2: 25 mmol/L (ref 22–32)
Calcium: 9 mg/dL (ref 8.9–10.3)
Chloride: 101 mmol/L (ref 98–111)
Creatinine, Ser: 1.99 mg/dL — ABNORMAL HIGH (ref 0.61–1.24)
GFR, Estimated: 33 mL/min — ABNORMAL LOW (ref >60)
Glucose, Bld: 140 mg/dL — ABNORMAL HIGH (ref 70–99)
Phosphorus: 3.1 mg/dL (ref 2.5–4.6)
Potassium: 4.6 mmol/L (ref 3.5–5.1)
Sodium: 137 mmol/L (ref 135–145)

## 2020-10-08 LAB — GLUCOSE, CAPILLARY
Glucose-Capillary: 139 mg/dL — ABNORMAL HIGH (ref 70–99)
Glucose-Capillary: 150 mg/dL — ABNORMAL HIGH (ref 70–99)
Glucose-Capillary: 143 mg/dL — ABNORMAL HIGH (ref 70–99)
Glucose-Capillary: 207 mg/dL — ABNORMAL HIGH (ref 70–99)

## 2020-10-08 NOTE — Progress Notes (Signed)
Note reviewed  Appears stable medically for d/c to Rehab  We will not follow--thanks for consult  No charge  Verneita Griffes, MD [facility] 10:42 AM

## 2020-10-08 NOTE — Progress Notes (Signed)
Physical Therapy Treatment Patient Details Name: Ricky Nelson MRN: [id number] DOB: February 06, 1935 Today's Date: 10/08/2020   History of Present Illness Pt is an 85 y/o male admitted to [facility] on 8/30 for L2-4 fusion with extension of hardware L2-L5. PMH significant for HTN, DM2, Bladder cancer, MI in 1995, Sleep apnea.  Developed post op infection with tachycardia and hypotension and confusion with metabolic encephalopathy.    PT Comments    Pt up in recliner asleep upon PT arrival to room. Pt ambulated <5 ft x3 during session, limited by fatigue, feeling like LEs were going to "give out" on him, dizziness, and back pain. Pt tolerated repeated sit to stands in between short gait bouts. PT to continue to follow.     Recommendations for follow up therapy are one component of a multi-disciplinary discharge planning process, led by the attending physician.  Recommendations may be updated based on patient status, additional functional criteria and insurance authorization.  Follow Up Recommendations  SNF;Supervision/Assistance - 24 hour     Equipment Recommendations  Rolling walker with 5" wheels    Recommendations for Other Services       Precautions / Restrictions Precautions Precautions: Fall;Back Precaution Booklet Issued: No Required Braces or Orthoses: Spinal Brace Spinal Brace: Lumbar corset;Applied in sitting position Restrictions Weight Bearing Restrictions: No     Mobility  Bed Mobility Overal bed mobility: Needs Assistance Bed Mobility: Sit to Sidelying;Rolling Rolling: Min assist       Sit to sidelying: Mod assist General bed mobility comments: min-mod assist for return to supine utilizing log roll technique, mostly for LE management and boost up in bed.    Transfers Overall transfer level: Needs assistance Equipment used: Rolling walker (2 wheeled) Transfers: Sit to/from Stand Sit to Stand: Min assist         General transfer comment: min assist for power up,  steadying upon standing. Cuing for safe hand placement with each stand, x3 during session.  Ambulation/Gait Ambulation/Gait assistance: Min assist Gait Distance (Feet): 4 Feet (+3+3) Assistive device: Rolling walker (2 wheeled) Gait Pattern/deviations: Step-through pattern;Decreased stride length;Shuffle Gait velocity: decr   General Gait Details: assist to steady, pt limited to short distances next to bed today due to reported dizziness and feeling like legs were going to "give" on him.   Stairs             Wheelchair Mobility    Modified Rankin (Stroke Patients Only)       Balance Overall balance assessment: Needs assistance Sitting-balance support: Feet supported Sitting balance-Leahy Scale: Good     Standing balance support: Bilateral upper extremity supported Standing balance-Leahy Scale: Poor Standing balance comment: UE support for balance                            Cognition Arousal/Alertness: Awake/alert Behavior During Therapy: WFL for tasks assessed/performed Overall Cognitive Status: Impaired/Different from baseline Area of Impairment: Orientation;Memory;Safety/judgement;Awareness;Problem solving                   Current Attention Level: Selective Memory: Decreased short-term memory;Decreased recall of precautions Following Commands: Follows one step commands with increased time Safety/Judgement: Decreased awareness of safety;Decreased awareness of deficits Awareness: Emergent Problem Solving: Slow processing;Requires verbal cues;Requires tactile cues General Comments: Pt states name, location (with increased time), and reason for admission. Pt makes several unrelated comments during session, at one point talking about looking for a bear. pt requires safety cues throughout mobility, at  times attempts to sit without being close to sitting surface.      Exercises General Exercises - Lower Extremity Short Arc QuadSinclair Ship;Both;10  reps;Seated    General Comments        Pertinent Vitals/Pain Pain Assessment: Faces Faces Pain Scale: Hurts even more Pain Location: back Pain Descriptors / Indicators: Discomfort;Grimacing Pain Intervention(s): Limited activity within patient's tolerance;Monitored during session;Repositioned    Home Living                      Prior Function            PT Goals (current goals can now be found in the care plan section) Acute Rehab PT Goals Patient Stated Goal: to go to rehab PT Goal Formulation: With patient/family Time For Goal Achievement: 10/19/20 Potential to Achieve Goals: Fair Progress towards PT goals: Progressing toward goals    Frequency    Min 3X/week      PT Plan Current plan remains appropriate    Co-evaluation              AM-PAC PT "6 Clicks" Mobility   Outcome Measure  Help needed turning from your back to your side while in a flat bed without using bedrails?: A Little Help needed moving from lying on your back to sitting on the side of a flat bed without using bedrails?: A Lot Help needed moving to and from a bed to a chair (including a wheelchair)?: A Little Help needed standing up from a chair using your arms (e.g., wheelchair or bedside chair)?: A Little Help needed to walk in hospital room?: A Little Help needed climbing 3-5 steps with a railing? : A Lot 6 Click Score: 16    End of Session Equipment Utilized During Treatment: Back brace;Gait belt Activity Tolerance: Patient tolerated treatment well Patient left: with call bell/phone within reach;with family/visitor present;with bed alarm set;in bed Nurse Communication: Mobility status PT Visit Diagnosis: Other abnormalities of gait and mobility (R26.89);Muscle weakness (generalized) (M62.81);Other symptoms and signs involving the nervous system (R29.898)     Time: [id number] PT Time Calculation (min) (ACUTE ONLY): 19 min  Charges:  $Gait Training: 8-22 mins                      Stacie Glaze, PT DPT Acute Rehabilitation Services Pager [phone]  Office [phone]    [place] E Ruffin Pyo 10/08/2020, 1:10 PM

## 2020-10-08 NOTE — Progress Notes (Signed)
  NEUROSURGERY PROGRESS NOTE   No issues overnight. Pt appears well, sitting in bedside chair. More lucid this am. Mild back soreness otherwise no complaints.  EXAM:  BP 138/84 (BP Location: Right Arm)   Pulse (!) 58   Temp 98.1 F (36.7 C) (Oral)   Resp 18   Ht '5\' 9"'$  (1.753 m)   Wt 88.3 kg   SpO2 97%   BMI 28.75 kg/m   Awake, alert, oriented  Speech fluent, appropriate  CN grossly intact  5/5 BUE/BLE   IMPRESSION:  85 y.o. male s/p lumbar fusion. Multifactorial toxic/metabolic encephalopathy appears to be resolving. Cleared serratia bacteremia HSV/fungal stomatitis Urinary retention   PLAN: - Can look at placement - likely SNF - Cont to mobilize - Cont abx - cipro, valtrex, diflucan   Consuella Lose, MD [facility]

## 2020-10-08 NOTE — Progress Notes (Signed)
Patient confused. Alert and oriented X3. Refused CPAP at this time.

## 2020-10-09 LAB — GLUCOSE, CAPILLARY
Glucose-Capillary: 67 mg/dL — ABNORMAL LOW (ref 70–99)
Glucose-Capillary: 75 mg/dL (ref 70–99)
Glucose-Capillary: 106 mg/dL — ABNORMAL HIGH (ref 70–99)
Glucose-Capillary: 150 mg/dL — ABNORMAL HIGH (ref 70–99)
Glucose-Capillary: 146 mg/dL — ABNORMAL HIGH (ref 70–99)
Glucose-Capillary: 164 mg/dL — ABNORMAL HIGH (ref 70–99)
Glucose-Capillary: 196 mg/dL — ABNORMAL HIGH (ref 70–99)

## 2020-10-09 NOTE — Progress Notes (Signed)
Occupational Therapy Treatment Patient Details Name: Ricky Nelson MRN: [id number] DOB: 1935/08/02 Today's Date: 10/09/2020   History of present illness 85 y/o male admitted to [facility] on 8/30 for L2-4 fusion with extension of hardware L2-L5.  Developed post op infection with tachycardia and hypotension and confusion with metabolic encephalopathy. PMH significant for HTN, DM2, Bladder cancer, MI in 1995, Sleep apnea.   OT comments  Pt with progress towards goals this session and with increased orientation, recall, and activity tolerance than in previous sessions. Pt required Max A for for physical assist and sequencing while donning lumbar brace and reeducated on correct positioning. Pt performed functional mobility simulating home distance mobility in hallway with Min guard. Pt requiring mod-max verbal and tactile cues for correct sequencing and safety during functional mobility.Continuing to recommend SNF rehab to optimize safety and improve ADL performance post d/c. Will continue to follow in the acute setting.   Recommendations for follow up therapy are one component of a multi-disciplinary discharge planning process, led by the attending physician.  Recommendations may be updated based on patient status, additional functional criteria and insurance authorization.    Follow Up Recommendations  SNF    Equipment Recommendations  3 in 1 bedside commode    Recommendations for Other Services Rehab consult    Precautions / Restrictions Precautions Precautions: Fall;Back Precaution Booklet Issued: No Required Braces or Orthoses: Spinal Brace Spinal Brace: Lumbar corset;Applied in sitting position Restrictions Weight Bearing Restrictions: No       Mobility Bed Mobility Overal bed mobility: Needs Assistance Bed Mobility: Rolling;Sidelying to Sit Rolling: Min assist Sidelying to sit: Mod assist       General bed mobility comments: Required verbal and tactile cues to sequence log-roll,  physical assist to push up into sitting.    Transfers Overall transfer level: Needs assistance Equipment used: Rolling walker (2 wheeled) Transfers: Sit to/from Stand Sit to Stand: Min guard Stand pivot transfers: Min guard       General transfer comment: Min guard for and safety and sequencing stand-pivot to recliner and sit to stand    Balance Overall balance assessment: Needs assistance Sitting-balance support: Feet supported Sitting balance-Leahy Scale: Good Sitting balance - Comments: sat EOB unsupported with no LOBs   Standing balance support: Bilateral upper extremity supported Standing balance-Leahy Scale: Poor Standing balance comment: UE support for balance. pt with one LOB that he self corrected.                           ADL either performed or assessed with clinical judgement   ADL Overall ADL's : Needs assistance/impaired                         Toilet Transfer: Min guard;Cueing for sequencing;Cueing for safety (Simulated with bed) Toilet Transfer Details (indicate cue type and reason): Pt required Min guard for safety with RW and verbal cues for sequencing sit<>stand         Functional mobility during ADLs: Min guard;Rolling walker General ADL Comments: Pt required Max A for donning lumbar brace for placing correctly, adjusting initial wrap, and adjusting one velcro strap to correct area.     Vision   Additional Comments: Wears glasses   Perception     Praxis      Cognition Arousal/Alertness: Awake/alert Behavior During Therapy: WFL for tasks assessed/performed Overall Cognitive Status: Impaired/Different from baseline Area of Impairment: Memory;Following commands;Safety/judgement;Awareness;Problem solving  Orientation Level: Place;Time Current Attention Level: Selective Memory: Decreased short-term memory;Decreased recall of precautions Following Commands: Follows one step commands  inconsistently;Follows one step commands with increased time Safety/Judgement: Decreased awareness of safety;Decreased awareness of deficits Awareness: Emergent Problem Solving: Slow processing;Difficulty sequencing;Requires verbal cues;Requires tactile cues General Comments: Pt pleasant and cheerful during session. Pt needing mod verbal and tactile cues for sequencing for log rolling, donning lumbar brace, and sit<>stands. Pt requires safety cues throughout session for correct use of RW.        Exercises     Shoulder Instructions       General Comments  Son present during session.    Pertinent Vitals/ Pain       Pain Assessment: No/denies pain  Home Living Family/patient expects to be discharged to:: Skilled nursing facility Living Arrangements: Spouse/significant other                                      Prior Functioning/Environment              Frequency  Min 2X/week        Progress Toward Goals  OT Goals(current goals can now be found in the care plan section)  Progress towards OT goals: Progressing toward goals  Acute Rehab OT Goals Patient Stated Goal: to go to rehab OT Goal Formulation: With patient/family Time For Goal Achievement: 10/03/20 Potential to Achieve Goals: Good ADL Goals Pt Will Perform Lower Body Bathing: with modified independence;sit to/from stand;with adaptive equipment Pt Will Perform Lower Body Dressing: with modified independence;with adaptive equipment;sit to/from stand Pt Will Transfer to Toilet: with modified independence;ambulating Pt Will Perform Toileting - Clothing Manipulation and hygiene: with modified independence;with adaptive equipment;sitting/lateral leans;sit to/from stand Additional ADL Goal #1: Pt/wife will independently verbalize 3 back precautions  Plan Discharge plan remains appropriate    Co-evaluation                 AM-PAC OT "6 Clicks" Daily Activity     Outcome Measure   Help from  another person eating meals?: None Help from another person taking care of personal grooming?: A Little Help from another person toileting, which includes using toliet, bedpan, or urinal?: A Little Help from another person bathing (including washing, rinsing, drying)?: A Little Help from another person to put on and taking off regular upper body clothing?: A Little Help from another person to put on and taking off regular lower body clothing?: A Lot 6 Click Score: 18    End of Session Equipment Utilized During Treatment: Gait belt;Rolling walker;Back brace  OT Visit Diagnosis: Unsteadiness on feet (R26.81);Muscle weakness (generalized) (M62.81);Other abnormalities of gait and mobility (R26.89);Other symptoms and signs involving cognitive function   Activity Tolerance Patient tolerated treatment well   Patient Left in chair;with call bell/phone within reach;with chair alarm set;with family/visitor present   Nurse Communication Mobility status        Time: 9528-4132 OT Time Calculation (min): 24 min  Charges: OT General Charges $OT Visit: 1 Visit OT Treatments $Self Care/Home Management : 23-37 mins  Jackquline Denmark, OTS Acute Rehab Office: [phone]   Reine Bristow 10/09/2020, 5:08 PM

## 2020-10-09 NOTE — Progress Notes (Signed)
  NEUROSURGERY PROGRESS NOTE   No issues overnight. No complaints this am.  EXAM:  BP 139/81 (BP Location: Right Arm)   Pulse 77   Temp 98.4 F (36.9 C) (Oral)   Resp 20   Ht 5\' 9"  (1.753 m)   Wt 88.3 kg   SpO2 95%   BMI 28.75 kg/m   Awake, alert, oriented  Speech fluent, appropriate  CN grossly intact  5/5 BUE/BLE   IMPRESSION:  85 y.o. male s/p lumbar fusion. Encephalopathy improved. Cleared serratia bacteremia HSV/fungal stomatitis Urinary retention   PLAN: - Awaiting SNF placement - Cont to mobilize - Cont abx - cipro, valtrex, diflucan   Consuella Lose, MD [facility]

## 2020-10-09 NOTE — TOC Progression Note (Signed)
Transition of Care [facility]) - Progression Note    Patient Details  Name: WAI LITT MRN: [id number] Date of Birth: Aug 20, 1935  Transition of Care [facility]) CM/SW [place], [place] Phone Number: 10/09/2020, 10:38 AM  Clinical Narrative:     Patient had bed at [place] contacted [facility]-  they currently does not have any availability - they will update CSW if bed come available later today.  CSW called patient's spouse - advised needs another SNF choice- [facility] no longer has availability. She states she will call and discuss with her son and call CSW back. CSW called patient's son,Darion, left voice message and requested to return call.  CSW will continue to follow and assist with discharge planning.   Thurmond Butts, MSW, LCSW Clinical Social Worker    Expected Discharge Plan: Skilled Nursing Facility Barriers to Discharge: Continued Medical Work up  Expected Discharge Plan and Services Expected Discharge Plan: [place] In-house Referral: Clinical Social Work   Post Acute Care Choice: [place] Living arrangements for the past 2 months: Single Family Home                 DME Arranged: N/A DME Agency: NA       HH Arranged: NA HH Agency: NA         Social Determinants of Health (SDOH) Interventions    Readmission Risk Interventions Readmission Risk Prevention Plan 10/04/2020 10/04/2020  Transportation Screening - Complete  PCP or Specialist Appt within 5-7 Days - Not Complete  Not Complete comments plan for SNF going to [place] - Complete  Medication Review (RN CM) - Complete  Some recent data might be hidden

## 2020-10-09 NOTE — Progress Notes (Signed)
Inpatient Diabetes Program Recommendations  AACE/ADA: New Consensus Statement on Inpatient Glycemic Control (2015)  Target Ranges:  Prepandial:   less than 140 mg/dL      Peak postprandial:   less than 180 mg/dL (1-2 hours)      Critically ill patients:  140 - 180 mg/dL   Lab Results  Component Value Date   GLUCAP 150 (H) 10/09/2020   HGBA1C 8.4 (H) 09/12/2020    Review of Glycemic Control Results for ALEXIUS, HANGARTNER (MRN [id number]) as of 10/09/2020 12:16  Ref. Range 10/09/2020 07:40 10/09/2020 07:58 10/09/2020 08:38 10/09/2020 11:13  Glucose-Capillary Latest Ref Range: 70 - 99 mg/dL 67 (L) 75 106 (H) 150 (H)   Diabetes history: Type 2 DM Outpatient Diabetes medications: Glipizide 20 mg BID, Levemir 35 units QD, Januvia 100 mg QD, Victoza 1.8 mg QD Current orders for Inpatient glycemic control: Levemir 20 units QHS, Novolog 0-20 units TID  Inpatient Diabetes Program Recommendations:    Noted mild hypoglycemia of 67 mg/dL. May want to consider decreasing Levemir to 18 units QHS and correction to Novolog 0-15 units TID.  Thanks, Bronson Curb, MSN, RNC-OB Diabetes Coordinator [phone] (8a-5p)

## 2020-10-10 LAB — GLUCOSE, CAPILLARY
Glucose-Capillary: 77 mg/dL (ref 70–99)
Glucose-Capillary: 128 mg/dL — ABNORMAL HIGH (ref 70–99)
Glucose-Capillary: 157 mg/dL — ABNORMAL HIGH (ref 70–99)
Glucose-Capillary: 168 mg/dL — ABNORMAL HIGH (ref 70–99)

## 2020-10-10 LAB — CREATININE, SERUM
Creatinine, Ser: 1.97 mg/dL — ABNORMAL HIGH (ref 0.61–1.24)
GFR, Estimated: 33 mL/min — ABNORMAL LOW (ref >60)

## 2020-10-10 LAB — SARS CORONAVIRUS 2 (TAT 6-24 HRS): SARS Coronavirus 2: NEGATIVE

## 2020-10-10 NOTE — TOC Progression Note (Addendum)
Transition of Care [facility]) - Progression Note    Patient Details  Name: DURREL MCNEE MRN: [id number] Date of Birth: [birth year]  Transition of Care [facility]) CM/SW [place], [place] Phone Number: 10/10/2020, 11:45 AM  Clinical Narrative:     Received call from [facility]- they are unable to accept patient. Sent message to [place]- they states they could admit tomorrow - informed patient has been refusing cpap while here but does a cpap at home that family can bring to SNF.  CSW called, left voice message with patient's son,[name], [facility] unable to met the patient's needs. If medically stable patient can d/c to [facility].  Informed RN covid test requested  Thurmond Butts, MSW, LCSW Clinical Social Worker    Expected Discharge Plan: Skilled Nursing Facility Barriers to Discharge: Continued Medical Work up  Expected Discharge Plan and Services Expected Discharge Plan: [place] In-house Referral: Clinical Social Work   Post Acute Care Choice: [place] Living arrangements for the past 2 months: Single Family Home                 DME Arranged: N/A DME Agency: NA       HH Arranged: NA HH Agency: NA         Social Determinants of Health (SDOH) Interventions    Readmission Risk Interventions Readmission Risk Prevention Plan 10/04/2020 10/04/2020  Transportation Screening - Complete  PCP or Specialist Appt within 5-7 Days - Not Complete  Not Complete comments plan for SNF going to [place] - Complete  Medication Review (RN CM) - Complete  Some recent data might be hidden

## 2020-10-10 NOTE — Progress Notes (Signed)
  NEUROSURGERY PROGRESS NOTE   No issues overnight. No complaints today. Pt seen while ambulating in hallway with PT.  EXAM:  BP 119/69 (BP Location: Right Arm)   Pulse 80   Temp 98 F (36.7 C) (Oral)   Resp 14   Ht 5\' 9"  (1.753 m)   Wt 88.3 kg   SpO2 99%   BMI 28.75 kg/m   Awake, alert, oriented  Speech fluent, appropriate  CN grossly intact  5/5 BUE/BLE   IMPRESSION:  85 y.o. male s/p lumbar fusion. Encephalopathy improved. Cleared serratia bacteremia HSV/fungal stomatitis Urinary retention   PLAN: - Medically stable for SNF placement - hopefully tx to [facility] tomorrow - Cont to mobilize - Cont abx - cipro, valtrex, diflucan   Consuella Lose, MD [facility]

## 2020-10-10 NOTE — Progress Notes (Signed)
Physical Therapy Treatment Patient Details Name: Ricky Nelson MRN: [id number] DOB: Dec 10, 1935 Today's Date: 10/10/2020   History of Present Illness 85 y/o male admitted to [facility] on 8/30 for L2-4 fusion with extension of hardware L2-L5.  Developed post op infection with tachycardia and hypotension and confusion with metabolic encephalopathy. PMH significant for HTN, DM2, Bladder cancer, MI in 1995, Sleep apnea.    PT Comments    Patient progressing towards physical therapy goals. Patient increased ambulation distance this date. Patient ambulated 50' x 2 with RW and minA + chair follow. No instances of knee buckling this session. Patient continues to be limited by impaired cognition and decreased activity tolerance. Continue to recommend SNF for ongoing Physical Therapy.       Recommendations for follow up therapy are one component of a multi-disciplinary discharge planning process, led by the attending physician.  Recommendations may be updated based on patient status, additional functional criteria and insurance authorization.  Follow Up Recommendations  SNF;Supervision/Assistance - 24 hour     Equipment Recommendations  Rolling [place] with 5" wheels    Recommendations for Other Services       Precautions / Restrictions Precautions Precautions: Fall;Back Precaution Booklet Issued: No Precaution Comments: Reviewed precautions verbally Required Braces or Orthoses: Spinal Brace Spinal Brace: Lumbar corset;Applied in sitting position Restrictions Weight Bearing Restrictions: No     Mobility  Bed Mobility Overal bed mobility: Needs Assistance Bed Mobility: Rolling;Sit to Sidelying Rolling: Supervision       Sit to sidelying: Supervision General bed mobility comments: able to complete log roll technique with no cues or assist    Transfers Overall transfer level: Needs assistance Equipment used: Rolling Zorah Backes (2 wheeled) Transfers: Sit to/from Stand Sit to Stand: Min  guard         General transfer comment: min guard for safety. Cues for hand placement each attempt. Sit to stand x 4 during session  Ambulation/Gait Ambulation/Gait assistance: Min assist Gait Distance (Feet): 50 Feet (x50') Assistive device: Rolling Kaulana Brindle (2 wheeled) Gait Pattern/deviations: Step-through pattern;Decreased stride length;Shuffle Gait velocity: decr   General Gait Details: minA for balance and close chair follow provided by son. Cues for maintaining close proximity with RW and upright posture. Seated rest break between each bout   Stairs             Wheelchair Mobility    Modified Rankin (Stroke Patients Only)       Balance Overall balance assessment: Needs assistance Sitting-balance support: Feet supported Sitting balance-Leahy Scale: Good     Standing balance support: Bilateral upper extremity supported Standing balance-Leahy Scale: Poor Standing balance comment: reliant on UE support and external assist                            Cognition Arousal/Alertness: Awake/alert Behavior During Therapy: WFL for tasks assessed/performed Overall Cognitive Status: Impaired/Different from baseline Area of Impairment: Memory;Following commands;Safety/judgement;Awareness;Problem solving;Orientation                 Orientation Level: Disoriented to;Place Current Attention Level: Selective Memory: Decreased short-term memory;Decreased recall of precautions Following Commands: Follows one step commands inconsistently;Follows one step commands with increased time Safety/Judgement: Decreased awareness of safety;Decreased awareness of deficits Awareness: Emergent Problem Solving: Slow processing;Difficulty sequencing;Requires verbal cues;Requires tactile cues        Exercises      General Comments        Pertinent Vitals/Pain Pain Assessment: Faces Faces Pain Scale: Hurts a  little bit Pain Location: back Pain Descriptors /  Indicators: Discomfort;Grimacing Pain Intervention(s): Monitored during session    Home Living                      Prior Function            PT Goals (current goals can now be found in the care plan section) Acute Rehab PT Goals Patient Stated Goal: to go to rehab PT Goal Formulation: With patient/family Time For Goal Achievement: 10/19/20 Potential to Achieve Goals: Fair Progress towards PT goals: Progressing toward goals    Frequency    Min 3X/week      PT Plan Current plan remains appropriate    Co-evaluation              AM-PAC PT "6 Clicks" Mobility   Outcome Measure  Help needed turning from your back to your side while in a flat bed without using bedrails?: A Little Help needed moving from lying on your back to sitting on the side of a flat bed without using bedrails?: A Little Help needed moving to and from a bed to a chair (including a wheelchair)?: A Little Help needed standing up from a chair using your arms (e.g., wheelchair or bedside chair)?: A Little Help needed to walk in hospital room?: A Little Help needed climbing 3-5 steps with a railing? : A Lot 6 Click Score: 17    End of Session Equipment Utilized During Treatment: Back brace;Gait belt Activity Tolerance: Patient tolerated treatment well Patient left: in bed;with call bell/phone within reach;with bed alarm set;with family/visitor present Nurse Communication: Mobility status PT Visit Diagnosis: Other abnormalities of gait and mobility (R26.89);Muscle weakness (generalized) (M62.81);Other symptoms and signs involving the nervous system (R29.898)     Time: 1771-1657 PT Time Calculation (min) (ACUTE ONLY): 28 min  Charges:  $Gait Training: 23-37 mins                     Kayci [place] A. Gilford Rile PT, DPT Acute Rehabilitation Services Pager [phone] Office [phone]    Linna Hoff 10/10/2020, 5:19 PM

## 2020-10-10 NOTE — TOC Progression Note (Signed)
Transition of Care [facility]) - Progression Note    Patient Details  Name: Ricky Nelson MRN: [id number] Date of Birth: 1935/02/23  Transition of Care [facility]) CM/SW [place], [place] Phone Number: 10/10/2020, 11:38 AM  Clinical Narrative:     CSW met with patient and his son,[name]. CSW informed of bed offers. CSW explained [facility] no longer had availability and so in the meantime we should pursue another SNF. He decided on [facility].  CSW called [facility] spoke with Antony Salmon- they will review and call CSW back.  Thurmond Butts, MSW, LCSW Clinical Social Worker    Expected Discharge Plan: Skilled Nursing Facility Barriers to Discharge: Continued Medical Work up  Expected Discharge Plan and Services Expected Discharge Plan: [place] In-house Referral: Clinical Social Work   Post Acute Care Choice: [place] Living arrangements for the past 2 months: Single Family Home                 DME Arranged: N/A DME Agency: NA       HH Arranged: NA HH Agency: NA         Social Determinants of Health (SDOH) Interventions    Readmission Risk Interventions Readmission Risk Prevention Plan 10/04/2020 10/04/2020  Transportation Screening - Complete  PCP or Specialist Appt within 5-7 Days - Not Complete  Not Complete comments plan for SNF going to [place] - Complete  Medication Review (RN CM) - Complete  Some recent data might be hidden

## 2020-10-10 NOTE — Progress Notes (Signed)
Patient declined CPAP use at this time.

## 2020-10-11 LAB — GLUCOSE, CAPILLARY
Glucose-Capillary: 95 mg/dL (ref 70–99)
Glucose-Capillary: 171 mg/dL — ABNORMAL HIGH (ref 70–99)
Glucose-Capillary: 175 mg/dL — ABNORMAL HIGH (ref 70–99)

## 2020-10-11 MED ORDER — FLUCONAZOLE 200 MG PO TABS: 200.0000 mg | ORAL_TABLET | Freq: Every day | ORAL | 0 refills | Status: AC

## 2020-10-11 MED ORDER — CIPROFLOXACIN HCL 500 MG PO TABS: 500.0000 mg | ORAL_TABLET | Freq: Two times a day (BID) | ORAL | 1 refills | Status: AC

## 2020-10-11 MED ORDER — METOPROLOL SUCCINATE ER 25 MG PO TB24: 50.0000 mg | ORAL_TABLET | Freq: Every day | ORAL | Status: AC

## 2020-10-11 MED ORDER — INSULIN DETEMIR 100 UNIT/ML ~~LOC~~ SOLN: 20.0000 [IU] | Freq: Every day | SUBCUTANEOUS | 11 refills | Status: AC

## 2020-10-11 MED ORDER — VALACYCLOVIR HCL 1 G PO TABS: 1000.0000 mg | ORAL_TABLET | Freq: Every day | ORAL | 0 refills | Status: AC

## 2020-10-11 MED ORDER — PANTOPRAZOLE SODIUM 40 MG PO TBEC: 40.0000 mg | DELAYED_RELEASE_TABLET | Freq: Every day | ORAL | Status: AC

## 2020-10-11 MED ORDER — TAMSULOSIN HCL 0.4 MG PO CAPS: 0.4000 mg | ORAL_CAPSULE | Freq: Every day | ORAL | 0 refills | Status: AC

## 2020-10-11 NOTE — TOC Transition Note (Signed)
Transition of Care [facility]) - CM/SW Discharge Note   Patient Details  Name: Ricky Nelson MRN: [id number] Date of Birth: November 06, 1935  Transition of Care [facility]) CM/SW Contact:  Vinie Sill, LCSW Phone Number: 10/11/2020, 11:13 AM   Clinical Narrative:     Patient will Discharge to: [facility] Discharge Date: 10/11/2020 Family Notified: spouse & son Transport By: Corey Harold  Per MD patient is ready for discharge. RN, patient, and facility notified of discharge. Discharge Summary sent to facility. RN given number for [phone]. Ambulance transport requested for patient.   Clinical Social Worker signing off.  Thurmond Butts, MSW, LCSW Clinical Social Worker    Final next level of care: Skilled Nursing Facility Barriers to Discharge: Barriers Resolved   Patient Goals and CMS Choice Patient states their goals for this hospitalization and ongoing recovery are:: rehab CMS Medicare.gov Compare Post Acute Care list provided to:: Patient Represenative (must comment) Choice offered to / list presented to : Spouse  Discharge Placement              Patient [facility] bed at: [facility] Patient to be transferred to facility by: [place] Name of family member notified: Darion son & spouse Patient and family notified of of transfer: 10/11/20  Discharge Plan and Services In-house Referral: Clinical Social Work   Post Acute Care Choice: [place]          DME Arranged: N/A DME Agency: NA       HH Arranged: NA HH Agency: NA        Social Determinants of Health ([place]) Interventions     Readmission Risk Interventions Readmission Risk Prevention Plan 10/04/2020 10/04/2020  Transportation Screening - Complete  PCP or Specialist Appt within 5-7 Days - Not Complete  Not Complete comments plan for SNF going to [place] - Complete  Medication Review (RN CM) - Complete  Some recent data might be hidden

## 2020-10-11 NOTE — Discharge Summary (Signed)
Physician Discharge Summary  Patient ID: Ricky Nelson MRN: [id number] DOB/AGE: Jul 31, 1935 85 y.o.  Admit date: 09/18/2020 Discharge date: 10/11/2020  Admission Diagnoses:  Lumbar spinal stenosis Lumbar spondylosis  Discharge Diagnoses:  Same Active Problems:   Lumbar spinal stenosis   Sleep apnea   Diabetes mellitus without complication (HCC)   Coronary artery disease   CKD (chronic kidney disease)   SIRS (systemic inflammatory response syndrome) (HCC)   Hypoxia   Pleural effusion   Delirium   Serratia septicemia (HCC)   Herpes stomatitis   Candidal stomatitis   Diaper rash   Heat rash   S/P lumbar spine operation   Fixation hardware in spine   Surgery, elective   Discharged Condition: Stable  Hospital Course:  Ricky Nelson is a 85 y.o. male initially admitted after elective two-level lumbar spinal fusion surgery.  Patient initially did well for the first few days after surgery with some improvement in his back and leg pain.  Unfortunately he became obtunded with a fever and leukocytosis.  Work-up included blood cultures which grew Serratia marcescens.  He was started on IV antibiotics.  He was also noted to have increase in his baseline creatinine level consistent with acute kidney injury.  Although his leukocytosis resolved and his bacteremia cleared, he remained encephalopathic for several days.  His medication and analgesic regimen was altered, and he slowly became more lucid.  We were able to discontinue his restraints at night.  He continued to improve from a mental status standpoint.  He was able to again begin working with physical therapy and became ambulatory with a rolling walker.  Unfortunately he continued to have some urinary retention with placement of a Foley catheter.  He was seen by infectious diseases when he was diagnosed with bacteremia.  He was also found to develop HSV stomatitis and some candidal rash.  He was started on antivirals as well as  antifungal.  Treatments: Surgery - lumbar fusion L2-3, L3-4  Discharge Exam: Blood pressure (!) 141/81, pulse 100, temperature 98.6 F (37 C), temperature source Oral, resp. rate 14, height 5\' 9"  (1.753 m), weight 88.3 kg, SpO2 98 %. Awake, alert, oriented Speech fluent, appropriate CN grossly intact 5/5 BUE/BLE Wound c/d/i  Disposition: Discharge disposition: 03-Skilled Nursing Facility      Discharge Instructions     Call MD for:  redness, tenderness, or signs of infection (pain, swelling, redness, odor or green/yellow discharge around incision site)   Complete by: As directed    Call MD for:  temperature >100.4   Complete by: As directed    Change dressing (specify)   Complete by: As directed    Dressing change: 1 times per day using honeycomb dressing or dry gauze with paper tape.   Diet - low sodium heart healthy   Complete by: As directed    Discharge instructions   Complete by: As directed    Walk at home as much as possible, at least 4 times / day   Increase activity slowly   Complete by: As directed    Lifting restrictions   Complete by: As directed    No lifting > 10 lbs   May shower / Bathe   Complete by: As directed    48 hours after surgery   May walk up steps   Complete by: As directed    Other Restrictions   Complete by: As directed    No bending/twisting at waist      Allergies as of 10/11/2020  Reactions   Allergy Relief [chlorpheniramine Maleate] Shortness Of Breath   Diazepam    Other reaction(s): Hypotension, Low blood pressure, Other (See Comments)   Diphenhydramine Hcl Shortness Of Breath   Other reaction(s): Other (See Comments), Stopped breathing Other reaction(s): Shortness Of Breath Shortness of breath with benadryl   Diphenhydramine-zinc Acetate Shortness Of Breath   Atorvastatin Other (See Comments)   Other reaction(s): Muscle pain, Other (See Comments), Unknown Elevated CK   Fluvastatin Other (See Comments)   Other  reaction(s): Other (See Comments) myalgias   Lisinopril    Other reaction(s): Hyperkalemia, Other (See Comments)   Metformin And Related    Other reaction(s): Other Renal function   Piperacillin-tazobactam In Dex Rash   Other reaction(s): Rash Other reaction(s): Rash   Diphenhydramine    Other reaction(s): Low blood pressure   Other Other (See Comments)   Lescol        Medication List     STOP taking these medications    DSS 100 MG Caps   fluticasone 50 MCG/ACT nasal spray Commonly known as: FLONASE   gabapentin 300 MG capsule Commonly known as: NEURONTIN   glipiZIDE 10 MG tablet Commonly known as: GLUCOTROL   Jardiance 10 MG Tabs tablet Generic drug: empagliflozin   THERA-TABS PO   triamcinolone cream 0.1 % Commonly known as: KENALOG       TAKE these medications    buPROPion 150 MG 24 hr tablet Commonly known as: WELLBUTRIN XL Take 150 mg by mouth every morning.   ciprofloxacin 500 MG tablet Commonly known as: CIPRO Take 1 tablet (500 mg total) by mouth 2 (two) times daily.   clopidogrel 75 MG tablet Commonly known as: PLAVIX Take 75 mg by mouth daily.   Combivent Respimat 20-100 MCG/ACT Aers respimat Generic drug: Ipratropium-Albuterol Inhale 2 puffs into the lungs every 6 (six) hours.   DULoxetine 60 MG capsule Commonly known as: CYMBALTA Take 60 mg by mouth daily.   fluconazole 200 MG tablet Commonly known as: DIFLUCAN Take 1 tablet (200 mg total) by mouth daily for 8 days.   insulin detemir 100 UNIT/ML injection Commonly known as: LEVEMIR Inject 0.2 mLs (20 Units total) into the skin daily. What changed: how much to take   isosorbide mononitrate 30 MG 24 hr tablet Commonly known as: IMDUR Take 30 mg by mouth daily.   ketorolac 0.5 % ophthalmic solution Commonly known as: ACULAR Place 1 drop into the left eye daily.   liraglutide 18 MG/3ML Sopn Commonly known as: VICTOZA Inject 1.8 mg into the skin at bedtime.   magnesium  oxide 400 MG tablet Commonly known as: MAG-OX Take 400 mg by mouth 2 (two) times daily.   metoprolol succinate 25 MG 24 hr tablet Commonly known as: TOPROL-XL Take 2 tablets (50 mg total) by mouth daily. Take with or immediately following a meal. What changed: medication strength   nitroGLYCERIN 0.4 MG SL tablet Commonly known as: NITROSTAT Place 0.4 mg under the tongue every 5 (five) minutes as needed for chest pain.   omega-3 acid ethyl esters 1 g capsule Commonly known as: LOVAZA Take 1 g by mouth daily.   pantoprazole 40 MG tablet Commonly known as: PROTONIX Take 1 tablet (40 mg total) by mouth at bedtime.   rosuvastatin 20 MG tablet Commonly known as: CRESTOR Take 20 mg by mouth daily.   tamsulosin 0.4 MG Caps capsule Commonly known as: FLOMAX Take 1 capsule (0.4 mg total) by mouth daily.   valACYclovir 1000 MG tablet  Commonly known as: VALTREX Take 1 tablet (1,000 mg total) by mouth daily for 8 days.   vitamin B-12 1000 MCG tablet Commonly known as: CYANOCOBALAMIN Take 1,000 mcg by mouth daily.               Discharge Care Instructions  (From admission, onward)           Start     Ordered   10/11/20 0000  Change dressing (specify)       Comments: Dressing change: 1 times per day using honeycomb dressing or dry gauze with paper tape.   10/11/20 1007            Follow-up Information     Consuella Lose, MD Follow up in 2 week(s).   Specialty: Neurosurgery Contact information: [address] [place] [address] [phone]         Jabier Mutton, MD Follow up on 10/26/2020.   Specialty: Infectious Diseases Contact information: [address] [place] [address] [phone]         [facility]. Schedule an appointment as soon as possible for a visit.   Contact information: [place]                 Signed: Jairo Ben 10/11/2020, 10:15 AM

## 2020-10-11 NOTE — Progress Notes (Signed)
  NEUROSURGERY PROGRESS NOTE   No issues overnight. No complaints today, sitting in bedside chair.  EXAM:  BP (!) 141/81 (BP Location: Right Arm)   Pulse 100   Temp 98.6 F (37 C) (Oral)   Resp 14   Ht 5\' 9"  (1.753 m)   Wt 88.3 kg   SpO2 98%   BMI 28.75 kg/m   Awake, alert, oriented  Speech fluent, appropriate  CN grossly intact  5/5 BUE/BLE   IMPRESSION:  85 y.o. male s/p lumbar fusion. Encephalopathy improved. Cleared serratia bacteremia HSV/fungal stomatitis Urinary retention   PLAN: - Medically stable for SNF placement - tx to [facility] today - Cont to mobilize - Cont abx - cipro, valtrex, diflucan per ID   Consuella Lose, MD [facility]

## 2020-10-14 ENCOUNTER — Telehealth: Payer: Self-pay

## 2020-10-17 ENCOUNTER — Emergency Department (HOSPITAL_COMMUNITY)
Admission: EM | Admit: 2020-10-17 | Discharge: 2020-10-18 | Disposition: A | Discharge status: Home / Self Care | Payer: Medicare Other | Attending: Emergency Medicine | Admitting: Emergency Medicine

## 2020-10-17 ENCOUNTER — Emergency Department (HOSPITAL_COMMUNITY): Payer: Medicare Other

## 2020-10-17 DIAGNOSIS — S0003XA Contusion of scalp, initial encounter: Secondary | ICD-10-CM | POA: Diagnosis not present

## 2020-10-17 DIAGNOSIS — N189 Chronic kidney disease, unspecified: Secondary | ICD-10-CM | POA: Insufficient documentation

## 2020-10-17 DIAGNOSIS — W01198A Fall on same level from slipping, tripping and stumbling with subsequent striking against other object, initial encounter: Secondary | ICD-10-CM | POA: Insufficient documentation

## 2020-10-17 DIAGNOSIS — I129 Hypertensive chronic kidney disease with stage 1 through stage 4 chronic kidney disease, or unspecified chronic kidney disease: Secondary | ICD-10-CM | POA: Diagnosis not present

## 2020-10-17 DIAGNOSIS — Z79899 Other long term (current) drug therapy: Secondary | ICD-10-CM | POA: Diagnosis not present

## 2020-10-17 DIAGNOSIS — Z794 Long term (current) use of insulin: Secondary | ICD-10-CM | POA: Insufficient documentation

## 2020-10-17 DIAGNOSIS — E1122 Type 2 diabetes mellitus with diabetic chronic kidney disease: Secondary | ICD-10-CM | POA: Insufficient documentation

## 2020-10-17 DIAGNOSIS — N289 Disorder of kidney and ureter, unspecified: Secondary | ICD-10-CM

## 2020-10-17 DIAGNOSIS — W19XXXA Unspecified fall, initial encounter: Secondary | ICD-10-CM

## 2020-10-17 DIAGNOSIS — I2510 Atherosclerotic heart disease of native coronary artery without angina pectoris: Secondary | ICD-10-CM | POA: Insufficient documentation

## 2020-10-17 DIAGNOSIS — S0990XA Unspecified injury of head, initial encounter: Secondary | ICD-10-CM | POA: Diagnosis present

## 2020-10-17 LAB — CBC WITH DIFFERENTIAL/PLATELET
Abs Immature Granulocytes: 0.03 10*3/uL (ref 0.00–0.07)
Basophils Absolute: 0 10*3/uL (ref 0.0–0.1)
Basophils Relative: 1 %
Eosinophils Absolute: 0.1 10*3/uL (ref 0.0–0.5)
Eosinophils Relative: 3 %
HCT: 30.4 % — ABNORMAL LOW (ref 39.0–52.0)
Hemoglobin: 9.5 g/dL — ABNORMAL LOW (ref 13.0–17.0)
Immature Granulocytes: 1 %
Lymphocytes Relative: 19 %
Lymphs Abs: 0.8 10*3/uL (ref 0.7–4.0)
MCH: 30.5 pg (ref 26.0–34.0)
MCHC: 31.3 g/dL (ref 30.0–36.0)
MCV: 97.7 fL (ref 80.0–100.0)
Monocytes Absolute: 0.6 10*3/uL (ref 0.1–1.0)
Monocytes Relative: 15 %
Neutro Abs: 2.6 10*3/uL (ref 1.7–7.7)
Neutrophils Relative %: 61 %
Platelets: 170 10*3/uL (ref 150–400)
RBC: 3.11 MIL/uL — ABNORMAL LOW (ref 4.22–5.81)
RDW: 15.3 % (ref 11.5–15.5)
WBC: 4.1 10*3/uL (ref 4.0–10.5)
nRBC: 0 % (ref 0.0–0.2)

## 2020-10-17 LAB — BASIC METABOLIC PANEL
Anion gap: 11 (ref 5–15)
BUN: 35 mg/dL — ABNORMAL HIGH (ref 8–23)
CO2: 22 mmol/L (ref 22–32)
Calcium: 9.3 mg/dL (ref 8.9–10.3)
Chloride: 100 mmol/L (ref 98–111)
Creatinine, Ser: 2.28 mg/dL — ABNORMAL HIGH (ref 0.61–1.24)
GFR, Estimated: 28 mL/min — ABNORMAL LOW (ref >60)
Glucose, Bld: 216 mg/dL — ABNORMAL HIGH (ref 70–99)
Potassium: 4.2 mmol/L (ref 3.5–5.1)
Sodium: 133 mmol/L — ABNORMAL LOW (ref 135–145)

## 2020-10-17 LAB — CBG MONITORING, ED: Glucose-Capillary: 200 mg/dL — ABNORMAL HIGH (ref 70–99)

## 2020-10-17 MED ORDER — SODIUM CHLORIDE 0.9 % IV BOLUS
500.0000 mL | Freq: Once | INTRAVENOUS | Status: AC
  Administered 2020-10-17: 500 mL via INTRAVENOUS

## 2020-10-17 NOTE — Discharge Instructions (Addendum)
Your CT did not show any bleeding and your x-ray did not show any fracture today.  Your kidney function is slightly abnormal likely from dehydration.  Stay hydrated  Recheck kidney function with your doctor in a week.  Follow-up with your doctor   Return to ER if you have another fall, severe headache, lethargy.

## 2020-10-17 NOTE — ED Notes (Signed)
The pt starts talking and it is not understandable  the son reports that he has been that way for 24 days after his back surgery

## 2020-10-17 NOTE — ED Notes (Signed)
Pts son remains at  the bedside pts condition unchanged  altered intermittently

## 2020-10-17 NOTE — ED Notes (Signed)
Pt to c-t and back to trauma room a  he remains alert no distress

## 2020-10-17 NOTE — ED Notes (Signed)
The pt is falling asleep while we ask him questions.  He denies taking any pain med at  the nursing home.  Very drowsy.

## 2020-10-17 NOTE — ED Notes (Signed)
Report given to aniqutah at [facility]   ptar transporting.  Son remains at  the bedside

## 2020-10-17 NOTE — ED Notes (Signed)
Trauma Response Nurse Note-  Reason for Call / Reason for Trauma activation:   - Level 2 trauma, fall on thinners  Initial Focused Assessment (If applicable, or please see trauma documentation):  - Pt came in no c-collar. EMS states pt was ambulatory at scene. Abrasion with swelling noted to the occipital area of the head. No airway obstruction noted.   Interventions:  - As per EDP, no x-rays, just basic labs and CT head and c-spine. IV placed by TRN.   Plan of Care as of this note:  - Waiting on results of blood work and imaging results. Son of pt just came to bedside.  Primary RN at bedside at bedside at 844pm  Event Summary:   - Pt came in as a trauma, fall with head trauma. While in CT pt noted to be drowsy and in the middle of talking would stop. Pt would wake back up to physical stimuli or loud voice. Pt continued this back in room. EDP was notified and came to bedside. Primary RN just informed this TRN at 8:46pm that as per Son, this is patients behavior for the last 20+ days.  Pt is on cardiac, bp and pulse ox monitor.

## 2020-10-17 NOTE — ED Notes (Signed)
Pt taken to CT with TRN

## 2020-10-17 NOTE — Progress Notes (Signed)
Orthopedic Tech Progress Note Patient Details:  EAN GETTEL Dec 19, 1935 [id number]  Patient ID: Dub Amis, male   DOB: 10-04-1935, 85 y.o.   MRN: [id number] Level II trauma; Ortho tech not needed.  Vernona Rieger 10/17/2020, 8:17 PM

## 2020-10-17 NOTE — ED Notes (Signed)
PTAR CALLED NO LIST SO HE SHOULD BE GETTING PICKED UP SHORTLY.

## 2020-10-17 NOTE — ED Provider Notes (Signed)
[facility] EMERGENCY DEPARTMENT Provider Note   CSN: [id number] Arrival date & time: 10/17/20  2002     History No chief complaint on file.   SUN WILENSKY is a 85 y.o. male history of CKD, diabetes, here presenting with fall.  Patient is from a nursing home.  Patient apparently had a mechanical fall and fell backwards and hit his head.  Patient is on Plavix a level 2 trauma was activated.  Patient apparently was ambulatory afterwards.  Patient has no complaints.  Patient in particular had no chest pain or shortness of breath.  The history is provided by the patient.      Past Medical History:  Diagnosis Date   Anemia    Anginal pain ([place])    2018; chronic stable angina 06/2020 (Dr. Atilano Median)   Cancer [facility])    bladder   CKD (chronic kidney disease)    Coronary artery disease    Diabetes mellitus without complication ([place])    type 2   Hypertension    Myocardial infarction ([place]) 1995   Pneumonia 1995   Sleep apnea 2012    Patient Active Problem List   Diagnosis Date Noted   Surgery, elective    Serratia septicemia ([place])    Herpes stomatitis    Candidal stomatitis    Diaper rash    Heat rash    S/P lumbar spine operation    Fixation hardware in spine    Delirium    Hypoxia    Pleural effusion    Diabetes mellitus without complication ([facility])    Coronary artery disease    CKD (chronic kidney disease)    SIRS (systemic inflammatory response syndrome) ([facility])    Lumbar spinal stenosis 09/18/2020   Sleep apnea 2012    Past Surgical History:  Procedure Laterality Date   BACK SURGERY  2012   CAROTID ENDARTERECTOMY Right    right CEA ~ 2004, Dr. Heinz Knuckles, [place]  2012   [place] Bilateral 2018   cataracts, since removed       No family history on file.  Social History   Tobacco Use   Smoking status: Never   Smokeless tobacco: Never  Vaping Use   Vaping Use: Never  used  Substance Use Topics   Alcohol use: Not Currently   Drug use: Never    Home Medications Prior to Admission medications   Medication Sig Start Date End Date Taking? Authorizing Provider  buPROPion (WELLBUTRIN XL) 150 MG 24 hr tablet Take 150 mg by mouth every morning. 09/06/20   [provider]  ciprofloxacin (CIPRO) 500 MG tablet Take 1 tablet (500 mg total) by mouth 2 (two) times daily. 10/11/20 11/11/20  Consuella Lose, MD  clopidogrel (PLAVIX) 75 MG tablet Take 75 mg by mouth daily. 08/23/20   [provider]  DULoxetine (CYMBALTA) 60 MG capsule Take 60 mg by mouth daily. 09/06/20   [provider]  fluconazole (DIFLUCAN) 200 MG tablet Take 1 tablet (200 mg total) by mouth daily for 8 days. 10/11/20 10/19/20  Consuella Lose, MD  insulin detemir (LEVEMIR) 100 UNIT/ML injection Inject 0.2 mLs (20 Units total) into the skin daily. 10/11/20   Consuella Lose, MD  Ipratropium-Albuterol (COMBIVENT RESPIMAT) 20-100 MCG/ACT AERS respimat Inhale 2 puffs into the lungs every 6 (six) hours.    [provider]  isosorbide mononitrate (IMDUR) 30 MG 24 hr tablet Take 30 mg by  mouth daily.    [provider]  ketorolac (ACULAR) 0.5 % ophthalmic solution Place 1 drop into the left eye daily. 08/13/20   [provider]  liraglutide (VICTOZA) 18 MG/3ML SOPN Inject 1.8 mg into the skin at bedtime.    [provider]  magnesium oxide (MAG-OX) 400 MG tablet Take 400 mg by mouth 2 (two) times daily.    [provider]  metoprolol succinate (TOPROL-XL) 25 MG 24 hr tablet Take 2 tablets (50 mg total) by mouth daily. Take with or immediately following a meal. 10/11/20   Consuella Lose, MD  nitroGLYCERIN (NITROSTAT) 0.4 MG SL tablet Place 0.4 mg under the tongue every 5 (five) minutes as needed for chest pain.    [provider]  omega-3 acid ethyl esters (LOVAZA) 1 g capsule Take 1 g by mouth daily.    [provider]  pantoprazole (PROTONIX) 40 MG tablet Take 1 tablet (40 mg total) by mouth at bedtime. 10/11/20   Consuella Lose, MD  rosuvastatin (CRESTOR) 20 MG tablet Take 20 mg by mouth daily.    [provider]  tamsulosin (FLOMAX) 0.4 MG CAPS capsule Take 1 capsule (0.4 mg total) by mouth daily. 10/11/20   Consuella Lose, MD  valACYclovir (VALTREX) 1000 MG tablet Take 1 tablet (1,000 mg total) by mouth daily for 8 days. 10/11/20 10/19/20  Consuella Lose, MD  vitamin B-12 (CYANOCOBALAMIN) 1000 MCG tablet Take 1,000 mcg by mouth daily.    [provider]    Allergies    Allergy relief [chlorpheniramine maleate], Diazepam, Diphenhydramine hcl, Diphenhydramine-zinc acetate, Atorvastatin, Fluvastatin, Lisinopril, Metformin and related, Piperacillin-tazobactam in dex, Diphenhydramine, and Other  Review of Systems   Review of Systems  Neurological:  Negative for dizziness and weakness.  All other systems reviewed and are negative.  Physical Exam Updated Vital Signs BP (!) 147/80   Pulse (!) 101   Temp (!) 97.2 F (36.2 C)   Resp 17   Ht 5\' 9"  (1.753 m)   Wt 95.3 kg   SpO2 98%   BMI 31.01 kg/m   Physical Exam Vitals and nursing note reviewed.  Constitutional:      Comments: Demented  HENT:     Head: Normocephalic.     Comments: + scalp hematoma     Nose: Nose normal.     Mouth/Throat:     Mouth: Mucous membranes are moist.  Eyes:     Extraocular Movements: Extraocular movements intact.     Pupils: Pupils are equal, round, and reactive to light.  Cardiovascular:     Rate and Rhythm: Normal rate and regular rhythm.     Pulses: Normal pulses.     Heart sounds: Normal heart sounds.  Pulmonary:     Effort: Pulmonary effort is normal.     Breath sounds: Normal breath sounds.  Abdominal:     General: Abdomen is flat.     Palpations: Abdomen is soft.  Musculoskeletal:        General: Normal range of motion.     Cervical back: Normal range of motion and neck  supple.  Skin:    General: Skin is warm.     Capillary Refill: Capillary refill takes less than 2 seconds.  Neurological:     General: No focal deficit present.     Comments: Demented and confused.  Patient moves bilateral extremities but has hard time following commands.  Psychiatric:        Mood and Affect: Mood normal.  Behavior: Behavior normal.    ED Results / Procedures / Treatments   Labs (all labs ordered are listed, but only abnormal results are displayed) Labs Reviewed  CBC WITH DIFFERENTIAL/PLATELET - Abnormal; Notable for the following components:      Result Value   RBC 3.11 (*)    Hemoglobin 9.5 (*)    HCT 30.4 (*)    All other components within normal limits  BASIC METABOLIC PANEL - Abnormal; Notable for the following components:   Sodium 133 (*)    Glucose, Bld 216 (*)    BUN 35 (*)    Creatinine, Ser 2.28 (*)    GFR, Estimated 28 (*)    All other components within normal limits  CBG MONITORING, ED - Abnormal; Notable for the following components:   Glucose-Capillary 200 (*)    All other components within normal limits    EKG None  Radiology DG Chest 1 View  Result Date: 10/17/2020 CLINICAL DATA:  Fall, chest injury EXAM: CHEST  1 VIEW COMPARISON:  09/30/2020 FINDINGS: Lungs are clear. No pneumothorax or pleural effusion. Coronary artery bypass grafting has been performed. Cardiac size within normal limits. Pulmonary vascularity is normal. No acute bone abnormality. IMPRESSION: No active disease. Electronically Signed   By: Fidela Salisbury M.D.   On: 10/17/2020 22:25   CT HEAD WO CONTRAST (5MM)  Result Date: 10/17/2020 CLINICAL DATA:  Neck trauma, facial trauma EXAM: CT HEAD WITHOUT CONTRAST CT CERVICAL SPINE WITHOUT CONTRAST TECHNIQUE: Multidetector CT imaging of the head and cervical spine was performed following the standard protocol without intravenous contrast. Multiplanar CT image reconstructions of the cervical spine were also generated.  COMPARISON:  None. FINDINGS: Brain: No evidence of acute infarction, hemorrhage, extra-axial collection, ventriculomegaly, or mass effect. Generalized cerebral atrophy. Periventricular white matter low attenuation likely secondary to microangiopathy. Vascular: Cerebrovascular atherosclerotic calcifications are noted. No hyperdense vessels. Skull: Negative for fracture or focal lesion. Sinuses/Orbits: Visualized portions of the orbits are unremarkable. Visualized portions of the paranasal sinuses are unremarkable. Visualized portions of the mastoid air cells are unremarkable. Other: None. CT CERVICAL SPINE FINDINGS Alignment: 4.5 mm anterolisthesis of C4 on C5 secondary to facet disease. Skull base and vertebrae: No acute fracture. No primary bone lesion or focal pathologic process. Soft tissues and spinal canal: No prevertebral fluid or swelling. No visible canal hematoma. Disc levels: Degenerative disease with disc height loss at C3-4 with ankylosis across the disc space and ankylosis of the right posterior elements. Degenerative disease with disc height loss at C4-5, C5-6, C6-7, C7-T1 and T1-2. At C2-3 there is moderate left facet arthropathy. At C3-4 there is severe right and moderate left foraminal stenosis. At C4-5 there is moderate bilateral facet arthropathy and bilateral foraminal stenosis. At C5-6 there is a broad-based disc bulge, bilateral facet arthropathy, bilateral uncovertebral degenerative changes, and bilateral foraminal stenosis. At C6-7 there is a broad-based disc osteophyte complex with bilateral uncovertebral degenerative changes and bilateral foraminal stenosis. Upper chest: Lung apices are clear. Other: Bilateral carotid artery atherosclerosis. IMPRESSION: 1. No acute intracranial pathology. 2.  No acute osseous injury of the cervical spine. 3. Cervical spine spondylosis as described above. Electronically Signed   By: Kathreen Devoid M.D.   On: 10/17/2020 20:34   CT Cervical Spine Wo  Contrast  Result Date: 10/17/2020 CLINICAL DATA:  Neck trauma, facial trauma EXAM: CT HEAD WITHOUT CONTRAST CT CERVICAL SPINE WITHOUT CONTRAST TECHNIQUE: Multidetector CT imaging of the head and cervical spine was performed following the standard protocol without intravenous  contrast. Multiplanar CT image reconstructions of the cervical spine were also generated. COMPARISON:  None. FINDINGS: Brain: No evidence of acute infarction, hemorrhage, extra-axial collection, ventriculomegaly, or mass effect. Generalized cerebral atrophy. Periventricular white matter low attenuation likely secondary to microangiopathy. Vascular: Cerebrovascular atherosclerotic calcifications are noted. No hyperdense vessels. Skull: Negative for fracture or focal lesion. Sinuses/Orbits: Visualized portions of the orbits are unremarkable. Visualized portions of the paranasal sinuses are unremarkable. Visualized portions of the mastoid air cells are unremarkable. Other: None. CT CERVICAL SPINE FINDINGS Alignment: 4.5 mm anterolisthesis of C4 on C5 secondary to facet disease. Skull base and vertebrae: No acute fracture. No primary bone lesion or focal pathologic process. Soft tissues and spinal canal: No prevertebral fluid or swelling. No visible canal hematoma. Disc levels: Degenerative disease with disc height loss at C3-4 with ankylosis across the disc space and ankylosis of the right posterior elements. Degenerative disease with disc height loss at C4-5, C5-6, C6-7, C7-T1 and T1-2. At C2-3 there is moderate left facet arthropathy. At C3-4 there is severe right and moderate left foraminal stenosis. At C4-5 there is moderate bilateral facet arthropathy and bilateral foraminal stenosis. At C5-6 there is a broad-based disc bulge, bilateral facet arthropathy, bilateral uncovertebral degenerative changes, and bilateral foraminal stenosis. At C6-7 there is a broad-based disc osteophyte complex with bilateral uncovertebral degenerative changes and  bilateral foraminal stenosis. Upper chest: Lung apices are clear. Other: Bilateral carotid artery atherosclerosis. IMPRESSION: 1. No acute intracranial pathology. 2.  No acute osseous injury of the cervical spine. 3. Cervical spine spondylosis as described above. Electronically Signed   By: Kathreen Devoid M.D.   On: 10/17/2020 20:34   DG Hip Unilat W or Wo Pelvis 2-3 Views Right  Result Date: 10/17/2020 CLINICAL DATA:  Fall, pelvic injury EXAM: DG HIP (WITH OR WITHOUT PELVIS) 2-3V RIGHT COMPARISON:  None. FINDINGS: Normal alignment. No fracture or dislocation. Hip joint spaces are preserved. Periarticular sclerosis involving the left sacroiliac joint is likely degenerative in nature. Lumbar fusion hardware is partially visualized. Penile prosthesis is in place. Vascular calcifications are noted within the medial thighs. IMPRESSION: No acute fracture or dislocation. Electronically Signed   By: Fidela Salisbury M.D.   On: 10/17/2020 22:27    Procedures Procedures   Medications Ordered in ED Medications  sodium chloride 0.9 % bolus 500 mL (500 mLs Intravenous New Bag/Given 10/17/20 2234)    ED Course  I have reviewed the triage vital signs and the nursing notes.  Pertinent labs & imaging results that were available during my care of the patient were reviewed by me and considered in my medical decision making (see chart for details).    MDM Rules/Calculators/A&P                          JAEL KOSTICK is a 85 y.o. male here presenting with fall.  Patient has a posterior scalp hematoma.  Patient is on Plavix a level 2 trauma was activated.  We will get CT head and neck.  We will get basic lab work.  10:45 PM Labs show mild AKI and given IV fluids.  CT head and x-rays unremarkable.  Stable for discharge.    Final Clinical Impression(s) / ED Diagnoses Final diagnoses:  None    Rx / DC Orders ED Discharge Orders     None        Drenda Freeze, MD 10/17/20 2246

## 2020-10-17 NOTE — Progress Notes (Signed)
   10/17/20 2005  Clinical Encounter Type  Visited With Patient not available  Visit Type Trauma  Referral From Nurse  Consult/Referral To Chaplain   Chaplain received page for fall on thinners. Chaplain remains available for follow-up spiritual/emotional support as needed. This note was prepared by Jeanine Luz, M.Div..  For questions please contact by phone [phone].

## 2020-10-18 ENCOUNTER — Other Ambulatory Visit: Payer: Self-pay

## 2020-10-18 ENCOUNTER — Encounter (HOSPITAL_COMMUNITY): Payer: Self-pay | Admitting: *Deleted

## 2020-10-18 NOTE — ED Triage Notes (Signed)
The pt transferred from [facility] home with a fal on thinner alert altered normal accoring to the son when he arrived pain in his low back recent back surgery

## 2020-10-26 ENCOUNTER — Encounter (HOSPITAL_COMMUNITY): Payer: Self-pay | Admitting: Radiology

## 2020-10-26 ENCOUNTER — Ambulatory Visit (INDEPENDENT_AMBULATORY_CARE_PROVIDER_SITE_OTHER): Payer: Medicare Other | Admitting: Internal Medicine

## 2020-10-26 ENCOUNTER — Other Ambulatory Visit: Payer: Self-pay

## 2020-10-26 VITALS — BP 106/71 | HR 88

## 2020-10-26 DIAGNOSIS — A498 Other bacterial infections of unspecified site: Secondary | ICD-10-CM | POA: Diagnosis present

## 2020-10-26 DIAGNOSIS — Z9889 Other specified postprocedural states: Secondary | ICD-10-CM | POA: Diagnosis not present

## 2020-10-26 NOTE — Patient Instructions (Signed)
Provided with snf instruction sheet   F/u with me in around 3 weeks Monitor off antibiotics  Blood tests today and see more about abx decision

## 2020-10-26 NOTE — Progress Notes (Signed)
[facility]  Patient Active Problem List   Diagnosis Date Noted   Surgery, elective    Serratia septicemia ([place])    Herpes stomatitis    Candidal stomatitis    Diaper rash    Heat rash    S/P lumbar spine operation    Fixation hardware in spine    Delirium    Hypoxia    Pleural effusion    Diabetes mellitus without complication (HCC)    Coronary artery disease    CKD (chronic kidney disease)    SIRS (systemic inflammatory response syndrome) (HCC)    Lumbar spinal stenosis 09/18/2020   Sleep apnea 2012      Subjective:    Patient ID: Ricky Nelson, male    DOB: 28-May-1935, 85 y.o.   MRN: [id number]  Chief Complaint  Patient presents with   New Patient (Initial Visit)    HSFU; doesn't report improvement of pain since surgery;   Per ID inpatient note, patient supposed to be on Cipro until 10/19 however paperwork provided from the facility indicates thepatient is not on any antibiotics; the orders they received was for cipro 500mg  x8 days (9/22 - 9/30); he was treated for PNA with azithro x1 dose on 9/30 but has not received any additional abx. Provider notified.     HPI:  Ricky Nelson is a 85 y.o. male here for follow up of serratia bacteremia in the setting of same admission for semielective lumbar instrumentation  Admitted 8/30; hardware exchanged Hospital course complicated by nosocomial serratia bacteremia Also had clinical hsv stomatitis  Finish ertapenem 9/14-9/22 (was on cefepime 9/7-14). My plan was to treat 6 weeks concerning for the surgical site involvement (although there was never any gross evidence of soft tissue infection of the surgical site)  However, he only got 8 more days of cipro until 9/30 after his discharge to snf  He reports stable lower back pain Stable bilateral weakness --- doing physical therapy Will see his neurosurgeon next week  No fever, chill, n, v, diarrhea Appetite not the best, but  eating adequately per his report  Oral rash resolved    Allergies  Allergen Reactions   Allergy Relief [Chlorpheniramine Maleate] Shortness Of Breath   Diazepam     Other reaction(s): Hypotension, Low blood pressure, Other (See Comments)   Diphenhydramine Hcl Shortness Of Breath    Other reaction(s): Other (See Comments), Stopped breathing Other reaction(s): Shortness Of Breath Shortness of breath with benadryl    Diphenhydramine-Zinc Acetate Shortness Of Breath   Atorvastatin Other (See Comments)    Other reaction(s): Muscle pain, Other (See Comments), Unknown Elevated CK      Fluvastatin Other (See Comments)    Other reaction(s): Other (See Comments) myalgias   Lisinopril     Other reaction(s): Hyperkalemia, Other (See Comments)   Metformin And Related     Other reaction(s): Other Renal function   Piperacillin-Tazobactam In Dex Rash    Other reaction(s): Rash Other reaction(s): Rash    Diphenhydramine     Other reaction(s): Low blood pressure   Other Other (See Comments)    Lescol      Outpatient Medications Prior to Visit  Medication Sig Dispense Refill   buPROPion (WELLBUTRIN XL) 150 MG 24 hr tablet Take 150 mg by mouth every morning.     ciprofloxacin (CIPRO) 500 MG tablet Take 1 tablet (500 mg total) by mouth 2 (two) times daily. 60 tablet 1  clopidogrel (PLAVIX) 75 MG tablet Take 75 mg by mouth daily.     DULoxetine (CYMBALTA) 60 MG capsule Take 60 mg by mouth daily.     insulin detemir (LEVEMIR) 100 UNIT/ML injection Inject 0.2 mLs (20 Units total) into the skin daily. 10 mL 11   Ipratropium-Albuterol (COMBIVENT RESPIMAT) 20-100 MCG/ACT AERS respimat Inhale 2 puffs into the lungs every 6 (six) hours.     isosorbide mononitrate (IMDUR) 30 MG 24 hr tablet Take 30 mg by mouth daily.     ketorolac (ACULAR) 0.5 % ophthalmic solution Place 1 drop into the left eye daily.     liraglutide (VICTOZA) 18 MG/3ML SOPN Inject 1.8 mg into the skin at bedtime.      magnesium oxide (MAG-OX) 400 MG tablet Take 400 mg by mouth 2 (two) times daily.     metoprolol succinate (TOPROL-XL) 25 MG 24 hr tablet Take 2 tablets (50 mg total) by mouth daily. Take with or immediately following a meal.     nitroGLYCERIN (NITROSTAT) 0.4 MG SL tablet Place 0.4 mg under the tongue every 5 (five) minutes as needed for chest pain.     omega-3 acid ethyl esters (LOVAZA) 1 g capsule Take 1 g by mouth daily.     pantoprazole (PROTONIX) 40 MG tablet Take 1 tablet (40 mg total) by mouth at bedtime.     rosuvastatin (CRESTOR) 20 MG tablet Take 20 mg by mouth daily.     tamsulosin (FLOMAX) 0.4 MG CAPS capsule Take 1 capsule (0.4 mg total) by mouth daily. 30 capsule 0   vitamin B-12 (CYANOCOBALAMIN) 1000 MCG tablet Take 1,000 mcg by mouth daily.     No facility-administered medications prior to visit.     Social History   Socioeconomic History   Marital status: Married    Spouse name: Not on file   Number of children: Not on file   Years of education: Not on file   Highest education level: Not on file  Occupational History   Not on file  Tobacco Use   Smoking status: Never   Smokeless tobacco: Never  Vaping Use   Vaping Use: Never used  Substance and Sexual Activity   Alcohol use: Not Currently   Drug use: Never   Sexual activity: Not Currently  Other Topics Concern   Not on file  Social History Narrative   Not on file   Social Determinants of Health   Financial Resource Strain: Not on file  Food Insecurity: Not on file  Transportation Needs: Not on file  Physical Activity: Not on file  Stress: Not on file  Social Connections: Not on file  Intimate Partner Violence: Not on file      Review of Systems    All other ros negative   Objective:    BP 106/71   Pulse 88  Nursing note and vital signs reviewed.  Physical Exam General/constitutional: no distress, pleasant; in wheel chair HEENT: Normocephalic, PER, Conj Clear, EOMI, Oropharynx clear Neck  supple CV: rrr no mrg Lungs: clear to auscultation, normal respiratory effort Abd: Soft, Nontender Ext: no edema Skin: No Rash Neuro: symmetric bilateral LE strength 4-5/5; intact touch sensation  MSK: no peripheral joint swelling/tenderness/warmth; back spines nontender -- lumbar incision no fluctuance/dehiscence/tenderness/erythema     Labs: Lab Results  Component Value Date   WBC 4.1 10/17/2020   HGB 9.5 (L) 10/17/2020   HCT 30.4 (L) 10/17/2020   MCV 97.7 10/17/2020   PLT 170 59/16/3846   Last metabolic panel  Lab Results  Component Value Date   GLUCOSE 216 (H) 10/17/2020   NA 133 (L) 10/17/2020   K 4.2 10/17/2020   CL 100 10/17/2020   CO2 22 10/17/2020   BUN 35 (H) 10/17/2020   CREATININE 2.28 (H) 10/17/2020   GFRNONAA 28 (L) 10/17/2020   CALCIUM 9.3 10/17/2020   PHOS 3.1 10/08/2020   PROT 6.8 10/05/2020   ALBUMIN 2.4 (L) 10/08/2020   BILITOT 0.7 10/05/2020   ALKPHOS 82 10/05/2020   AST 21 10/05/2020   ALT 18 10/05/2020   ANIONGAP 11 10/17/2020   . Micro:  Serology:  Imaging:  Assessment & Plan:   Problem List Items Addressed This Visit   None     No orders of the defined types were placed in this encounter.    Serratia bacteremia S/p on 8/30 L2/3 laminectomy with anterior interbody device and posterior L2-L5 instrumentation; removal previous rod L4-5 Clinical herpes and candida stomatitis Genital diaper rash with candidiasis Back heat rash   85 yo male dm2, ckd3, htn, osa, prior lumbar instrumentation, admitted 8/30 for semielective lumbar decompression surgery with new instrumentation/removal old L4-5 rod, course complicated by nosocomial serratia bacteremia unclear source, clinical hsv stomatitis, metabolic encephalopathy, and a rash. He is here for hospital f/u first visit 10/26/2020   Oral lesions appear to look like hsv stomatitis vs candida He has genital rash (wearing depend-diapers) which appear to be moisture related with also feature of  candida Body rash only in the back appears to be heat rash not hsv and not bacterial folliculitis. Ddx yeast folliculitis Mentation more consistent with delirium but clinically doesn't appear to be hsv encephalitis.    Serratia bacteremia 9/6 concerning for surgical site occult involvement. Would treat longer. Plan 6 weeks iv/oral. For now would keep iv and transition to oral cipro outpatient     ---------- 9/15 assessment Rash in all area continuing to improve on valtrex/fluconazole. Patient eating better and less oral pain today, and more alert Surgical site continues to look well without obvious gross evidence of infection  10/7 assessment Unclear why cipro order didn't translate to nursing home. Patient only got it until 9/30 rather than the 4 weeks planned Initially concerned for seeding of the surgical site from serratia bacteremia of unclear source. He seems to be doing ok off any abx for the past week  Will get crp and repeat evaluation in 3 weeks prior to deciding on further abx  -cbc, cmp, crp today -see me in 3-4 weeks   Follow-up: Return in about 4 weeks (around 11/23/2020).      Jabier Mutton, [place] [facility] [phone]  pager   [phone] cell 10/26/2020, 9:58 AM

## 2020-10-27 LAB — COMPLETE METABOLIC PANEL WITH GFR
AG Ratio: 0.9 (calc) — ABNORMAL LOW (ref 1.0–2.5)
ALT: 10 U/L (ref 9–46)
AST: 17 U/L (ref 10–35)
Albumin: 3.5 g/dL — ABNORMAL LOW (ref 3.6–5.1)
Alkaline phosphatase (APISO): 112 U/L (ref 35–144)
BUN/Creatinine Ratio: 19 (calc) (ref 6–22)
BUN: 42 mg/dL — ABNORMAL HIGH (ref 7–25)
CO2: 26 mmol/L (ref 20–32)
Calcium: 9.2 mg/dL (ref 8.6–10.3)
Chloride: 96 mmol/L — ABNORMAL LOW (ref 98–110)
Creat: 2.18 mg/dL — ABNORMAL HIGH (ref 0.70–1.22)
Globulin: 3.9 g/dL (calc) — ABNORMAL HIGH (ref 1.9–3.7)
Glucose, Bld: 185 mg/dL — ABNORMAL HIGH (ref 65–99)
Potassium: 4.8 mmol/L (ref 3.5–5.3)
Sodium: 131 mmol/L — ABNORMAL LOW (ref 135–146)
Total Bilirubin: 0.3 mg/dL (ref 0.2–1.2)
Total Protein: 7.4 g/dL (ref 6.1–8.1)
eGFR: 29 mL/min/{1.73_m2} — ABNORMAL LOW (ref >=60)

## 2020-10-27 LAB — CBC
HCT: 30.5 % — ABNORMAL LOW (ref 38.5–50.0)
Hemoglobin: 9.7 g/dL — ABNORMAL LOW (ref 13.2–17.1)
MCH: 29.5 pg (ref 27.0–33.0)
MCHC: 31.8 g/dL — ABNORMAL LOW (ref 32.0–36.0)
MCV: 92.7 fL (ref 80.0–100.0)
MPV: 10.9 fL (ref 7.5–12.5)
Platelets: 210 10*3/uL (ref 140–400)
RBC: 3.29 10*6/uL — ABNORMAL LOW (ref 4.20–5.80)
RDW: 14.5 % (ref 11.0–15.0)
WBC: 5.6 10*3/uL (ref 3.8–10.8)

## 2020-10-27 LAB — C-REACTIVE PROTEIN: CRP: 30.4 mg/L — ABNORMAL HIGH (ref <8.0)

## 2020-11-19 ENCOUNTER — Ambulatory Visit (INDEPENDENT_AMBULATORY_CARE_PROVIDER_SITE_OTHER): Payer: Medicare Other | Admitting: Internal Medicine

## 2020-11-19 ENCOUNTER — Encounter: Payer: Self-pay | Admitting: Internal Medicine

## 2020-11-19 ENCOUNTER — Other Ambulatory Visit: Payer: Self-pay

## 2020-11-19 VITALS — BP 101/63 | HR 105 | Temp 98.0°F

## 2020-11-19 DIAGNOSIS — R7881 Bacteremia: Secondary | ICD-10-CM

## 2020-11-19 DIAGNOSIS — Z9889 Other specified postprocedural states: Secondary | ICD-10-CM

## 2020-11-19 NOTE — Progress Notes (Signed)
[facility]  Patient Active Problem List   Diagnosis Date Noted   Surgery, elective    Serratia septicemia ([place])    Herpes stomatitis    Candidal stomatitis    Diaper rash    Heat rash    S/P lumbar spine operation    Fixation hardware in spine    Delirium    Hypoxia    Pleural effusion    Diabetes mellitus without complication (HCC)    Coronary artery disease    CKD (chronic kidney disease)    SIRS (systemic inflammatory response syndrome) (HCC)    Lumbar spinal stenosis 09/18/2020   Sleep apnea 2012      Subjective:    Patient ID: Ricky Nelson, male    DOB: Sep 26, 1935, 85 y.o.   MRN: [id number]  Chief Complaint  Patient presents with   Follow-up    Serratia infection    HPI:  Ricky Nelson is a 85 y.o. male here for follow up of serratia bacteremia in the setting of same admission for semielective lumbar instrumentation  Admitted 8/30; hardware exchanged Hospital course complicated by nosocomial serratia bacteremia Also had clinical hsv stomatitis  Finish ertapenem 9/14-9/22 (was on cefepime 9/7-14). My plan was to treat 6 weeks concerning for the surgical site involvement (although there was never any gross evidence of soft tissue infection of the surgical site)  However, he only got 8 more days of cipro until 9/30 after his discharge to snf  He reports stable lower back pain Stable bilateral weakness --- doing physical therapy Will see his neurosurgeon next week  No fever, chill, n, v, diarrhea Appetite not the best, but eating adequately per his report  Oral rash resolved  ---------- 10/31 id f/u Patient last saw me for first time hospital follow up. He was taken off abx earlier than then planned 6 weeks treatment He didn't have worsening back pain or sign of sepsis I obtained labs which showed no leukocytosis; but his crp was 30.4 which was high He denies fever, chill, or again worsening back pain He  remains off abx. He is still at [facility]. I reviewed their medication list  Of note, while in the hospital patient has cold sore but that had resolved. He still doesn't eat as well and has been loosing weight His pain in the back is moderate and stable  He saw his NSG team 2 weeks ago who is happy with how the wound looks  Leg strength improving, fww and walk with someone for 75 feet  Allergies  Allergen Reactions   Allergy Relief [Chlorpheniramine Maleate] Shortness Of Breath   Diazepam     Other reaction(s): Hypotension, Low blood pressure, Other (See Comments)   Diphenhydramine Hcl Shortness Of Breath    Other reaction(s): Other (See Comments), Stopped breathing Other reaction(s): Shortness Of Breath Shortness of breath with benadryl    Diphenhydramine-Zinc Acetate Shortness Of Breath   Atorvastatin Other (See Comments)    Other reaction(s): Muscle pain, Other (See Comments), Unknown Elevated CK      Fluvastatin Other (See Comments)    Other reaction(s): Other (See Comments) myalgias   Lisinopril     Other reaction(s): Hyperkalemia, Other (See Comments)   Metformin And Related     Other reaction(s): Other Renal function   Piperacillin-Tazobactam In Dex Rash    Other reaction(s): Rash Other reaction(s): Rash    Diphenhydramine     Other reaction(s): Low blood  pressure   Other Other (See Comments)    Lescol      Outpatient Medications Prior to Visit  Medication Sig Dispense Refill   buPROPion (WELLBUTRIN XL) 150 MG 24 hr tablet Take 150 mg by mouth every morning.     clopidogrel (PLAVIX) 75 MG tablet Take 75 mg by mouth daily.     DULoxetine (CYMBALTA) 60 MG capsule Take 60 mg by mouth daily.     insulin detemir (LEVEMIR) 100 UNIT/ML injection Inject 0.2 mLs (20 Units total) into the skin daily. 10 mL 11   Ipratropium-Albuterol (COMBIVENT RESPIMAT) 20-100 MCG/ACT AERS respimat Inhale 2 puffs into the lungs every 6 (six) hours.     isosorbide mononitrate  (IMDUR) 30 MG 24 hr tablet Take 30 mg by mouth daily.     ketorolac (ACULAR) 0.5 % ophthalmic solution Place 1 drop into the left eye daily.     liraglutide (VICTOZA) 18 MG/3ML SOPN Inject 1.8 mg into the skin at bedtime.     magnesium oxide (MAG-OX) 400 MG tablet Take 400 mg by mouth 2 (two) times daily.     metoprolol succinate (TOPROL-XL) 25 MG 24 hr tablet Take 2 tablets (50 mg total) by mouth daily. Take with or immediately following a meal.     nitroGLYCERIN (NITROSTAT) 0.4 MG SL tablet Place 0.4 mg under the tongue every 5 (five) minutes as needed for chest pain.     omega-3 acid ethyl esters (LOVAZA) 1 g capsule Take 1 g by mouth daily.     pantoprazole (PROTONIX) 40 MG tablet Take 1 tablet (40 mg total) by mouth at bedtime.     rosuvastatin (CRESTOR) 20 MG tablet Take 20 mg by mouth daily.     tamsulosin (FLOMAX) 0.4 MG CAPS capsule Take 1 capsule (0.4 mg total) by mouth daily. 30 capsule 0   vitamin B-12 (CYANOCOBALAMIN) 1000 MCG tablet Take 1,000 mcg by mouth daily.     No facility-administered medications prior to visit.     Social History   Socioeconomic History   Marital status: Married    Spouse name: Not on file   Number of children: Not on file   Years of education: Not on file   Highest education level: Not on file  Occupational History   Not on file  Tobacco Use   Smoking status: Never   Smokeless tobacco: Never  Vaping Use   Vaping Use: Never used  Substance and Sexual Activity   Alcohol use: Not Currently   Drug use: Never   Sexual activity: Not Currently  Other Topics Concern   Not on file  Social History Narrative   Not on file   Social Determinants of Health   Financial Resource Strain: Not on file  Food Insecurity: Not on file  Transportation Needs: Not on file  Physical Activity: Not on file  Stress: Not on file  Social Connections: Not on file  Intimate Partner Violence: Not on file      Review of Systems    All other ros  negative   Objective:    There were no vitals taken for this visit. Nursing note and vital signs reviewed.  Physical Exam General/constitutional: no distress, pleasant HEENT: Normocephalic, PER, Conj Clear, EOMI, Oropharynx clear Neck supple CV: rrr no mrg Lungs: clear to auscultation, normal respiratory effort Abd: Soft, Nontender Ext: no edema Skin: No Rash  Neuro: symmetric bilateral LE strength 4-5/5; intact touch sensation  MSK: back spines nontender -- lumbar incision no fluctuance, dehiscence,  tenderness, or erythema     Labs: Lab Results  Component Value Date   WBC 5.6 10/26/2020   HGB 9.7 (L) 10/26/2020   HCT 30.5 (L) 10/26/2020   MCV 92.7 10/26/2020   PLT 210 37/54/3606   Last metabolic panel Lab Results  Component Value Date   GLUCOSE 185 (H) 10/26/2020   NA 131 (L) 10/26/2020   K 4.8 10/26/2020   CL 96 (L) 10/26/2020   CO2 26 10/26/2020   BUN 42 (H) 10/26/2020   CREATININE 2.18 (H) 10/26/2020   EGFR 29 (L) 10/26/2020   GFRNONAA 28 (L) 10/17/2020   CALCIUM 9.2 10/26/2020   PHOS 3.1 10/08/2020   PROT 7.4 10/26/2020   ALBUMIN 2.4 (L) 10/08/2020   BILITOT 0.3 10/26/2020   ALKPHOS 82 10/05/2020   AST 17 10/26/2020   ALT 10 10/26/2020   ANIONGAP 11 10/17/2020   . Micro:  Serology:  Imaging:  Assessment & Plan:   Problem List Items Addressed This Visit   None     No orders of the defined types were placed in this encounter.    Serratia bacteremia S/p on 8/30 L2/3 laminectomy with anterior interbody device and posterior L2-L5 instrumentation; removal previous rod L4-5 Clinical herpes and candida stomatitis Genital diaper rash with candidiasis Back heat rash   85 yo male dm2, ckd3, htn, osa, prior lumbar instrumentation, admitted 8/30 for semielective lumbar decompression surgery with new instrumentation/removal old L4-5 rod, course complicated by nosocomial serratia bacteremia unclear source, clinical hsv stomatitis, metabolic  encephalopathy, and a rash. He is here for hospital f/u first visit 10/26/2020   Oral lesions appear to look like hsv stomatitis vs candida He has genital rash (wearing depend-diapers) which appear to be moisture related with also feature of candida Body rash only in the back appears to be heat rash not hsv and not bacterial folliculitis. Ddx yeast folliculitis Mentation more consistent with delirium but clinically doesn't appear to be hsv encephalitis.    Serratia bacteremia 9/6 concerning for surgical site occult involvement. Would treat longer. Plan 6 weeks iv/oral. For now would keep iv and transition to oral cipro outpatient     ---------- 9/15 assessment Rash in all area continuing to improve on valtrex/fluconazole. Patient eating better and less oral pain today, and more alert Surgical site continues to look well without obvious gross evidence of infection  10/7 assessment Unclear why cipro order didn't translate to nursing home. Patient only got it until 9/30 rather than the 4 weeks planned Initially concerned for seeding of the surgical site from serratia bacteremia of unclear source. He seems to be doing ok off any abx for the past week  Will get crp and repeat evaluation in 3 weeks prior to deciding on further abx  10/31 assessment Clinically no obvious sign of untreated infection Back pain stable likely baseline Appetite/weight loss multifactorial will need ongoing monitoring. Family suggest memory issue... advise to see pcp for further evaluation   -repeat crp; if much increased will revisit with patient -no need for f/u -monitor for sign of infection   Follow-up: No follow-ups on file.      Jabier Mutton, [place] [facility] [phone]  pager   [phone] cell 11/19/2020, 9:43 AM

## 2020-11-19 NOTE — Patient Instructions (Addendum)
Labs today  Follow up as needed if back pain worsens or fever/chill   See pcp in 3 months for weight loss, poor appetite, and memory issue

## 2020-11-20 ENCOUNTER — Telehealth: Payer: Self-pay

## 2020-11-20 LAB — C-REACTIVE PROTEIN: CRP: 35 mg/L — ABNORMAL HIGH (ref <8.0)

## 2020-11-20 NOTE — Telephone Encounter (Signed)
-----   Message from Jabier Mutton, MD sent at 11/20/2020  9:51 AM EDT ----- I have responded to patient's son inquiry about the crp  Please schedule patient's follow up with me in around 4-6 weeks.  Thank you

## 2020-11-20 NOTE — Telephone Encounter (Signed)
I spoke with the patient's son and scheduled the patient for a follow up. Patient's son will let the facility know about patient's appointment. Carrington Olazabal T Brooks Sailors

## 2020-12-18 ENCOUNTER — Ambulatory Visit: Payer: Medicare Other | Admitting: Internal Medicine

## 2020-12-19 ENCOUNTER — Encounter: Payer: Self-pay | Admitting: Internal Medicine

## 2020-12-19 ENCOUNTER — Other Ambulatory Visit: Payer: Self-pay

## 2020-12-19 ENCOUNTER — Ambulatory Visit (INDEPENDENT_AMBULATORY_CARE_PROVIDER_SITE_OTHER): Payer: Medicare Other | Admitting: Internal Medicine

## 2020-12-19 VITALS — BP 100/64 | HR 89 | Temp 98.0°F | Resp 16

## 2020-12-19 DIAGNOSIS — R7881 Bacteremia: Secondary | ICD-10-CM | POA: Diagnosis present

## 2020-12-19 DIAGNOSIS — Z969 Presence of functional implant, unspecified: Secondary | ICD-10-CM | POA: Diagnosis not present

## 2020-12-19 NOTE — Progress Notes (Signed)
[facility]  Patient Active Problem List   Diagnosis Date Noted   Surgery, elective    Serratia septicemia ([place])    Herpes stomatitis    Candidal stomatitis    Diaper rash    Heat rash    S/P lumbar spine operation    Fixation hardware in spine    Delirium    Hypoxia    Pleural effusion    Diabetes mellitus without complication (HCC)    Coronary artery disease    CKD (chronic kidney disease)    SIRS (systemic inflammatory response syndrome) (HCC)    Lumbar spinal stenosis 09/18/2020   Sleep apnea 2012      Subjective:    Patient ID: Ricky Nelson, male    DOB: [birth year], 85 y.o.   MRN: [id number]  Chief Complaint  Patient presents with   Follow-up    Bacteremia - pt reports he feels fair.     HPI:  Ricky Nelson is a 85 y.o. male here for follow up of serratia bacteremia in the setting of same admission for semielective lumbar instrumentation  Admitted 8/30; hardware exchanged Hospital course complicated by nosocomial serratia bacteremia Also had clinical hsv stomatitis  Finish ertapenem 9/14-9/22 (was on cefepime 9/7-14). My plan was to treat 6 weeks concerning for the surgical site involvement (although there was never any gross evidence of soft tissue infection of the surgical site)  However, he only got 8 more days of cipro until 9/30 after his discharge to snf  He reports stable lower back pain Stable bilateral weakness --- doing physical therapy Will see his neurosurgeon next week  No fever, chill, n, v, diarrhea Appetite not the best, but eating adequately per his report  Oral rash resolved  ---------- 10/31 id f/u Patient last saw me for first time hospital follow up. He was taken off abx earlier than then planned 6 weeks treatment He didn't have worsening back pain or sign of sepsis I obtained labs which showed no leukocytosis; but his crp was 30.4 which was high He denies fever, chill, or again worsening  back pain He remains off abx. He is still at [facility]. I reviewed their medication list  Of note, while in the hospital patient has cold sore but that had resolved. He still doesn't eat as well and has been loosing weight His pain in the back is moderate and stable  He saw his NSG team 2 weeks ago who is happy with how the wound looks  Leg strength improving, fww and walk with someone for 75 feet   11/30 id f/u Patient with stable moderate back pain No f/c Appetite intact good; "ate the most" this thanksgiving in a while." Gained weight again since loosing weight prior to back surgery. Holding stable low 160 pounds No shooting pain to feet/numbness/tingling  Allergies  Allergen Reactions   Allergy Relief [Chlorpheniramine Maleate] Shortness Of Breath   Diazepam     Other reaction(s): Hypotension, Low blood pressure, Other (See Comments)   Diphenhydramine Hcl Shortness Of Breath    Other reaction(s): Other (See Comments), Stopped breathing Other reaction(s): Shortness Of Breath Shortness of breath with benadryl    Diphenhydramine-Zinc Acetate Shortness Of Breath   Atorvastatin Other (See Comments)    Other reaction(s): Muscle pain, Other (See Comments), Unknown Elevated CK      Fluvastatin Other (See Comments)    Other reaction(s): Other (See Comments) myalgias   Lisinopril  Other reaction(s): Hyperkalemia, Other (See Comments)   Metformin And Related     Other reaction(s): Other Renal function   Piperacillin-Tazobactam In Dex Rash    Other reaction(s): Rash Other reaction(s): Rash    Diphenhydramine     Other reaction(s): Low blood pressure   Other Other (See Comments)    Lescol      Outpatient Medications Prior to Visit  Medication Sig Dispense Refill   buPROPion (WELLBUTRIN XL) 150 MG 24 hr tablet Take 150 mg by mouth every morning.     clopidogrel (PLAVIX) 75 MG tablet Take 75 mg by mouth daily.     DULoxetine (CYMBALTA) 60 MG capsule Take 60  mg by mouth daily.     insulin detemir (LEVEMIR) 100 UNIT/ML injection Inject 0.2 mLs (20 Units total) into the skin daily. 10 mL 11   Ipratropium-Albuterol (COMBIVENT RESPIMAT) 20-100 MCG/ACT AERS respimat Inhale 2 puffs into the lungs every 6 (six) hours.     isosorbide mononitrate (IMDUR) 30 MG 24 hr tablet Take 30 mg by mouth daily.     ketorolac (ACULAR) 0.5 % ophthalmic solution Place 1 drop into the left eye daily.     liraglutide (VICTOZA) 18 MG/3ML SOPN Inject 1.8 mg into the skin at bedtime.     magnesium oxide (MAG-OX) 400 MG tablet Take 400 mg by mouth 2 (two) times daily.     metoprolol succinate (TOPROL-XL) 25 MG 24 hr tablet Take 2 tablets (50 mg total) by mouth daily. Take with or immediately following a meal.     nitroGLYCERIN (NITROSTAT) 0.4 MG SL tablet Place 0.4 mg under the tongue every 5 (five) minutes as needed for chest pain.     omega-3 acid ethyl esters (LOVAZA) 1 g capsule Take 1 g by mouth daily.     pantoprazole (PROTONIX) 40 MG tablet Take 1 tablet (40 mg total) by mouth at bedtime.     rosuvastatin (CRESTOR) 20 MG tablet Take 20 mg by mouth daily.     tamsulosin (FLOMAX) 0.4 MG CAPS capsule Take 1 capsule (0.4 mg total) by mouth daily. 30 capsule 0   traZODone (DESYREL) 50 MG tablet Take 50 mg by mouth at bedtime.     vitamin B-12 (CYANOCOBALAMIN) 1000 MCG tablet Take 1,000 mcg by mouth daily.     No facility-administered medications prior to visit.     Social History   Socioeconomic History   Marital status: Married    Spouse name: Not on file   Number of children: Not on file   Years of education: Not on file   Highest education level: Not on file  Occupational History   Not on file  Tobacco Use   Smoking status: Never   Smokeless tobacco: Never  Vaping Use   Vaping Use: Never used  Substance and Sexual Activity   Alcohol use: Not Currently   Drug use: Never   Sexual activity: Not Currently  Other Topics Concern   Not on file  Social History  Narrative   Not on file   Social Determinants of Health   Financial Resource Strain: Not on file  Food Insecurity: Not on file  Transportation Needs: Not on file  Physical Activity: Not on file  Stress: Not on file  Social Connections: Not on file  Intimate Partner Violence: Not on file      Review of Systems    All other ros negative   Objective:    BP 100/64   Pulse 89   Temp  98 F (36.7 C) (Oral)   Resp 16   SpO2 98%  Nursing note and vital signs reviewed.  Physical Exam General/constitutional: no distress, pleasant HEENT: Normocephalic, PER, Conj Clear, EOMI, Oropharynx clear Neck supple CV: rrr no mrg Lungs: clear to auscultation, normal respiratory effort Abd: Soft, Nontender Ext: no edema Skin: No Rash  Neuro: symmetric bilateral LE strength 4-5/5; intact touch sensation  MSK: back spines nontender -- lumbar incision no fluctuance, dehiscence, tenderness, or erythema     Labs: Lab Results  Component Value Date   WBC 5.6 10/26/2020   HGB 9.7 (L) 10/26/2020   HCT 30.5 (L) 10/26/2020   MCV 92.7 10/26/2020   PLT 210 89/38/1017   Last metabolic panel Lab Results  Component Value Date   GLUCOSE 185 (H) 10/26/2020   NA 131 (L) 10/26/2020   K 4.8 10/26/2020   CL 96 (L) 10/26/2020   CO2 26 10/26/2020   BUN 42 (H) 10/26/2020   CREATININE 2.18 (H) 10/26/2020   EGFR 29 (L) 10/26/2020   GFRNONAA 28 (L) 10/17/2020   CALCIUM 9.2 10/26/2020   PHOS 3.1 10/08/2020   PROT 7.4 10/26/2020   ALBUMIN 2.4 (L) 10/08/2020   BILITOT 0.3 10/26/2020   ALKPHOS 82 10/05/2020   AST 17 10/26/2020   ALT 10 10/26/2020   ANIONGAP 11 10/17/2020        Component Value Date/Time   CRP 35.0 (H) 11/19/2020 1018   CRP 30.4 (H) 10/26/2020 1019    Micro:  Serology:  Imaging:  Assessment & Plan:   Problem List Items Addressed This Visit   None Visit Diagnoses     Bacteremia    -  Primary   Presence of retained hardware             No orders of the  defined types were placed in this encounter.    Serratia bacteremia S/p on 8/30 L2/3 laminectomy with anterior interbody device and posterior L2-L5 instrumentation; removal previous rod L4-5 Clinical herpes and candida stomatitis Genital diaper rash with candidiasis Back heat rash   85 yo male dm2, ckd3, htn, osa, prior lumbar instrumentation, admitted 8/30 for semielective lumbar decompression surgery with new instrumentation/removal old L4-5 rod, course complicated by nosocomial serratia bacteremia unclear source, clinical hsv stomatitis, metabolic encephalopathy, and a rash. He is here for hospital f/u first visit 10/26/2020   Oral lesions appear to look like hsv stomatitis vs candida He has genital rash (wearing depend-diapers) which appear to be moisture related with also feature of candida Body rash only in the back appears to be heat rash not hsv and not bacterial folliculitis. Ddx yeast folliculitis Mentation more consistent with delirium but clinically doesn't appear to be hsv encephalitis.    Serratia bacteremia 9/6 concerning for surgical site occult involvement. Would treat longer. Plan 6 weeks iv/oral. For now would keep iv and transition to oral cipro outpatient     ---------- 9/15 assessment Rash in all area continuing to improve on valtrex/fluconazole. Patient eating better and less oral pain today, and more alert Surgical site continues to look well without obvious gross evidence of infection  10/7 assessment Unclear why cipro order didn't translate to nursing home. Patient only got it until 9/30 rather than the 4 weeks planned Initially concerned for seeding of the surgical site from serratia bacteremia of unclear source. He seems to be doing ok off any abx for the past week  Will get crp and repeat evaluation in 3 weeks prior to deciding on further  abx  10/31 assessment Clinically no obvious sign of untreated infection Back pain stable likely  baseline Appetite/weight loss multifactorial will need ongoing monitoring. Family suggest memory issue... advise to see pcp for further evaluation   11/30 assessment Continue to do well clinically with no sign of lower back infection, 2 months off abx Again initial sepsis was serratia bsi within 24 hours hardware placement, so some concern for hardware involvement, but so far doesn't appear so   -follow up as needed if sign of infection   Follow-up: No follow-ups on file.      Jabier Mutton, [facility] [phone]  pager   [phone] cell 12/19/2020, 10:32 AM

## 2021-01-29 ENCOUNTER — Ambulatory Visit: Payer: Medicare Other | Admitting: Internal Medicine

## 2022-12-10 IMAGING — CT CT HEAD W/O CM
4 of 8 series · 17 of 47 positions shown, 18 images · non-contrast
Comparison: None.

CLINICAL DATA: Neck trauma, facial trauma

EXAM:
CT HEAD WITHOUT CONTRAST
CT CERVICAL SPINE WITHOUT CONTRAST
TECHNIQUE: Multidetector CT imaging of the head and cervical spine was
performed following the standard protocol without intravenous
contrast. Multiplanar CT image reconstructions of the cervical spine
were also generated.

[Series 3: head bone · axial · 0.43mm/px · z∈[-112,+28]mm · 8 of 84 slices shown]
[im 7/84  bone]
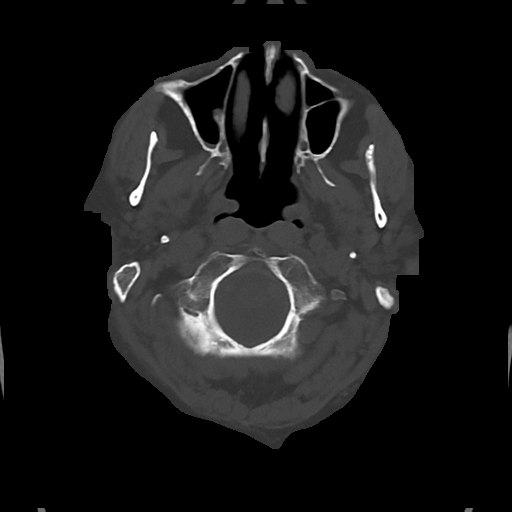
[im 20/84  bone]
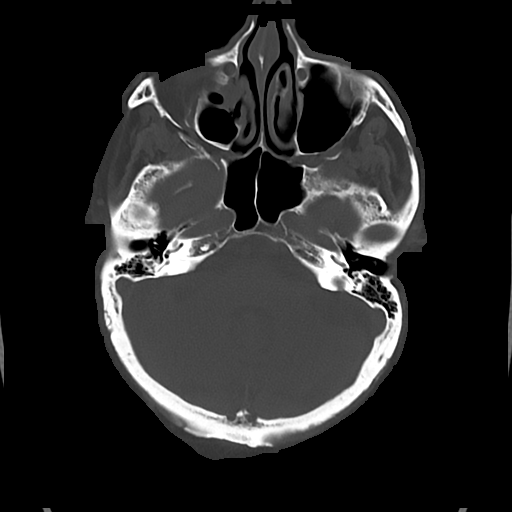
[im 26/84  bone]
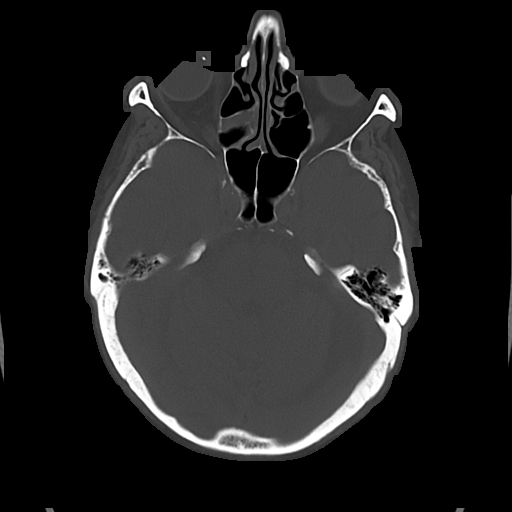
[im 39/84  bone]
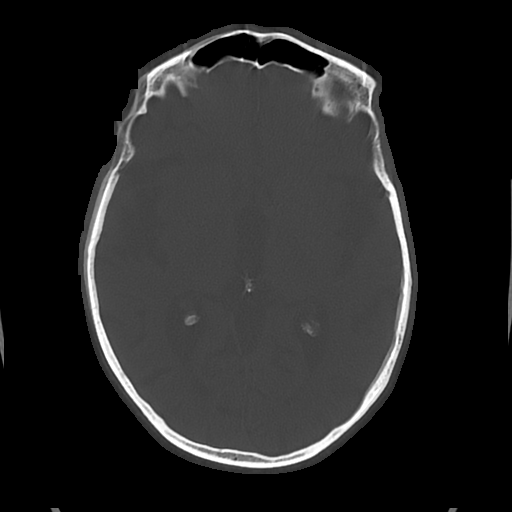
[im 45/84  bone]
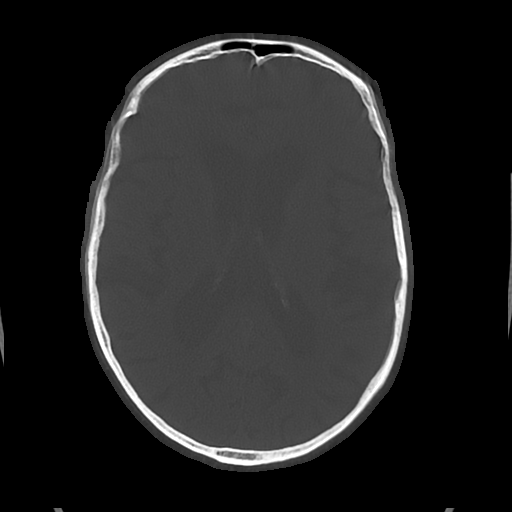
[im 58/84  bone]
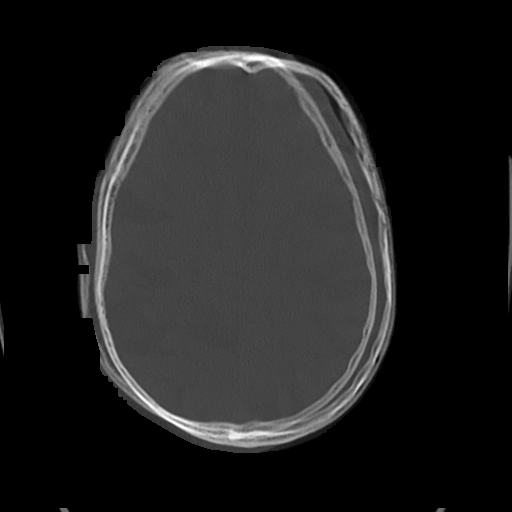
[im 64/84  bone]
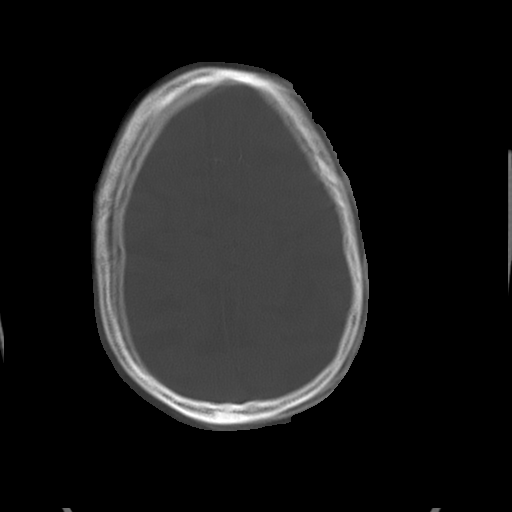
[im 77/84  bone]
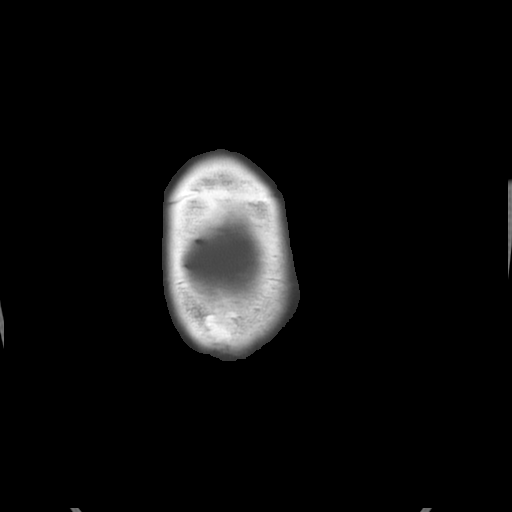

[Series 4: head wo · axial · 0.43mm/px · z∈[-94,+6]mm · 4 of 34 slices shown, 5 images]
[im 7/34  brain]
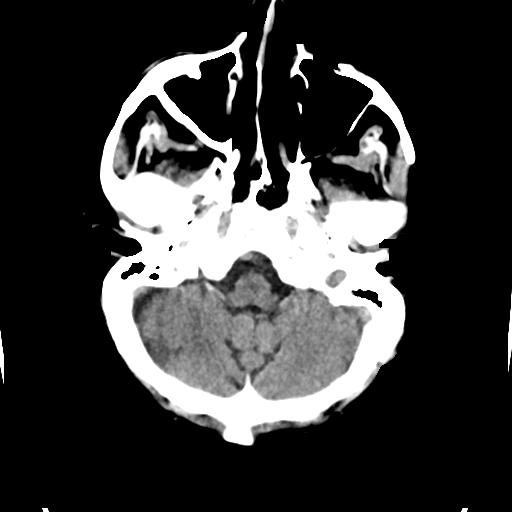
[im 7/34  bone]
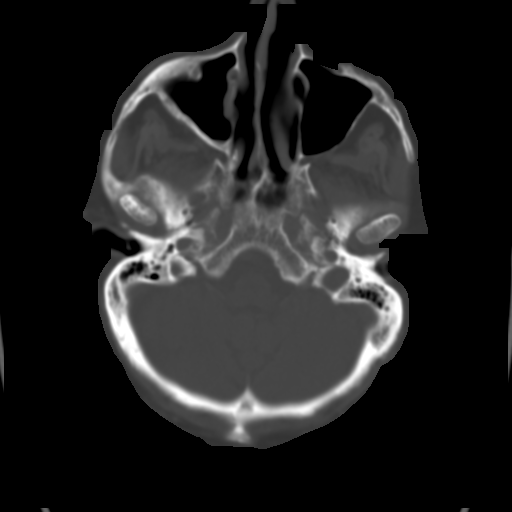
[im 14/34  brain]
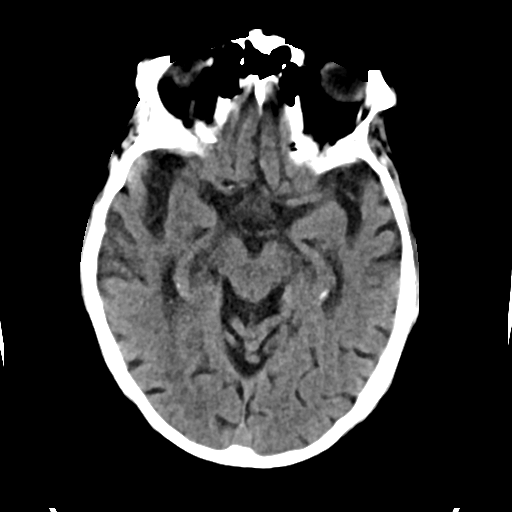
[im 20/34  brain]
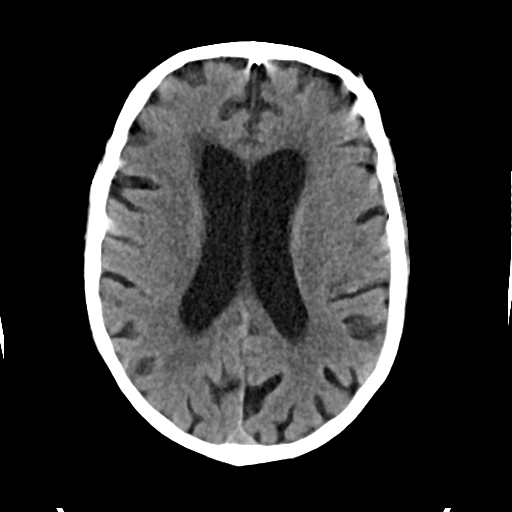
[im 27/34  brain]
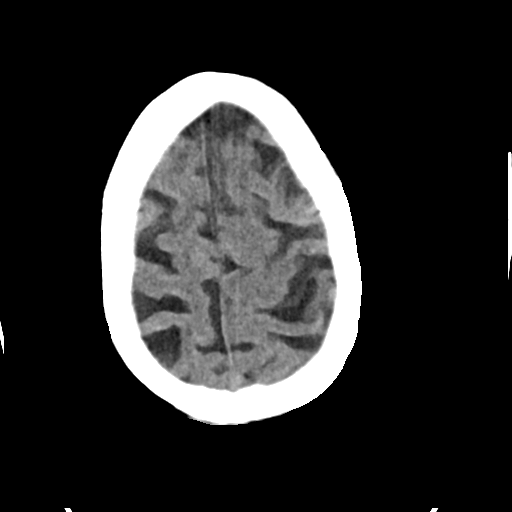

[Series 9: cor soft · coronal · 0.33mm/px · 3 of 72 slices shown]
[im 18/72  brain]
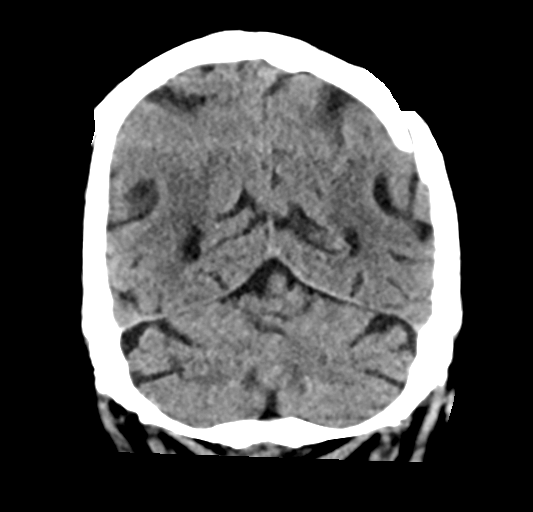
[im 36/72  brain]
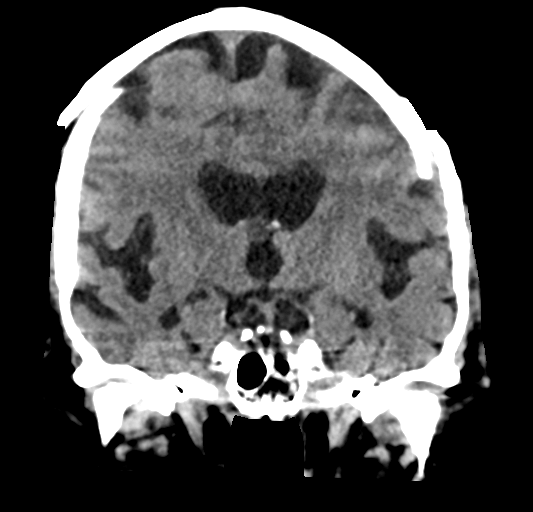
[im 54/72  brain]
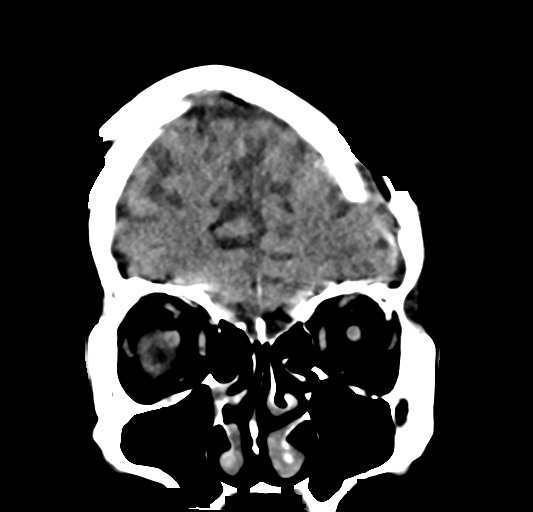

[Series 10: sag soft · sagittal · 0.33mm/px · 2 of 59 slices shown]
[im 20/59  brain]
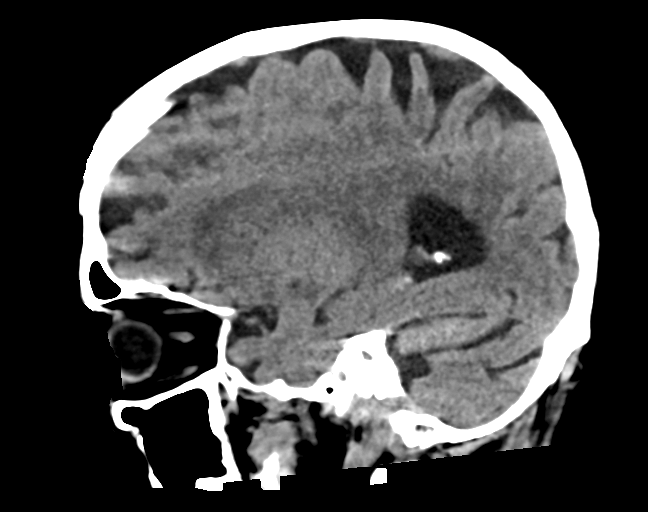
[im 39/59  brain]
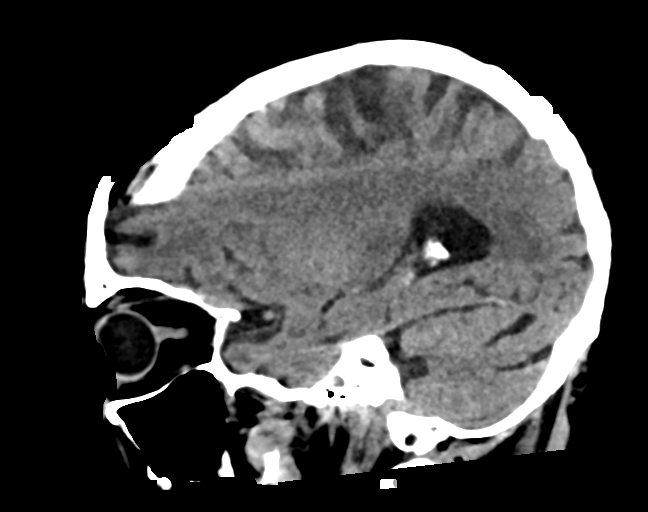

[17 of 47 positions shown; findings below may reference images not displayed]

FINDINGS: Brain: No evidence of acute infarction, hemorrhage, extra-axial
collection, ventriculomegaly, or mass effect. Generalized cerebral
atrophy. Periventricular white matter low attenuation likely
secondary to microangiopathy.

Vascular: Cerebrovascular atherosclerotic calcifications are noted.
No hyperdense vessels.

Skull: Negative for fracture or focal lesion.

Sinuses/Orbits: Visualized portions of the orbits are unremarkable.
Visualized portions of the paranasal sinuses are unremarkable.
Visualized portions of the mastoid air cells are unremarkable.

Other: None.

CT CERVICAL SPINE FINDINGS

Alignment: 4.5 mm anterolisthesis of C4 on C5 secondary to facet
disease.

Skull base and vertebrae: No acute fracture. No primary bone lesion
or focal pathologic process.

Soft tissues and spinal canal: No prevertebral fluid or swelling. No
visible canal hematoma.

Disc levels: Degenerative disease with disc height loss at C3-4 with
ankylosis across the disc space and ankylosis of the right posterior
elements. Degenerative disease with disc height loss at C4-5, C5-6,
C6-7, C7-T1 and T1-2. At C2-3 there is moderate left facet
arthropathy. At C3-4 there is severe right and moderate left
foraminal stenosis. At C4-5 there is moderate bilateral facet
arthropathy and bilateral foraminal stenosis. At C5-6 there is a
broad-based disc bulge, bilateral facet arthropathy, bilateral
uncovertebral degenerative changes, and bilateral foraminal
stenosis. At C6-7 there is a broad-based disc osteophyte complex
with bilateral uncovertebral degenerative changes and bilateral
foraminal stenosis.

Upper chest: Lung apices are clear.

Other: Bilateral carotid artery atherosclerosis.
IMPRESSION: 1. No acute intracranial pathology.
2.  No acute osseous injury of the cervical spine.
3. Cervical spine spondylosis as described above.

## 2022-12-10 IMAGING — CT CT CERVICAL SPINE W/O CM
3 of 4 series · 12 of 33 positions shown, 14 images · non-contrast
Comparison: None.

CLINICAL DATA: Neck trauma, facial trauma

EXAM:
CT HEAD WITHOUT CONTRAST
CT CERVICAL SPINE WITHOUT CONTRAST
TECHNIQUE: Multidetector CT imaging of the head and cervical spine was
performed following the standard protocol without intravenous
contrast. Multiplanar CT image reconstructions of the cervical spine
were also generated.

[Series 8: sag bone · sagittal · 0.25mm/px · 5 of 90 slices shown, 6 images]
[im 30/90  bone]
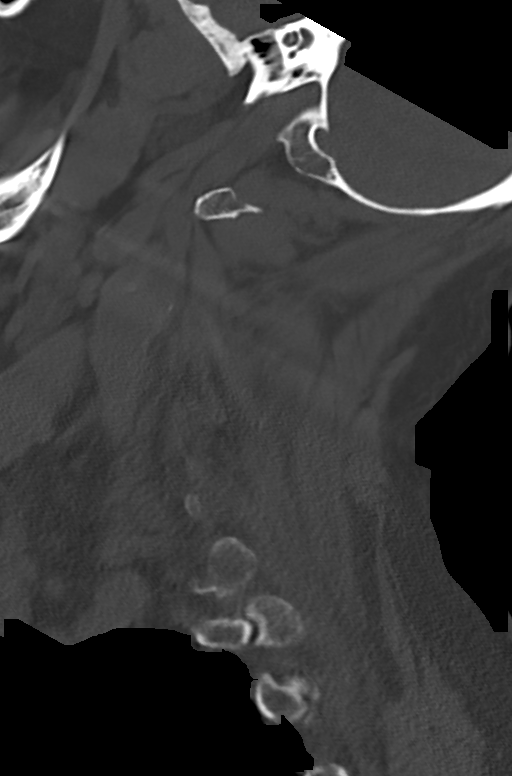
[im 38/90  bone]
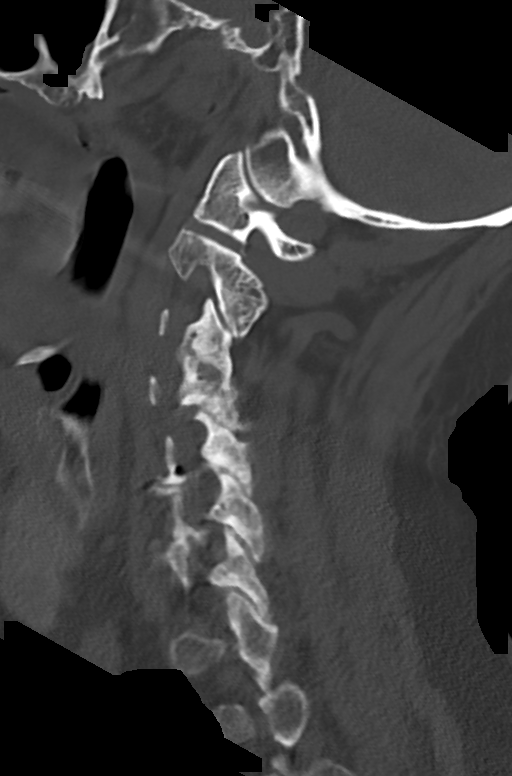
[im 45/90  soft-tissue]
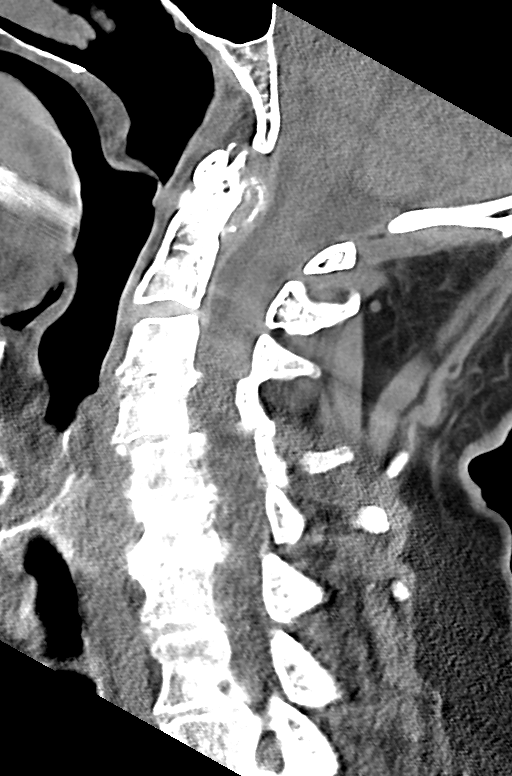
[im 45/90  bone]
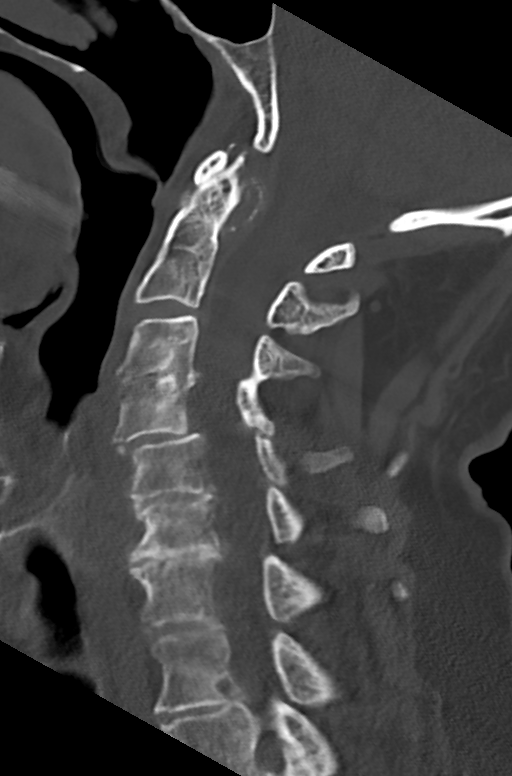
[im 52/90  bone]
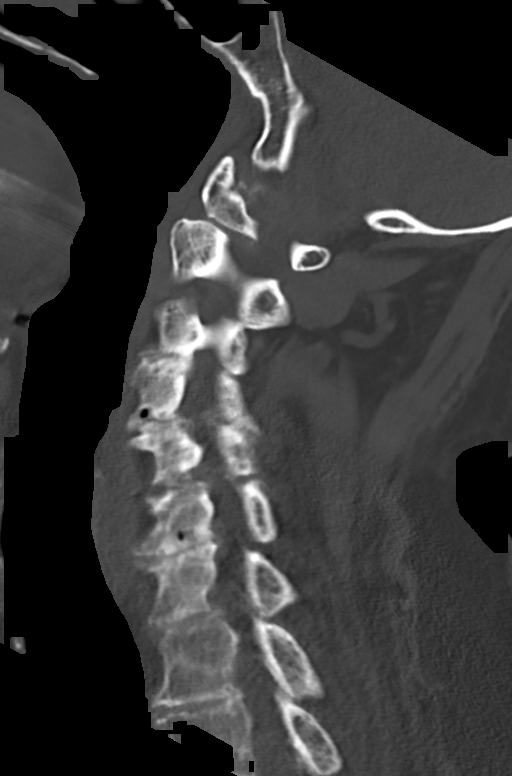
[im 60/90  bone]
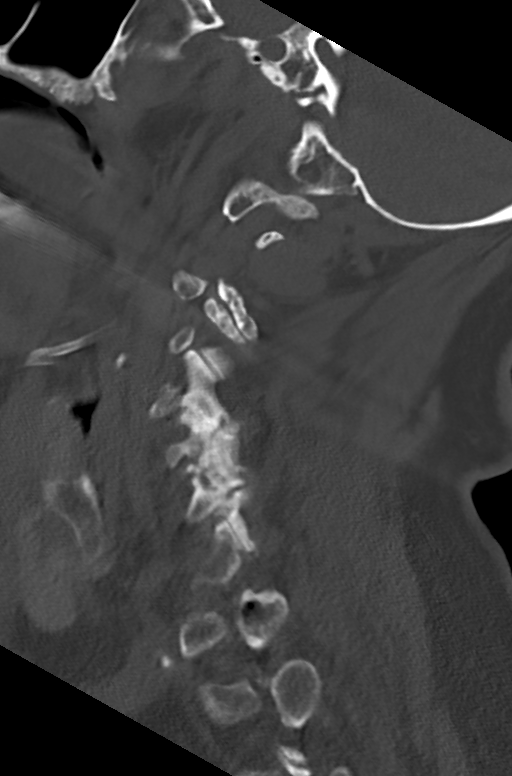

[Series 9: cor bone · coronal · 0.35mm/px · 3 of 62 slices shown]
[im 16/62  bone]
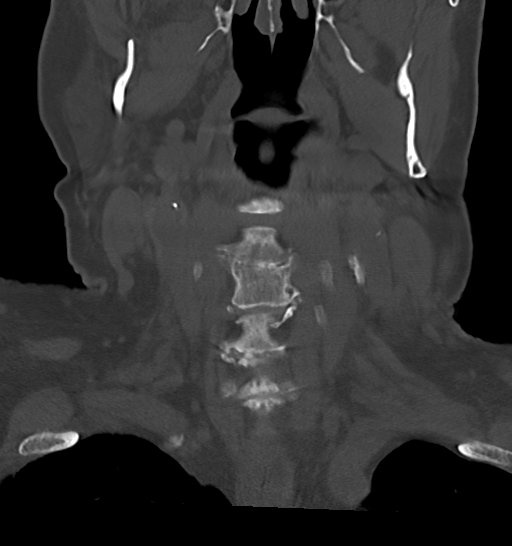
[im 26/62  bone]
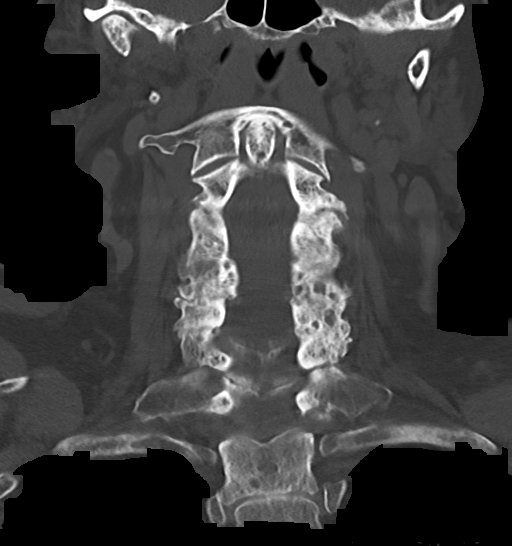
[im 36/62  bone]
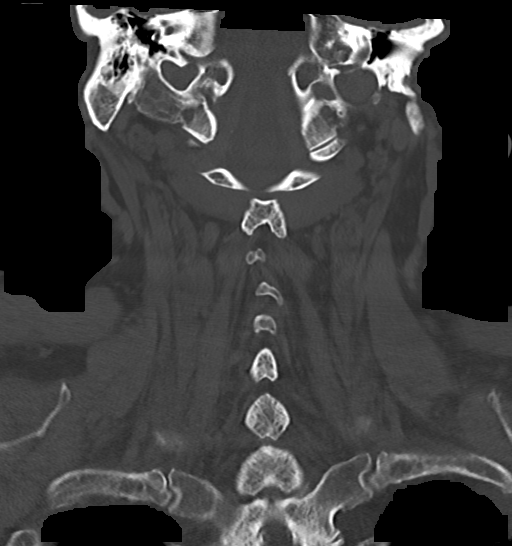

[Series 10: orthogonal axials · axial · 0.21mm/px · z∈[-233,-145]mm · 4 of 80 slices shown, 5 images]
[im 14/80  soft-tissue]
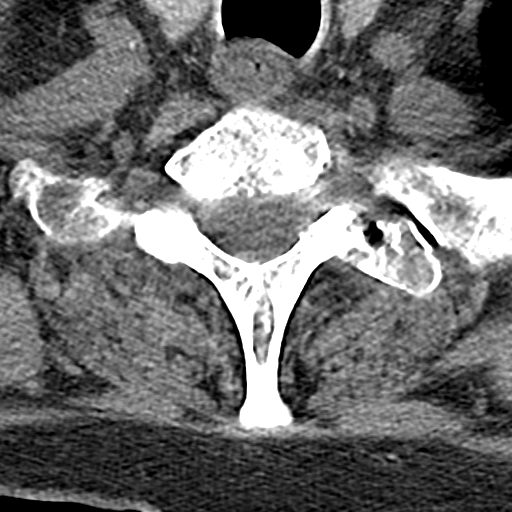
[im 14/80  bone]
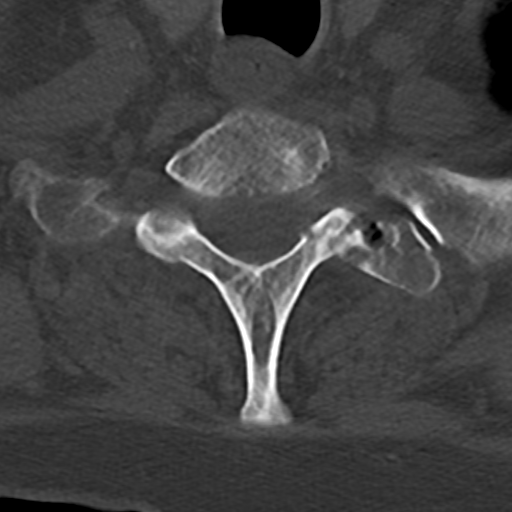
[im 27/80  bone]
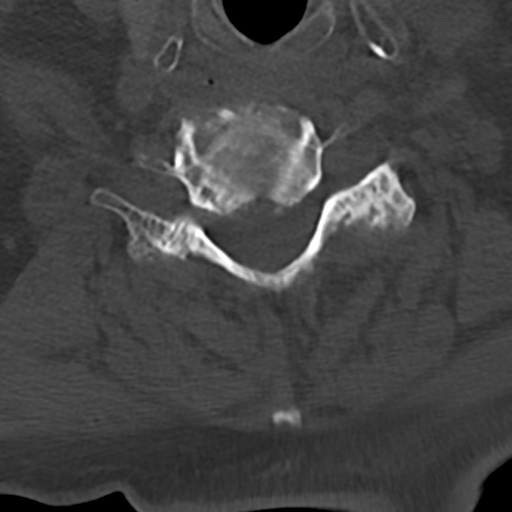
[im 53/80  bone]
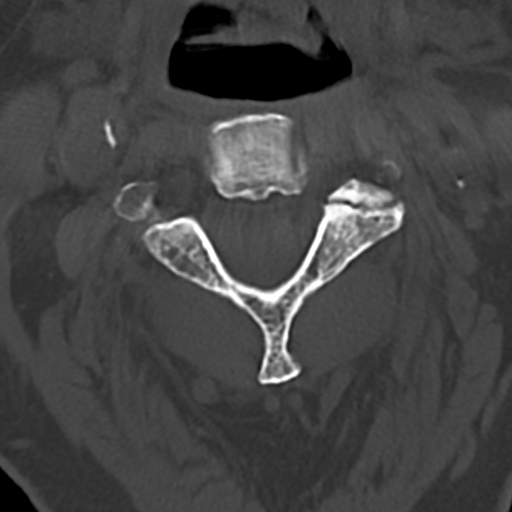
[im 66/80  bone]
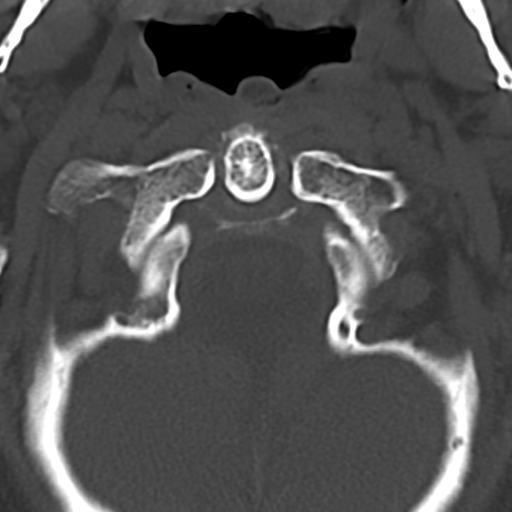

[12 of 33 positions shown; findings below may reference images not displayed]

FINDINGS: Brain: No evidence of acute infarction, hemorrhage, extra-axial
collection, ventriculomegaly, or mass effect. Generalized cerebral
atrophy. Periventricular white matter low attenuation likely
secondary to microangiopathy.

Vascular: Cerebrovascular atherosclerotic calcifications are noted.
No hyperdense vessels.

Skull: Negative for fracture or focal lesion.

Sinuses/Orbits: Visualized portions of the orbits are unremarkable.
Visualized portions of the paranasal sinuses are unremarkable.
Visualized portions of the mastoid air cells are unremarkable.

Other: None.

CT CERVICAL SPINE FINDINGS

Alignment: 4.5 mm anterolisthesis of C4 on C5 secondary to facet
disease.

Skull base and vertebrae: No acute fracture. No primary bone lesion
or focal pathologic process.

Soft tissues and spinal canal: No prevertebral fluid or swelling. No
visible canal hematoma.

Disc levels: Degenerative disease with disc height loss at C3-4 with
ankylosis across the disc space and ankylosis of the right posterior
elements. Degenerative disease with disc height loss at C4-5, C5-6,
C6-7, C7-T1 and T1-2. At C2-3 there is moderate left facet
arthropathy. At C3-4 there is severe right and moderate left
foraminal stenosis. At C4-5 there is moderate bilateral facet
arthropathy and bilateral foraminal stenosis. At C5-6 there is a
broad-based disc bulge, bilateral facet arthropathy, bilateral
uncovertebral degenerative changes, and bilateral foraminal
stenosis. At C6-7 there is a broad-based disc osteophyte complex
with bilateral uncovertebral degenerative changes and bilateral
foraminal stenosis.

Upper chest: Lung apices are clear.

Other: Bilateral carotid artery atherosclerosis.
IMPRESSION: 1. No acute intracranial pathology.
2.  No acute osseous injury of the cervical spine.
3. Cervical spine spondylosis as described above.
